# Patient Record
Sex: Male | Born: 1950 | Race: Black or African American | Hispanic: No | Marital: Married | Smoking: Former smoker
Health system: Southern US, Community
[De-identification: ages and names within clinical notes are randomized; demographics above are authoritative.]

## PROBLEM LIST (undated history)

## (undated) DIAGNOSIS — I351 Nonrheumatic aortic (valve) insufficiency: Secondary | ICD-10-CM

## (undated) DIAGNOSIS — I10 Essential (primary) hypertension: Secondary | ICD-10-CM

## (undated) DIAGNOSIS — R569 Unspecified convulsions: Secondary | ICD-10-CM

## (undated) DIAGNOSIS — B1920 Unspecified viral hepatitis C without hepatic coma: Secondary | ICD-10-CM

## (undated) DIAGNOSIS — R51 Headache: Secondary | ICD-10-CM

## (undated) DIAGNOSIS — I639 Cerebral infarction, unspecified: Secondary | ICD-10-CM

## (undated) DIAGNOSIS — I340 Nonrheumatic mitral (valve) insufficiency: Secondary | ICD-10-CM

## (undated) DIAGNOSIS — K8020 Calculus of gallbladder without cholecystitis without obstruction: Secondary | ICD-10-CM

## (undated) DIAGNOSIS — K219 Gastro-esophageal reflux disease without esophagitis: Secondary | ICD-10-CM

## (undated) DIAGNOSIS — F32A Depression, unspecified: Secondary | ICD-10-CM

## (undated) DIAGNOSIS — IMO0001 Reserved for inherently not codable concepts without codable children: Secondary | ICD-10-CM

## (undated) DIAGNOSIS — F329 Major depressive disorder, single episode, unspecified: Secondary | ICD-10-CM

## (undated) DIAGNOSIS — R519 Headache, unspecified: Secondary | ICD-10-CM

## (undated) DIAGNOSIS — I071 Rheumatic tricuspid insufficiency: Secondary | ICD-10-CM

## (undated) DIAGNOSIS — D1809 Hemangioma of other sites: Secondary | ICD-10-CM

## (undated) DIAGNOSIS — I5022 Chronic systolic (congestive) heart failure: Secondary | ICD-10-CM

## (undated) DIAGNOSIS — E46 Unspecified protein-calorie malnutrition: Secondary | ICD-10-CM

## (undated) DIAGNOSIS — Z953 Presence of xenogenic heart valve: Secondary | ICD-10-CM

## (undated) DIAGNOSIS — N200 Calculus of kidney: Secondary | ICD-10-CM

## (undated) HISTORY — PX: ROTATOR CUFF REPAIR: SHX139

## (undated) HISTORY — PX: OTHER SURGICAL HISTORY: SHX169

## (undated) HISTORY — PX: FRACTURE SURGERY: SHX138

## (undated) HISTORY — PX: LITHOTRIPSY: SUR834

---

## 2009-10-11 ENCOUNTER — Inpatient Hospital Stay (HOSPITAL_COMMUNITY): Admission: EM | Admit: 2009-10-11 | Discharge: 2009-10-14 | Payer: Self-pay | Admitting: Emergency Medicine

## 2010-11-26 LAB — CBC
HCT: 41.6 % (ref 39.0–52.0)
HCT: 42.4 % (ref 39.0–52.0)
Hemoglobin: 14.4 g/dL (ref 13.0–17.0)
MCHC: 33.3 g/dL (ref 30.0–36.0)
MCV: 94.3 fL (ref 78.0–100.0)
MCV: 95.6 fL (ref 78.0–100.0)
Platelets: 182 10*3/uL (ref 150–400)
RBC: 4.26 MIL/uL (ref 4.22–5.81)
RBC: 4.35 MIL/uL (ref 4.22–5.81)
RBC: 4.51 MIL/uL (ref 4.22–5.81)
RDW: 13.9 % (ref 11.5–15.5)
RDW: 14.4 % (ref 11.5–15.5)
WBC: 8.7 10*3/uL (ref 4.0–10.5)
WBC: 8.9 10*3/uL (ref 4.0–10.5)

## 2010-11-26 LAB — POCT I-STAT, CHEM 8
Calcium, Ion: 1.07 mmol/L — ABNORMAL LOW (ref 1.12–1.32)
Chloride: 104 mEq/L (ref 96–112)
Hemoglobin: 17 g/dL (ref 13.0–17.0)
Potassium: 3.2 mEq/L — ABNORMAL LOW (ref 3.5–5.1)

## 2010-11-26 LAB — BASIC METABOLIC PANEL
BUN: 7 mg/dL (ref 6–23)
CO2: 29 mEq/L (ref 19–32)
Calcium: 8.8 mg/dL (ref 8.4–10.5)
Calcium: 9.1 mg/dL (ref 8.4–10.5)
Chloride: 100 mEq/L (ref 96–112)
Chloride: 98 mEq/L (ref 96–112)
Creatinine, Ser: 0.76 mg/dL (ref 0.4–1.5)
Creatinine, Ser: 0.93 mg/dL (ref 0.4–1.5)
GFR calc Af Amer: >60 mL/min (ref >60)
Glucose, Bld: 120 mg/dL — ABNORMAL HIGH (ref 70–99)
Potassium: 3.2 mEq/L — ABNORMAL LOW (ref 3.5–5.1)
Potassium: 3.9 mEq/L (ref 3.5–5.1)
Sodium: 137 mEq/L (ref 135–145)

## 2011-03-20 ENCOUNTER — Emergency Department (HOSPITAL_COMMUNITY)
Admission: EM | Admit: 2011-03-20 | Discharge: 2011-03-20 | Disposition: A | Discharge status: Home / Self Care | Payer: Self-pay | Attending: Emergency Medicine | Admitting: Emergency Medicine

## 2011-03-20 ENCOUNTER — Emergency Department (HOSPITAL_COMMUNITY): Payer: Self-pay

## 2011-03-20 DIAGNOSIS — R220 Localized swelling, mass and lump, head: Secondary | ICD-10-CM | POA: Insufficient documentation

## 2011-03-20 DIAGNOSIS — R599 Enlarged lymph nodes, unspecified: Secondary | ICD-10-CM | POA: Insufficient documentation

## 2011-03-20 DIAGNOSIS — R221 Localized swelling, mass and lump, neck: Secondary | ICD-10-CM | POA: Insufficient documentation

## 2011-03-20 DIAGNOSIS — J029 Acute pharyngitis, unspecified: Secondary | ICD-10-CM | POA: Insufficient documentation

## 2011-03-20 DIAGNOSIS — I10 Essential (primary) hypertension: Secondary | ICD-10-CM | POA: Insufficient documentation

## 2011-03-20 LAB — DIFFERENTIAL
Basophils Relative: 0 % (ref 0–1)
Eosinophils Absolute: 0 10*3/uL (ref 0.0–0.7)
Eosinophils Relative: 0 % (ref 0–5)
Lymphs Abs: 1.9 10*3/uL (ref 0.7–4.0)

## 2011-03-20 LAB — CBC
MCHC: 34.5 g/dL (ref 30.0–36.0)
MCV: 87.8 fL (ref 78.0–100.0)
Platelets: 248 10*3/uL (ref 150–400)
WBC: 18.9 10*3/uL — ABNORMAL HIGH (ref 4.0–10.5)

## 2011-03-20 LAB — BASIC METABOLIC PANEL
BUN: 13 mg/dL (ref 6–23)
CO2: 30 mEq/L (ref 19–32)
GFR calc non Af Amer: >60 mL/min (ref >60)
Glucose, Bld: 89 mg/dL (ref 70–99)
Potassium: 4.1 mEq/L (ref 3.5–5.1)
Sodium: 139 mEq/L (ref 135–145)

## 2011-03-21 LAB — STREP A DNA PROBE: Group A Strep Probe: POSITIVE

## 2014-05-14 ENCOUNTER — Emergency Department (HOSPITAL_COMMUNITY): Payer: Self-pay

## 2014-05-14 ENCOUNTER — Emergency Department (HOSPITAL_COMMUNITY)
Admission: EM | Admit: 2014-05-14 | Discharge: 2014-05-14 | Discharge status: Against Medical Advice | Payer: Self-pay | Attending: Emergency Medicine | Admitting: Emergency Medicine

## 2014-05-14 ENCOUNTER — Encounter (HOSPITAL_COMMUNITY): Payer: Self-pay | Admitting: Emergency Medicine

## 2014-05-14 DIAGNOSIS — R0602 Shortness of breath: Secondary | ICD-10-CM | POA: Insufficient documentation

## 2014-05-14 DIAGNOSIS — R51 Headache: Secondary | ICD-10-CM | POA: Insufficient documentation

## 2014-05-14 DIAGNOSIS — R079 Chest pain, unspecified: Secondary | ICD-10-CM | POA: Insufficient documentation

## 2014-05-14 DIAGNOSIS — I10 Essential (primary) hypertension: Secondary | ICD-10-CM | POA: Insufficient documentation

## 2014-05-14 HISTORY — DX: Unspecified viral hepatitis C without hepatic coma: B19.20

## 2014-05-14 HISTORY — DX: Essential (primary) hypertension: I10

## 2014-05-14 LAB — BASIC METABOLIC PANEL
Anion gap: 16 — ABNORMAL HIGH (ref 5–15)
BUN: 11 mg/dL (ref 6–23)
CALCIUM: 9.1 mg/dL (ref 8.4–10.5)
CO2: 19 mEq/L (ref 19–32)
CREATININE: 1 mg/dL (ref 0.50–1.35)
Chloride: 106 mEq/L (ref 96–112)
GFR calc non Af Amer: 78 mL/min — ABNORMAL LOW (ref >90)
Glucose, Bld: 91 mg/dL (ref 70–99)
Potassium: 4.8 mEq/L (ref 3.7–5.3)
Sodium: 141 mEq/L (ref 137–147)

## 2014-05-14 LAB — I-STAT TROPONIN, ED: Troponin i, poc: 0.02 ng/mL (ref 0.00–0.08)

## 2014-05-14 LAB — CBC
HCT: 40 % (ref 39.0–52.0)
Hemoglobin: 13.9 g/dL (ref 13.0–17.0)
MCH: 30.8 pg (ref 26.0–34.0)
MCHC: 34.8 g/dL (ref 30.0–36.0)
MCV: 88.5 fL (ref 78.0–100.0)
PLATELETS: 232 10*3/uL (ref 150–400)
RBC: 4.52 MIL/uL (ref 4.22–5.81)
RDW: 14.7 % (ref 11.5–15.5)
WBC: 6.1 10*3/uL (ref 4.0–10.5)

## 2014-05-14 LAB — PRO B NATRIURETIC PEPTIDE: Pro B Natriuretic peptide (BNP): 4107 pg/mL — ABNORMAL HIGH (ref 0–125)

## 2014-05-14 NOTE — ED Notes (Signed)
Pt called again from triage, still no answer

## 2014-05-14 NOTE — ED Notes (Signed)
Pt called from triage, no answer 

## 2014-05-14 NOTE — ED Notes (Signed)
Pt c/o chest pain and shob times 2 days, esp when he lays down at night.  Pt states that last night he vomited.

## 2015-10-08 DIAGNOSIS — I639 Cerebral infarction, unspecified: Secondary | ICD-10-CM

## 2015-10-08 HISTORY — DX: Cerebral infarction, unspecified: I63.9

## 2015-10-15 ENCOUNTER — Emergency Department (HOSPITAL_COMMUNITY): Payer: Medicare Other

## 2015-10-15 ENCOUNTER — Inpatient Hospital Stay (HOSPITAL_COMMUNITY)
Admission: EM | Admit: 2015-10-15 | Discharge: 2015-10-18 | DRG: 291 | Disposition: A | Discharge status: Home / Self Care | Payer: Medicare Other | Attending: Internal Medicine | Admitting: Internal Medicine

## 2015-10-15 ENCOUNTER — Inpatient Hospital Stay (HOSPITAL_COMMUNITY): Payer: Medicare Other

## 2015-10-15 ENCOUNTER — Encounter (HOSPITAL_COMMUNITY): Payer: Self-pay | Admitting: Neurology

## 2015-10-15 DIAGNOSIS — J9601 Acute respiratory failure with hypoxia: Secondary | ICD-10-CM | POA: Diagnosis not present

## 2015-10-15 DIAGNOSIS — E785 Hyperlipidemia, unspecified: Secondary | ICD-10-CM | POA: Diagnosis not present

## 2015-10-15 DIAGNOSIS — G44029 Chronic cluster headache, not intractable: Secondary | ICD-10-CM | POA: Insufficient documentation

## 2015-10-15 DIAGNOSIS — I110 Hypertensive heart disease with heart failure: Principal | ICD-10-CM | POA: Diagnosis present

## 2015-10-15 DIAGNOSIS — E43 Unspecified severe protein-calorie malnutrition: Secondary | ICD-10-CM | POA: Diagnosis present

## 2015-10-15 DIAGNOSIS — Z7982 Long term (current) use of aspirin: Secondary | ICD-10-CM | POA: Diagnosis not present

## 2015-10-15 DIAGNOSIS — I639 Cerebral infarction, unspecified: Secondary | ICD-10-CM | POA: Diagnosis present

## 2015-10-15 DIAGNOSIS — I509 Heart failure, unspecified: Secondary | ICD-10-CM

## 2015-10-15 DIAGNOSIS — B182 Chronic viral hepatitis C: Secondary | ICD-10-CM | POA: Insufficient documentation

## 2015-10-15 DIAGNOSIS — Z23 Encounter for immunization: Secondary | ICD-10-CM

## 2015-10-15 DIAGNOSIS — I739 Peripheral vascular disease, unspecified: Secondary | ICD-10-CM | POA: Diagnosis not present

## 2015-10-15 DIAGNOSIS — B1920 Unspecified viral hepatitis C without hepatic coma: Secondary | ICD-10-CM | POA: Diagnosis present

## 2015-10-15 DIAGNOSIS — G44009 Cluster headache syndrome, unspecified, not intractable: Secondary | ICD-10-CM | POA: Diagnosis present

## 2015-10-15 DIAGNOSIS — R300 Dysuria: Secondary | ICD-10-CM | POA: Diagnosis not present

## 2015-10-15 DIAGNOSIS — I10 Essential (primary) hypertension: Secondary | ICD-10-CM | POA: Insufficient documentation

## 2015-10-15 DIAGNOSIS — R197 Diarrhea, unspecified: Secondary | ICD-10-CM

## 2015-10-15 DIAGNOSIS — J449 Chronic obstructive pulmonary disease, unspecified: Secondary | ICD-10-CM | POA: Diagnosis not present

## 2015-10-15 DIAGNOSIS — E46 Unspecified protein-calorie malnutrition: Secondary | ICD-10-CM | POA: Diagnosis present

## 2015-10-15 DIAGNOSIS — Z8249 Family history of ischemic heart disease and other diseases of the circulatory system: Secondary | ICD-10-CM | POA: Diagnosis not present

## 2015-10-15 DIAGNOSIS — I6330 Cerebral infarction due to thrombosis of unspecified cerebral artery: Secondary | ICD-10-CM | POA: Diagnosis not present

## 2015-10-15 DIAGNOSIS — F1290 Cannabis use, unspecified, uncomplicated: Secondary | ICD-10-CM | POA: Diagnosis not present

## 2015-10-15 DIAGNOSIS — Z7902 Long term (current) use of antithrombotics/antiplatelets: Secondary | ICD-10-CM | POA: Diagnosis not present

## 2015-10-15 DIAGNOSIS — R51 Headache: Secondary | ICD-10-CM

## 2015-10-15 DIAGNOSIS — R0602 Shortness of breath: Secondary | ICD-10-CM | POA: Diagnosis present

## 2015-10-15 DIAGNOSIS — Z6820 Body mass index (BMI) 20.0-20.9, adult: Secondary | ICD-10-CM | POA: Diagnosis not present

## 2015-10-15 DIAGNOSIS — I5021 Acute systolic (congestive) heart failure: Secondary | ICD-10-CM | POA: Diagnosis present

## 2015-10-15 DIAGNOSIS — R634 Abnormal weight loss: Secondary | ICD-10-CM | POA: Diagnosis present

## 2015-10-15 DIAGNOSIS — R519 Headache, unspecified: Secondary | ICD-10-CM

## 2015-10-15 DIAGNOSIS — J40 Bronchitis, not specified as acute or chronic: Secondary | ICD-10-CM | POA: Diagnosis present

## 2015-10-15 DIAGNOSIS — R112 Nausea with vomiting, unspecified: Secondary | ICD-10-CM | POA: Diagnosis present

## 2015-10-15 DIAGNOSIS — I5023 Acute on chronic systolic (congestive) heart failure: Secondary | ICD-10-CM

## 2015-10-15 HISTORY — DX: Headache: R51

## 2015-10-15 HISTORY — DX: Headache, unspecified: R51.9

## 2015-10-15 LAB — BASIC METABOLIC PANEL
Anion gap: 11 (ref 5–15)
BUN: 10 mg/dL (ref 6–20)
CALCIUM: 8.9 mg/dL (ref 8.9–10.3)
CO2: 21 mmol/L — AB (ref 22–32)
CREATININE: 0.97 mg/dL (ref 0.61–1.24)
Chloride: 111 mmol/L (ref 101–111)
Glucose, Bld: 100 mg/dL — ABNORMAL HIGH (ref 65–99)
Potassium: 4.4 mmol/L (ref 3.5–5.1)
SODIUM: 143 mmol/L (ref 135–145)

## 2015-10-15 LAB — CBC
HCT: 39.3 % (ref 39.0–52.0)
Hemoglobin: 12.8 g/dL — ABNORMAL LOW (ref 13.0–17.0)
MCH: 29.6 pg (ref 26.0–34.0)
MCHC: 32.6 g/dL (ref 30.0–36.0)
MCV: 90.8 fL (ref 78.0–100.0)
PLATELETS: 233 10*3/uL (ref 150–400)
RBC: 4.33 MIL/uL (ref 4.22–5.81)
RDW: 15.1 % (ref 11.5–15.5)
WBC: 5.6 10*3/uL (ref 4.0–10.5)

## 2015-10-15 LAB — HEPATIC FUNCTION PANEL
ALK PHOS: 87 U/L (ref 38–126)
ALT: 21 U/L (ref 17–63)
AST: 31 U/L (ref 15–41)
Albumin: 3.7 g/dL (ref 3.5–5.0)
Bilirubin, Direct: 0.4 mg/dL (ref 0.1–0.5)
Indirect Bilirubin: 0.9 mg/dL (ref 0.3–0.9)
TOTAL PROTEIN: 7.4 g/dL (ref 6.5–8.1)
Total Bilirubin: 1.3 mg/dL — ABNORMAL HIGH (ref 0.3–1.2)

## 2015-10-15 LAB — PROTIME-INR
INR: 1.13 (ref 0.00–1.49)
PROTHROMBIN TIME: 14.7 s (ref 11.6–15.2)

## 2015-10-15 LAB — AMMONIA: AMMONIA: 28 umol/L (ref 9–35)

## 2015-10-15 LAB — BRAIN NATRIURETIC PEPTIDE: B Natriuretic Peptide: 2457 pg/mL — ABNORMAL HIGH (ref 0.0–100.0)

## 2015-10-15 LAB — I-STAT TROPONIN, ED: Troponin i, poc: 0.02 ng/mL (ref 0.00–0.08)

## 2015-10-15 LAB — TROPONIN I: Troponin I: 0.03 ng/mL (ref <0.031)

## 2015-10-15 LAB — APTT: aPTT: 34 seconds (ref 24–37)

## 2015-10-15 MED ORDER — FUROSEMIDE 10 MG/ML IJ SOLN
20.0000 mg | Freq: Every day | INTRAMUSCULAR | Status: DC
  Administered 2015-10-16 – 2015-10-18 (×3): 20 mg via INTRAVENOUS
  Filled 2015-10-15 (×3): qty 2

## 2015-10-15 MED ORDER — MORPHINE SULFATE (PF) 4 MG/ML IV SOLN
4.0000 mg | Freq: Once | INTRAVENOUS | Status: AC
  Administered 2015-10-15: 4 mg via INTRAVENOUS
  Filled 2015-10-15: qty 1

## 2015-10-15 MED ORDER — ENOXAPARIN SODIUM 40 MG/0.4ML ~~LOC~~ SOLN
40.0000 mg | SUBCUTANEOUS | Status: DC
  Administered 2015-10-15 – 2015-10-17 (×3): 40 mg via SUBCUTANEOUS
  Filled 2015-10-15 (×3): qty 0.4

## 2015-10-15 MED ORDER — AZITHROMYCIN 250 MG PO TABS
250.0000 mg | ORAL_TABLET | Freq: Every day | ORAL | Status: DC
  Administered 2015-10-16 – 2015-10-18 (×3): 250 mg via ORAL
  Filled 2015-10-15 (×3): qty 1

## 2015-10-15 MED ORDER — SODIUM CHLORIDE 0.9% FLUSH: 3.0000 mL | INTRAVENOUS | Status: DC | PRN

## 2015-10-15 MED ORDER — SODIUM CHLORIDE 0.9% FLUSH
3.0000 mL | Freq: Two times a day (BID) | INTRAVENOUS | Status: DC
  Administered 2015-10-15 – 2015-10-18 (×6): 3 mL via INTRAVENOUS

## 2015-10-15 MED ORDER — AZITHROMYCIN 500 MG PO TABS
500.0000 mg | ORAL_TABLET | Freq: Every day | ORAL | Status: AC
  Administered 2015-10-15: 500 mg via ORAL
  Filled 2015-10-15: qty 1

## 2015-10-15 MED ORDER — ALBUTEROL SULFATE (2.5 MG/3ML) 0.083% IN NEBU: 2.5000 mg | INHALATION_SOLUTION | RESPIRATORY_TRACT | Status: DC | PRN

## 2015-10-15 MED ORDER — BUTALBITAL-APAP-CAFFEINE 50-325-40 MG PO TABS
1.0000 | ORAL_TABLET | Freq: Two times a day (BID) | ORAL | Status: DC | PRN
  Administered 2015-10-16 – 2015-10-17 (×4): 1 via ORAL
  Filled 2015-10-15 (×4): qty 1

## 2015-10-15 MED ORDER — GADOBENATE DIMEGLUMINE 529 MG/ML IV SOLN
10.0000 mL | Freq: Once | INTRAVENOUS | Status: AC | PRN
  Administered 2015-10-15: 10 mL via INTRAVENOUS

## 2015-10-15 MED ORDER — ASPIRIN EC 81 MG PO TBEC: 81.0000 mg | DELAYED_RELEASE_TABLET | Freq: Every day | ORAL | Status: DC

## 2015-10-15 MED ORDER — MORPHINE SULFATE (PF) 2 MG/ML IV SOLN
2.0000 mg | INTRAVENOUS | Status: DC | PRN
Start: 2015-10-15 — End: 2015-10-18
  Administered 2015-10-15 – 2015-10-16 (×2): 2 mg via INTRAVENOUS
  Filled 2015-10-15 (×2): qty 1

## 2015-10-15 MED ORDER — HYDRALAZINE HCL 20 MG/ML IJ SOLN
5.0000 mg | INTRAMUSCULAR | Status: DC | PRN
  Administered 2015-10-15: 5 mg via INTRAVENOUS
  Filled 2015-10-15: qty 1

## 2015-10-15 MED ORDER — HYDROXYZINE HCL 50 MG/ML IM SOLN: 25.0000 mg | Freq: Four times a day (QID) | INTRAMUSCULAR | Status: DC | PRN

## 2015-10-15 MED ORDER — ONDANSETRON HCL 4 MG/2ML IJ SOLN
4.0000 mg | Freq: Once | INTRAMUSCULAR | Status: AC
  Administered 2015-10-15: 4 mg via INTRAVENOUS
  Filled 2015-10-15: qty 2

## 2015-10-15 MED ORDER — SODIUM CHLORIDE 0.9 % IV SOLN: 250.0000 mL | INTRAVENOUS | Status: DC | PRN

## 2015-10-15 MED ORDER — IOHEXOL 350 MG/ML SOLN
100.0000 mL | Freq: Once | INTRAVENOUS | Status: AC | PRN
  Administered 2015-10-15: 100 mL via INTRAVENOUS

## 2015-10-15 MED ORDER — DM-GUAIFENESIN ER 30-600 MG PO TB12
1.0000 | ORAL_TABLET | Freq: Two times a day (BID) | ORAL | Status: DC
  Administered 2015-10-15 – 2015-10-18 (×6): 1 via ORAL
  Filled 2015-10-15 (×6): qty 1

## 2015-10-15 MED ORDER — ENSURE ENLIVE PO LIQD
237.0000 mL | Freq: Two times a day (BID) | ORAL | Status: DC
  Administered 2015-10-16 (×2): 237 mL via ORAL

## 2015-10-15 MED ORDER — IRBESARTAN 300 MG PO TABS
300.0000 mg | ORAL_TABLET | Freq: Every day | ORAL | Status: DC
  Filled 2015-10-15: qty 1

## 2015-10-15 MED ORDER — FUROSEMIDE 10 MG/ML IJ SOLN
20.0000 mg | Freq: Once | INTRAMUSCULAR | Status: AC
  Administered 2015-10-15: 20 mg via INTRAVENOUS
  Filled 2015-10-15: qty 2

## 2015-10-15 MED ORDER — ACETAMINOPHEN 325 MG PO TABS: 650.0000 mg | ORAL_TABLET | ORAL | Status: DC | PRN

## 2015-10-15 NOTE — ED Notes (Signed)
Patient transported to CT 

## 2015-10-15 NOTE — ED Notes (Signed)
[place], [place] on phone with Lydia Guiles, Therapist, sports.

## 2015-10-15 NOTE — ED Notes (Signed)
Per ems- pt reports intermittent sob for several years, yesterday developed "squeezing"CP. Today n/v/d, also h/a, has not been taking lisinopril b/c he doesn't like how it makes him feel. Given 324 aspirin, 1 nitro. Denies cp at current. C/o h/a but has cluster h/as. Has has weight loss of 35 lbs over 3 months.

## 2015-10-15 NOTE — ED Notes (Signed)
Patient transported to X-ray 

## 2015-10-15 NOTE — H&P (Addendum)
[facility] History and Physical  Kern Aho [id number] DOB: 14-Mar-1951 DOA: 10/15/2015  Referring physician: ED physician PCP: No PCP Per Patient  Specialists:   Chief Complaint: Shortness of breath, cough, chest pain, headache, nausea, vomiting, diarrhea, dysuria  HPI: Alexis [name] is a 65 y.o. male with PMH of hypertension, hepatitis C, cluster headache, who presents with shortness of breath, chest pain, cough, headache, nausea, vomiting, diarrhea, dysuria.  Patient reports that he has intermittent shortness of breath for years, which has worsened recently. She has cough with yellow colored sputum production. He reports that he started having squeezing chest pain over left lower chest yesterdady. Initially chest pain was 10 out of 10 in severity, currently 2 out of 10 in severity. He feels that deep breath makes chest pain like " blockage in his right side of throat".  Patient reports that he has intermittent headache due to cluster headache after he came back from [place]. He is taking Fioicet for his HA. Currently he has headache on left side of his head, 5 out of 10 in severity now. No unilateral weakness, numbness or tingling sensations in his extremities. No vision change or hearing loss.  Patient reports that he has been having nausea, vomiting, diarrhea for 2 weeks. He lost 35 pounds over past 3 months. He had 5 bowel movement with loose stool and vomited 3 times today. No abdominal pain. He states that he was treated for UTI with antibiotics 2 weeks ago. He states that he has mild dysuria, but no burning on urination or increased urinary frequency. Regarding his hypertension, he states that he was on lisinopril, but stopped taking it for a while because of side effects of decreasing sexual function.  In ED, patient was found to have BNP 2457, WBC 5.6, temperature normal, oxygen desaturation to 88% on room air, electrolyte and renal function okay, INR 1.13, troponin  negative. Patient admitted to inpatient for further evaluation and treatment.  # CXR showed COPD changes with new opacity in LEFT mid lung 5.3 x 3.0 cm in size; mass not excluded.  # CTA of chest: single somewhat linear appearing questionable filling defect in a LEFT lower lobe pulmonary artery, favor artifact or sequela of remote pulmonary embolism, no typical appearance of an acute pulmonary embolus per radiologist; enlargement of cardiac chambers with peribronchial thickening with central infiltrates particularly in the lower lobes raising question of pulmonary edema; small BILATERAL pleural effusions with loculation of fluid in the LEFT major fissure.   # CT-head showed no acute stroke or hemorrhage, but showed asymmetric lateral bulging LEFT cavernous sinus with unclear diagnosis.  EKG: Independently reviewed. QTC 501, poor R-wave progression, mild T-wave inversion in aVL and V6.  Where does patient live?   At home    Can patient participate in ADLs?  Yes  Review of Systems:   General: no fevers, chills, has loss of body weight, has poor appetite, has fatigue HEENT: no blurry vision, hearing changes or sore throat Pulm: has dyspnea, coughing, no wheezing CV: no chest pain, palpitations Abd: has nausea, vomiting, diarrhea, no constipation and abdominal pain, GU: has dysuria, no burning on urination, increased urinary frequency, hematuria  Ext: no leg edema Neuro: no unilateral weakness, numbness, or tingling, no vision change or hearing loss. Has HA. Skin: no rash MSK: No muscle spasm, no deformity, no limitation of range of movement in spin Heme: No easy bruising.  Travel history: No recent long distant travel.  Allergy:  Allergies  Allergen Reactions  .  Lisinopril     Decrease sexual function    Past Medical History  Diagnosis Date  . Hepatitis C   . Hypertension     Past Surgical History  Procedure Laterality Date  . Fracture surgery      Social History:   reports that he has never smoked. He does not have any smokeless tobacco history on file. He reports that he drinks alcohol. He reports that he uses illicit drugs (Marijuana).  Family History:  Family History  Problem Relation Age of Onset  . Breast cancer Mother   . Aneurysm Mother     Brain aneurysm  . Hypertension Father   . Cancer Father     Pancreatic cancer  . Hypertension Brother      Prior to Admission medications   Medication Sig Start Date End Date Taking? Authorizing Provider  albuterol (PROVENTIL HFA;VENTOLIN HFA) 108 (90 Base) MCG/ACT inhaler Inhale 1 puff into the lungs every 6 (six) hours as needed for wheezing or shortness of breath.   Yes Historical Provider, MD  aspirin EC 81 MG tablet Take 81 mg by mouth daily.   Yes Historical Provider, MD  butalbital-acetaminophen-caffeine (FIORICET, ESGIC) 50-325-40 MG tablet Take 1 tablet by mouth 2 (two) times daily as needed for headache.   Yes Historical Provider, MD  lisinopril (PRINIVIL,ZESTRIL) 40 MG tablet Take 40 mg by mouth daily.   Yes Historical Provider, MD    Physical Exam: Filed Vitals:   10/15/15 1815 10/15/15 1845 10/15/15 2046 10/15/15 2056  BP: 171/81 167/76  176/70  Pulse: 78 78  84  Temp:    97.6 F (36.4 C)  TempSrc:    Oral  Resp: 13 13  20   Height:   5\' 4"  (1.626 m)   Weight:   54.114 kg (119 lb 4.8 oz)   SpO2: 96% 97%  92%   General: Not in acute distress. HEENT:       Eyes: PERRL, EOMI, no scleral icterus.       ENT: No discharge from the ears and nose, no pharynx injection, no tonsillar enlargement.        Neck: positive JVD, no bruit, no mass felt. Heme: No neck lymph node enlargement. Cardiac: S1/S2, RRR, No murmurs, No gallops or rubs. Pulm: No rales, wheezing, rhonchi or rubs. Abd: Soft, nondistended, nontender, no rebound pain, no organomegaly, BS present. Ext: No pitting leg edema bilaterally. 2+DP/PT pulse bilaterally. Musculoskeletal: No joint deformities, No joint redness or  warmth, no limitation of ROM in spin. Skin: No rashes.  Neuro: Alert, oriented X3, cranial nerves II-XII grossly intact, muscle strength 5/5 in all extremities, sensation to light touch intact. Knee reflex 1+ bilaterally. Negative Babinski's sign. Normal finger to nose test. Psych: Patient is not psychotic, no suicidal or hemocidal ideation.  Labs on Admission:  Basic Metabolic Panel:  Recent Labs Lab 10/15/15 1420  NA 143  K 4.4  CL 111  CO2 21*  GLUCOSE 100*  BUN 10  CREATININE 0.97  CALCIUM 8.9   Liver Function Tests: No results for input(s): AST, ALT, ALKPHOS, BILITOT, PROT, ALBUMIN in the last 168 hours. No results for input(s): LIPASE, AMYLASE in the last 168 hours. No results for input(s): AMMONIA in the last 168 hours. CBC:  Recent Labs Lab 10/15/15 1420  WBC 5.6  HGB 12.8*  HCT 39.3  MCV 90.8  PLT 233   Cardiac Enzymes: No results for input(s): CKTOTAL, CKMB, CKMBINDEX, TROPONINI in the last 168 hours.  BNP (last 3 results)  Recent Labs  10/15/15 1420  BNP 2457.0*    ProBNP (last 3 results) No results for input(s): PROBNP in the last 8760 hours.  CBG: No results for input(s): GLUCAP in the last 168 hours.  Radiological Exams on Admission: Dg Chest 2 View  10/15/2015  CLINICAL DATA:  Intermittent shortness of breath for several years, developed squeezing chest pain yesterday, today with nausea, vomiting, diarrhea, and headache, history hepatitis-C, hypertension EXAM: CHEST  2 VIEW COMPARISON:  05/14/2014 FINDINGS: Enlargement of cardiac silhouette with pulmonary vascular congestion. Tortuosity of thoracic aorta. Emphysematous lungs with central peribronchial thickening. New opacity in mid LEFT lung, 5.3 x 3.0 cm, mass not excluded. Subsegmental atelectasis at both lung bases. No definite acute infiltrate or edema. Tiny bibasilar effusions. Diffuse osseous demineralization without acute bony abnormalities. Orthopedic screws at RIGHT humeral head.  IMPRESSION: COPD changes with new opacity in LEFT mid lung 5.3 x 3.0 cm in size; mass not excluded. CT chest with contrast recommended for further evaluation. Enlargement of cardiac silhouette. Bibasilar atelectasis and tiny pleural effusions. Electronically Signed   By: Lavonia Dana M.D.   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 Lower extremity weakness earlier in the week. Patient is anticoagulated. History of hepatitis and hypertension. EXAM: CT HEAD WITHOUT CONTRAST TECHNIQUE: Contiguous axial images were obtained from the base of the skull through the vertex without intravenous contrast. COMPARISON:  None. FINDINGS: No evidence for acute infarction, hemorrhage, intra-axial mass lesion, hydrocephalus, or extra-axial fluid. Mild cerebral and cerebellar atrophy. Slight hypoattenuation of white matter suggesting small vessel disease. Calvarium intact. Along the lateral margin of the LEFT cavernous sinus, the wall bulges outwardly and there is modest hyperattenuation. It is unclear if this is artifactual asymmetry from partial volume averaging, dolichoectasia or aneurysmal dilatation of the LEFT internal carotid artery, or a cavernous sinus mass such as a schwannoma, meningioma, or even metastasis. No osseous destructive process is evident. In the setting of a patient with history of grand mal seizures, and proximity to the medial LEFT temporal lobe, consider MRI brain without and with contrast. No sinus or mastoid disease.  Negative orbits. IMPRESSION: Mild atrophy and small vessel disease. No acute stroke or hemorrhage. Asymmetric lateral bulging LEFT cavernous sinus; see discussion above. Consider MRI brain without and with contrast for further evaluation. Electronically Signed   By: Staci Righter M.D.   On: 10/15/2015 17:09   Ct Angio Chest Pe W/cm &/or Wo Cm  10/15/2015  CLINICAL DATA:  LEFT side chest pain and shortness of breath,  ongoing but increased 1 week ago, history of hypertension, hepatitis-C, anxiety EXAM: CT ANGIOGRAPHY CHEST WITH CONTRAST TECHNIQUE: Multidetector CT imaging of the chest was performed using the standard protocol during bolus administration of intravenous contrast. Multiplanar CT image reconstructions and MIPs were obtained to evaluate the vascular anatomy. CONTRAST:  148mL OMNIPAQUE IOHEXOL 350 MG/ML SOLN IV COMPARISON:  None FINDINGS: Aorta normal caliber without aneurysm or dissection. Calcified 14 mm diameter gallstone within gallbladder. Remaining visualized upper abdomen unremarkable. Scattered streak artifacts traversing the lower lobes due to inclusion of patient's RIGHT arm within the imaged field. Enlargement of cardiac chambers diffusely. Pulmonary arteries well opacified and patent. Single questionable somewhat linear appearing filling defect is seen adjacent to the wall of a LEFT lower lobe pulmonary artery, series 406, image 168, question artifact or less likely a sequela of a prior pulmonary embolus such as a web or synechiae. This  does not have the typical appearance of an acute pulmonary embolus. No other pulmonary arterial perfusion abnormalities are identified. Small BILATERAL pleural effusions. Loculated pleural fluid within the LEFT major fissure, accounting for abnormal chest radiograph finding of questionable mid lung opacity. Peribronchial thickening with question central pulmonary infiltrates in both lower lobes, favor edema over infection. No pneumothorax or acute osseous findings. Review of the MIP images confirms the above findings. IMPRESSION: Single somewhat linear appearing questionable filling defect in a LEFT lower lobe pulmonary artery, favor artifact or sequela of remote pulmonary embolism ; this does not have the typical appearance of an acute pulmonary embolus. Remaining pulmonary arteries are normal in appearance. Small BILATERAL pleural effusions with loculation of fluid in the  LEFT major fissure accounting for chest radiographic abnormality. Enlargement of cardiac chambers with peribronchial thickening with central infiltrates particularly in the lower lobes raising question of pulmonary edema. Electronically Signed   By: Lavonia Dana M.D.   On: 10/15/2015 17:31    Assessment/Plan Principal Problem:   Acute respiratory failure with hypoxia (HCC) Active Problems:   Hepatitis C   Hypertension   Acute CHF (congestive heart failure) (HCC)   Nausea vomiting and diarrhea   Shortness of breath   Headache  Acute respiratory failure with hypoxia [facility]): this is likely combination of acute CHF given elevated BNP and positive JVD,  and possible bronchitis given productive cough. CXR showed COPD changes with new opacity in LEFT mid lung, but CTA of chest did not showed mass. The single linear appearing questionable filling defect in a LEFT lower lobe pulmonary artery, favor artifact or sequela of remote pulmonary embolism, no typical appearance of an acute pulmonary embolus per radiologist. I will not start blood thinner now.  -will admit to tele bed -Will treat pt for acute CHF and possible bronchitis as below -May consult pulmonologist in the morning  Acute CHF: Patient has an elevated BNP and the positive JVD, consistent with acute CHF. In addition, he has not taken his lisinopril for a while. His blood pressure is elevated at 167/76 on admission, indicating possible diastolic congestive heart failure from uncontrolled blood pressure. -Lasix 20 mg daily (he is lasix nave) -trop x 3 -2d echo -Risk factor stratification: A1c, FLP -will continue home  ASA -switch lisinopril to irbesartan -Daily weights -strict I/O's -Low salt diet -No BB due to acute decompensation of CHF -UDS and HIV ab  Possible bronchitis: CT angiogram of chest did not show pneumonia. patient has productive cough with yellow colored sputum production, indicating possible bronchitis. Patient does not  have fever, chills, not septic on admission. -Nebulizers:  prn albuterol -Oral azithromycin for 5 days.  -Mucinex for cough  -Follow up sputum culture  HTN:  -switch  lisinopril to irbesartan -IV hydralazine when necessary  Nausea vomiting and diarrhea: etiology is not clear. No abdominal pain. No fever, chills, leukocytosis. Not septic on admission. Given recent history of antibiotic use, need to rule out C. Difficile colitis. -When necessary hydroxyzine for nausea -C. Difficile PCR and GI pathogen panel  Hx of Hepatitis C: no signs of hepatic encephalopathy. No abdominal pain -check LFT -f/u with PCP -check ammonia level  Headache: likely due to cluster headache. CT had showed abnormalities in the left cavernous sinus with unclear diagnosis. 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 -will consult to neurology -  will get MRA of the brain without contrast  - PT consult, OT consult, Speech consult  - 2 d Echocardiogram  - Ekg  - Carotid dopplers  - Aspirin  - will d/c irbesartan to allow permissive hypertension - IV hydralazine for SBP>220  Protein calorie more nutrition: Patient lost 35 pounds over 3 months. Etiology is not clear. -will consult to dietitian -Ensure  Dysuria: Patient was treated for UTI with antibiotics 2 weeks ago. He still has dysuria. -Urinalysis   DVT ppx: SQ Lovenox  Code Status: Full code Family Communication:   Yes, patient's nephews and niece at bed side Disposition Plan: Admit to inpatient   Date of Service 10/15/2015    Ivor Costa [facility] Pager [phone]  If 7PM-7AM, please contact night-coverage [email or web address] Password [facility] 10/15/2015, 9:47 PM

## 2015-10-16 ENCOUNTER — Encounter (HOSPITAL_COMMUNITY): Payer: Self-pay | Admitting: General Practice

## 2015-10-16 ENCOUNTER — Inpatient Hospital Stay (HOSPITAL_COMMUNITY): Payer: Medicare Other

## 2015-10-16 DIAGNOSIS — R634 Abnormal weight loss: Secondary | ICD-10-CM

## 2015-10-16 DIAGNOSIS — B182 Chronic viral hepatitis C: Secondary | ICD-10-CM

## 2015-10-16 DIAGNOSIS — R112 Nausea with vomiting, unspecified: Secondary | ICD-10-CM

## 2015-10-16 DIAGNOSIS — E46 Unspecified protein-calorie malnutrition: Secondary | ICD-10-CM

## 2015-10-16 DIAGNOSIS — R197 Diarrhea, unspecified: Secondary | ICD-10-CM

## 2015-10-16 DIAGNOSIS — I10 Essential (primary) hypertension: Secondary | ICD-10-CM

## 2015-10-16 DIAGNOSIS — J9601 Acute respiratory failure with hypoxia: Secondary | ICD-10-CM

## 2015-10-16 DIAGNOSIS — I6330 Cerebral infarction due to thrombosis of unspecified cerebral artery: Secondary | ICD-10-CM

## 2015-10-16 DIAGNOSIS — I509 Heart failure, unspecified: Secondary | ICD-10-CM

## 2015-10-16 DIAGNOSIS — R51 Headache: Secondary | ICD-10-CM

## 2015-10-16 DIAGNOSIS — R0602 Shortness of breath: Secondary | ICD-10-CM

## 2015-10-16 DIAGNOSIS — I639 Cerebral infarction, unspecified: Secondary | ICD-10-CM

## 2015-10-16 LAB — LIPID PANEL
CHOL/HDL RATIO: 3.2 ratio
Cholesterol: 159 mg/dL (ref 0–200)
HDL: 49 mg/dL (ref >40)
LDL Cholesterol: 102 mg/dL — ABNORMAL HIGH (ref 0–99)
TRIGLYCERIDES: 41 mg/dL (ref <150)
VLDL: 8 mg/dL (ref 0–40)

## 2015-10-16 LAB — GLUCOSE, CAPILLARY
Glucose-Capillary: 105 mg/dL — ABNORMAL HIGH (ref 65–99)
Glucose-Capillary: 125 mg/dL — ABNORMAL HIGH (ref 65–99)
Glucose-Capillary: 100 mg/dL — ABNORMAL HIGH (ref 65–99)
Glucose-Capillary: 76 mg/dL (ref 65–99)

## 2015-10-16 LAB — BASIC METABOLIC PANEL
ANION GAP: 9 (ref 5–15)
BUN: 9 mg/dL (ref 6–20)
CALCIUM: 8.9 mg/dL (ref 8.9–10.3)
CO2: 28 mmol/L (ref 22–32)
Chloride: 107 mmol/L (ref 101–111)
Creatinine, Ser: 1.04 mg/dL (ref 0.61–1.24)
GFR calc Af Amer: >60 mL/min (ref >60)
GLUCOSE: 105 mg/dL — AB (ref 65–99)
Potassium: 4.2 mmol/L (ref 3.5–5.1)
SODIUM: 144 mmol/L (ref 135–145)

## 2015-10-16 LAB — RAPID URINE DRUG SCREEN, HOSP PERFORMED
Amphetamines: NOT DETECTED
Barbiturates: POSITIVE — AB
Benzodiazepines: NOT DETECTED
COCAINE: NOT DETECTED
OPIATES: POSITIVE — AB
Tetrahydrocannabinol: POSITIVE — AB

## 2015-10-16 LAB — URINALYSIS, ROUTINE W REFLEX MICROSCOPIC
Bilirubin Urine: NEGATIVE
GLUCOSE, UA: NEGATIVE mg/dL
Hgb urine dipstick: NEGATIVE
KETONES UR: NEGATIVE mg/dL
LEUKOCYTES UA: NEGATIVE
NITRITE: NEGATIVE
PROTEIN: NEGATIVE mg/dL
Specific Gravity, Urine: 1.023 (ref 1.005–1.030)
pH: 7 (ref 5.0–8.0)

## 2015-10-16 LAB — TROPONIN I
TROPONIN I: 0.04 ng/mL — AB (ref <0.031)
Troponin I: 0.03 ng/mL (ref <0.031)

## 2015-10-16 LAB — MAGNESIUM: Magnesium: 1.8 mg/dL (ref 1.7–2.4)

## 2015-10-16 LAB — HIV ANTIBODY (ROUTINE TESTING W REFLEX): HIV Screen 4th Generation wRfx: NONREACTIVE

## 2015-10-16 MED ORDER — ASPIRIN 300 MG RE SUPP: 300.0000 mg | Freq: Once | RECTAL | Status: DC

## 2015-10-16 MED ORDER — IOHEXOL 350 MG/ML SOLN
50.0000 mL | Freq: Once | INTRAVENOUS | Status: AC | PRN
  Administered 2015-10-16: 50 mL via INTRAVENOUS

## 2015-10-16 MED ORDER — STROKE: EARLY STAGES OF RECOVERY BOOK
Freq: Once | Status: AC
  Administered 2015-10-16: 04:00:00
  Filled 2015-10-16 (×2): qty 1

## 2015-10-16 MED ORDER — ATORVASTATIN CALCIUM 10 MG PO TABS
10.0000 mg | ORAL_TABLET | Freq: Every day | ORAL | Status: DC
  Administered 2015-10-16 – 2015-10-17 (×2): 10 mg via ORAL
  Filled 2015-10-16 (×2): qty 1

## 2015-10-16 MED ORDER — ZOLPIDEM TARTRATE 5 MG PO TABS
5.0000 mg | ORAL_TABLET | Freq: Once | ORAL | Status: DC
Start: 2015-10-16 — End: 2015-10-18
  Filled 2015-10-16: qty 1

## 2015-10-16 MED ORDER — PNEUMOCOCCAL VAC POLYVALENT 25 MCG/0.5ML IJ INJ
0.5000 mL | INJECTION | INTRAMUSCULAR | Status: AC
  Administered 2015-10-17: 0.5 mL via INTRAMUSCULAR
  Filled 2015-10-16: qty 0.5

## 2015-10-16 MED ORDER — ASPIRIN 300 MG RE SUPP
300.0000 mg | Freq: Every day | RECTAL | Status: DC
  Filled 2015-10-16: qty 1

## 2015-10-16 MED ORDER — PROMETHAZINE HCL 25 MG/ML IJ SOLN
12.5000 mg | Freq: Once | INTRAMUSCULAR | Status: AC
  Administered 2015-10-16: 12.5 mg via INTRAVENOUS
  Filled 2015-10-16: qty 1

## 2015-10-16 MED ORDER — SUMATRIPTAN SUCCINATE 50 MG PO TABS
50.0000 mg | ORAL_TABLET | Freq: Once | ORAL | Status: AC
  Administered 2015-10-16: 50 mg via ORAL
  Filled 2015-10-16: qty 1

## 2015-10-16 MED ORDER — HYDRALAZINE HCL 20 MG/ML IJ SOLN: 5.0000 mg | INTRAMUSCULAR | Status: DC | PRN

## 2015-10-16 MED ORDER — ENSURE ENLIVE PO LIQD
237.0000 mL | Freq: Three times a day (TID) | ORAL | Status: DC
  Administered 2015-10-17 (×3): 237 mL via ORAL

## 2015-10-16 MED ORDER — CLOPIDOGREL BISULFATE 75 MG PO TABS
75.0000 mg | ORAL_TABLET | Freq: Every day | ORAL | Status: DC
  Administered 2015-10-16 – 2015-10-18 (×3): 75 mg via ORAL
  Filled 2015-10-16 (×3): qty 1

## 2015-10-16 MED ORDER — ADULT MULTIVITAMIN W/MINERALS CH
1.0000 | ORAL_TABLET | Freq: Every day | ORAL | Status: DC
  Administered 2015-10-16 – 2015-10-18 (×3): 1 via ORAL
  Filled 2015-10-16 (×3): qty 1

## 2015-10-16 NOTE — Progress Notes (Signed)
[facility]                                                                              Patient Demographics  Alexis [name], is a 65 y.o. male, DOB - [birth year], [id number]  Admit date - 10/15/2015   Admitting Physician Ivor Costa, MD  Outpatient Primary MD for the patient is No PCP Per Patient  LOS - 1   Chief Complaint  Patient presents with  . Shortness of Breath  . Chest Pain      HPI on 10/15/2015 by Dr. Ivor Costa Alexis [name] is a 65 y.o. male with PMH of hypertension, hepatitis C, cluster headache, who presents with shortness of breath, chest pain, cough, headache, nausea, vomiting, diarrhea, dysuria. Patient reports that he has intermittent shortness of breath for years, which has worsened recently. She has cough with yellow colored sputum production. He reports that he started having squeezing chest pain over left lower chest yesterdady. Initially chest pain was 10 out of 10 in severity, currently 2 out of 10 in severity. He feels that deep breath makes chest pain like " blockage in his right side of throat". Patient reports that he has intermittent headache due to cluster headache after he came back from [place]. He is taking Fioicet for his HA. Currently he has headache on left side of his head, 5 out of 10 in severity now. No unilateral weakness, numbness or tingling sensations in his extremities. No vision change or hearing loss. Patient reports that he has been having nausea, vomiting, diarrhea for 2 weeks. He lost 35 pounds over past 3 months. He had 5 bowel movement with loose stool and vomited 3 times today. No abdominal pain. He states that he was treated for UTI with antibiotics 2 weeks ago. He states that he has mild dysuria, but no burning on urination or increased urinary frequency. Regarding his hypertension, he states that he was on lisinopril, but stopped taking it for a while because of side effects of decreasing sexual function. In ED, patient was found to  have BNP 2457, WBC 5.6, temperature normal, oxygen desaturation to 88% on room air, electrolyte and renal function okay, INR 1.13, troponin negative. Patient admitted to inpatient for further evaluation and treatment.  Assessment & Plan   Acute respiratory failure with hypoxia -Likely secondary to bronchitis versus CHF -Chest x-ray showed COPD changes with new opacity in the left midlung -CTA of the chest did not show acute PE; enlargement of the cardiac chambers with peribronchial thickening, question of pulmonary edema. Small bilateral pleural effusions  Acute CHF -BNP 2457 -Echoardiogram pending -No echocardiogram in our system -Monitor intake and output, daily weight -Continue Lasix  Acute CVA -MRI the brain showed 14 mm acute ischemic infarct involving the anterior parasagittal right frontal lobe -Neurology consultation appreciated -Pending echocardiogram, CTA neck and head  -LDL 102 -HbA1c pending -Continue plavix and aspirin  -statin added -PT, OT consulted  Possible bronchitis -Continue nebulizers as needed, Mucinex, azithromycin -CT angiogram of chest did not show pneumonia however patient does have productive cough with yellow sputum  Essential hypertension -Given stroke, allow for permissive hypertension  Nausea and vomiting, diarrhea -Pending C. Difficile -Continue  antiemetics as needed  History of hepatitis C -Continue to follow-up with primary care physician -No signs of hepatic encephalopathy  Headache -Continue pain control  Dysuria -UA negative for infection, patient was treated with antibiotics 2 weeks ago for urinary tract infection  Protein calorie malnutrition -Patient states he's lost 35 pounds in 3 months -Etiology unclear -Nutrition consultation appreciated, continue supplement  Code Status: Full  Family Communication: None at bedside  Disposition Plan: Admitted.   Time Spent in minutes   30 minutes  Procedures  None  Consults     Neurology   DVT Prophylaxis  Lovenox  Lab Results  Component Value Date   PLT 233 10/15/2015    Medications  Scheduled Meds: . atorvastatin  10 mg Oral q1800  . azithromycin  250 mg Oral Daily  . clopidogrel  75 mg Oral Daily  . dextromethorphan-guaiFENesin  1 tablet Oral BID  . enoxaparin (LOVENOX) injection  40 mg Subcutaneous Q24H  . feeding supplement (ENSURE ENLIVE)  237 mL Oral BID BM  . furosemide  20 mg Intravenous Daily  . [START ON 10/17/2015] pneumococcal 23 valent vaccine  0.5 mL Intramuscular Tomorrow-1000  . sodium chloride flush  3 mL Intravenous Q12H  . zolpidem  5 mg Oral Once   Continuous Infusions:  PRN Meds:.sodium chloride, albuterol, butalbital-acetaminophen-caffeine, hydrALAZINE, hydrOXYzine, morphine injection, sodium chloride flush  Antibiotics   Anti-infectives    Start     Dose/Rate Route Frequency Ordered Stop   10/16/15 1000  azithromycin (ZITHROMAX) tablet 250 mg     250 mg Oral Daily 10/15/15 2014 10/20/15 0959   10/15/15 2015  azithromycin (ZITHROMAX) tablet 500 mg     500 mg Oral Daily 10/15/15 2014 10/15/15 2306      Subjective:   Alexis [name] seen and examined today.  Patient states he feeling fine however he knows he just had a stroke. He does complain of headache. Denies any chest pain feels his breathing is improved. Denies cough at this time.  Objective:   Filed Vitals:   10/16/15 0437 10/16/15 0635 10/16/15 0839 10/16/15 1159  BP: 138/49  130/84 148/56  Pulse: 72  72 77  Temp: 97.7 F (36.5 C) 98.4 F (36.9 C)  97.5 F (36.4 C)  TempSrc: Oral   Oral  Resp: 20   18  Height:      Weight: 53.343 kg (117 lb 9.6 oz)     SpO2: 96%  96% 98%    Wt Readings from Last 3 Encounters:  10/16/15 53.343 kg (117 lb 9.6 oz)     Intake/Output Summary (Last 24 hours) at 10/16/15 1350 Last data filed at 10/16/15 0443  Gross per 24 hour  Intake      3 ml  Output    175 ml  Net   -172 ml    Exam  General: Well developed, well  nourished, NAD, appears stated age  HEENT: NCAT, mucous membranes moist.   Cardiovascular: S1 S2 auscultated, no rubs, murmurs or gallops. Regular rate and rhythm.  Respiratory: Clear to auscultation bilaterally with equal chest rise  Abdomen: Soft, nontender, nondistended, + bowel sounds  Extremities: warm dry without cyanosis clubbing or edema  Neuro: AAOx3, cranial nerves grossly intact. Strength 5/5 in patient's upper and lower extremities bilaterally  Psych: Normal affect and demeanor with intact judgement and insight  Data Review   Micro Results No results found for this or any previous visit (from the past 240 hour(s)).  Radiology Reports Ct Angio Head W/cm &/  or Wo Cm  10/16/2015  CLINICAL DATA:  Headache, dizziness, and blurry vision. Acute right anterior frontal lobe parasagittal infarct on MRI. EXAM: CT ANGIOGRAPHY HEAD AND NECK TECHNIQUE: Multidetector CT imaging of the head and neck was performed using the standard protocol during bolus administration of intravenous contrast. Multiplanar CT image reconstructions and MIPs were obtained to evaluate the vascular anatomy. Carotid stenosis measurements (when applicable) are obtained utilizing NASCET criteria, using the distal internal carotid diameter as the denominator. CONTRAST:  35mL OMNIPAQUE IOHEXOL 350 MG/ML SOLN COMPARISON:  Brain MRI 10/15/2015 FINDINGS: CTA NECK Aortic arch: 3 vessel aortic arch. Brachiocephalic and subclavian arteries are widely patent. Right carotid system: Patent without evidence of stenosis, dissection, or significant atherosclerosis. Tortuous and partially retropharyngeal proximal ICA. Left carotid system: Patent without evidence of stenosis, dissection, or significant atherosclerosis. Tortuous distal cervical ICA. Vertebral arteries: Patent without stenosis. Right vertebral artery is minimally larger than the left. Skeleton: Mild lower cervical disc degeneration. Other neck: Partially visualized small  right pleural effusion. Mild asymmetric enlargement of the right palatine tonsil/anterior tonsillar pillar. CTA HEAD Anterior circulation: The internal carotid arteries are patent from skullbase to carotid termini with mild non stenotic calcified plaque. The left cavernous ICA is slightly ectatic. MCAs are patent without evidence of significant proximal stenosis or major branch vessel occlusion. The ACAs are patent without evidence of significant stenosis. The left ACA is dominant. No intracranial aneurysm is identified. Posterior circulation: The vertebral arteries are widely patent to the basilar with the right being mildly dominant. PICA and SCA origins are patent. Basilar artery is patent without stenosis. There is a fetal type origin of the right PCA. A left posterior communicating artery is also present. There is irregularity and mild narrowing of the left P1 segment. Mild PCA branch vessel irregularity is noted bilaterally. Venous sinuses: Patent. Anatomic variants: Fetal type origin of the right PCA. Dominant left ACA. Delayed phase: A small focus of hypodensity is noted in the right frontal lobe anterior to the frontal horn corresponding to the acute infarct on yesterday's MRI. No abnormal intracranial enhancement is identified. No acute intracranial hemorrhage, mass, midline shift, or extra-axial fluid collection is seen. IMPRESSION: 1. No large vessel occlusion. 2. No significant anterior circulation stenosis. 3. Mild left P1 PCA stenosis. 4. Widely patent cervical carotid arteries. 5. Mild asymmetric enlargement of the right palatine tonsil. Correlate with direct visualization. Electronically Signed   By: Logan Bores M.D.   On: 10/16/2015 11:54   Dg Chest 2 View  10/15/2015  CLINICAL DATA:  Intermittent shortness of breath for several years, developed squeezing chest pain yesterday, today with nausea, vomiting, diarrhea, and headache, history hepatitis-C, hypertension EXAM: CHEST  2 VIEW COMPARISON:   05/14/2014 FINDINGS: Enlargement of cardiac silhouette with pulmonary vascular congestion. Tortuosity of thoracic aorta. Emphysematous lungs with central peribronchial thickening. New opacity in mid LEFT lung, 5.3 x 3.0 cm, mass not excluded. Subsegmental atelectasis at both lung bases. No definite acute infiltrate or edema. Tiny bibasilar effusions. Diffuse osseous demineralization without acute bony abnormalities. Orthopedic screws at RIGHT humeral head. IMPRESSION: COPD changes with new opacity in LEFT mid lung 5.3 x 3.0 cm in size; mass not excluded. CT chest with contrast recommended for further evaluation. Enlargement of cardiac silhouette. Bibasilar atelectasis and tiny pleural effusions. Electronically Signed   By: Lavonia Dana M.D.   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 Lower extremity weakness earlier in  the week. Patient is anticoagulated. History of hepatitis and hypertension. EXAM: CT HEAD WITHOUT CONTRAST TECHNIQUE: Contiguous axial images were obtained from the base of the skull through the vertex without intravenous contrast. COMPARISON:  None. FINDINGS: No evidence for acute infarction, hemorrhage, intra-axial mass lesion, hydrocephalus, or extra-axial fluid. Mild cerebral and cerebellar atrophy. Slight hypoattenuation of white matter suggesting small vessel disease. Calvarium intact. Along the lateral margin of the LEFT cavernous sinus, the wall bulges outwardly and there is modest hyperattenuation. It is unclear if this is artifactual asymmetry from partial volume averaging, dolichoectasia or aneurysmal dilatation of the LEFT internal carotid artery, or a cavernous sinus mass such as a schwannoma, meningioma, or even metastasis. No osseous destructive process is evident. In the setting of a patient with history of grand mal seizures, and proximity to the medial LEFT temporal lobe, consider MRI brain without and with  contrast. No sinus or mastoid disease.  Negative orbits. IMPRESSION: Mild atrophy and small vessel disease. No acute stroke or hemorrhage. Asymmetric lateral bulging LEFT cavernous sinus; see discussion above. Consider MRI brain without and with contrast for further evaluation. Electronically Signed   By: Staci Righter M.D.   On: 10/15/2015 17:09   Ct Angio Neck W/cm &/or Wo/cm  10/16/2015  CLINICAL DATA:  Headache, dizziness, and blurry vision. Acute right anterior frontal lobe parasagittal infarct on MRI. EXAM: CT ANGIOGRAPHY HEAD AND NECK TECHNIQUE: Multidetector CT imaging of the head and neck was performed using the standard protocol during bolus administration of intravenous contrast. Multiplanar CT image reconstructions and MIPs were obtained to evaluate the vascular anatomy. Carotid stenosis measurements (when applicable) are obtained utilizing NASCET criteria, using the distal internal carotid diameter as the denominator. CONTRAST:  8mL OMNIPAQUE IOHEXOL 350 MG/ML SOLN COMPARISON:  Brain MRI 10/15/2015 FINDINGS: CTA NECK Aortic arch: 3 vessel aortic arch. Brachiocephalic and subclavian arteries are widely patent. Right carotid system: Patent without evidence of stenosis, dissection, or significant atherosclerosis. Tortuous and partially retropharyngeal proximal ICA. Left carotid system: Patent without evidence of stenosis, dissection, or significant atherosclerosis. Tortuous distal cervical ICA. Vertebral arteries: Patent without stenosis. Right vertebral artery is minimally larger than the left. Skeleton: Mild lower cervical disc degeneration. Other neck: Partially visualized small right pleural effusion. Mild asymmetric enlargement of the right palatine tonsil/anterior tonsillar pillar. CTA HEAD Anterior circulation: The internal carotid arteries are patent from skullbase to carotid termini with mild non stenotic calcified plaque. The left cavernous ICA is slightly ectatic. MCAs are patent without  evidence of significant proximal stenosis or major branch vessel occlusion. The ACAs are patent without evidence of significant stenosis. The left ACA is dominant. No intracranial aneurysm is identified. Posterior circulation: The vertebral arteries are widely patent to the basilar with the right being mildly dominant. PICA and SCA origins are patent. Basilar artery is patent without stenosis. There is a fetal type origin of the right PCA. A left posterior communicating artery is also present. There is irregularity and mild narrowing of the left P1 segment. Mild PCA branch vessel irregularity is noted bilaterally. Venous sinuses: Patent. Anatomic variants: Fetal type origin of the right PCA. Dominant left ACA. Delayed phase: A small focus of hypodensity is noted in the right frontal lobe anterior to the frontal horn corresponding to the acute infarct on yesterday's MRI. No abnormal intracranial enhancement is identified. No acute intracranial hemorrhage, mass, midline shift, or extra-axial fluid collection is seen. IMPRESSION: 1. No large vessel occlusion. 2. No significant anterior circulation stenosis. 3. Mild left P1 PCA  stenosis. 4. Widely patent cervical carotid arteries. 5. Mild asymmetric enlargement of the right palatine tonsil. Correlate with direct visualization. Electronically Signed   By: Logan Bores M.D.   On: 10/16/2015 11:54   Ct Angio Chest Pe W/cm &/or Wo Cm  10/15/2015  CLINICAL DATA:  LEFT side chest pain and shortness of breath, ongoing but increased 1 week ago, history of hypertension, hepatitis-C, anxiety EXAM: CT ANGIOGRAPHY CHEST WITH CONTRAST TECHNIQUE: Multidetector CT imaging of the chest was performed using the standard protocol during bolus administration of intravenous contrast. Multiplanar CT image reconstructions and MIPs were obtained to evaluate the vascular anatomy. CONTRAST:  168mL OMNIPAQUE IOHEXOL 350 MG/ML SOLN IV COMPARISON:  None FINDINGS: Aorta normal caliber without  aneurysm or dissection. Calcified 14 mm diameter gallstone within gallbladder. Remaining visualized upper abdomen unremarkable. Scattered streak artifacts traversing the lower lobes due to inclusion of patient's RIGHT arm within the imaged field. Enlargement of cardiac chambers diffusely. Pulmonary arteries well opacified and patent. Single questionable somewhat linear appearing filling defect is seen adjacent to the wall of a LEFT lower lobe pulmonary artery, series 406, image 168, question artifact or less likely a sequela of a prior pulmonary embolus such as a web or synechiae. This does not have the typical appearance of an acute pulmonary embolus. No other pulmonary arterial perfusion abnormalities are identified. Small BILATERAL pleural effusions. Loculated pleural fluid within the LEFT major fissure, accounting for abnormal chest radiograph finding of questionable mid lung opacity. Peribronchial thickening with question central pulmonary infiltrates in both lower lobes, favor edema over infection. No pneumothorax or acute osseous findings. Review of the MIP images confirms the above findings. IMPRESSION: Single somewhat linear appearing questionable filling defect in a LEFT lower lobe pulmonary artery, favor artifact or sequela of remote pulmonary embolism ; this does not have the typical appearance of an acute pulmonary embolus. Remaining pulmonary arteries are normal in appearance. Small BILATERAL pleural effusions with loculation of fluid in the LEFT major fissure accounting for chest radiographic abnormality. Enlargement of cardiac chambers with peribronchial thickening with central infiltrates particularly in the lower lobes raising question of pulmonary edema. Electronically Signed   By: Lavonia Dana M.D.   On: 10/15/2015 17:31   [name] [id number] Contrast  10/16/2015  CLINICAL DATA:  Initial evaluation for acute headache. EXAM: MRI HEAD WITHOUT AND WITH CONTRAST TECHNIQUE: Multiplanar, multiecho pulse  sequences of the brain and surrounding structures were obtained without and with intravenous contrast. CONTRAST:  42mL MULTIHANCE GADOBENATE DIMEGLUMINE 529 MG/ML IV SOLN COMPARISON:  Prior CT from earlier the same day. FINDINGS: Mild diffuse prominence of CSF containing spaces is compatible with generalized age-related cerebral atrophy. Patchy T2/FLAIR hyperintensity within the periventricular and deep white matter both cerebral hemispheres present, most consistent with chronic small vessel ischemic disease, mild for patient age. Small remote lacunar infarct within the anterior body of the corpus callosum (series 2, image 14). There is a small 14 mm focus of restricted diffusion within the paramedian right frontal lobe, adjacent to the frontal horn of the right lateral ventricle (series 4, image 31). Finding consistent with an acute ischemic infarct. No significant mass effect. No associated hemorrhage. No other abnormal foci of restricted diffusion to suggest acute infarction. Major intracranial vascular flow voids are maintained. No acute or chronic intracranial hemorrhage. No mass lesion, midline shift, or mass effect. No hydrocephalus. No extra-axial fluid collection. No abnormal enhancement on post-contrast sequences. Previously question lateral bowing of the left cavernous sinus appears to be related  to mild lateral bowing of the cavernous segment of the left ICA. Craniocervical junction within normal limits. Pituitary gland normal.  No acute abnormality about the orbits. Paranasal sinuses are clear. No mastoid effusion. Inner ear structures grossly unremarkable. Bone marrow signal intensity within normal limits. No scalp soft tissue abnormality. IMPRESSION: 1. 14 mm acute ischemic infarct involving the anterior parasagittal right frontal lobe. No associated hemorrhage or significant mass effect. 2. No other acute intracranial process. No mass lesion identified. Previously question lateral bowing of the left  cavernous sinus appears to be related to mild lateral bowing of the cavernous left ICA. 3. Mild chronic microvascular ischemic disease for patient age. Electronically Signed   By: Jeannine Boga M.D.   On: 10/16/2015 00:03    CBC  Recent Labs Lab 10/15/15 1420  WBC 5.6  HGB 12.8*  HCT 39.3  PLT 233  MCV 90.8  MCH 29.6  MCHC 32.6  RDW 15.1    Chemistries   Recent Labs Lab 10/15/15 1420 10/15/15 2114 10/16/15 0235  NA 143  --  144  K 4.4  --  4.2  CL 111  --  107  CO2 21*  --  28  GLUCOSE 100*  --  105*  BUN 10  --  9  CREATININE 0.97  --  1.04  CALCIUM 8.9  --  8.9  MG  --   --  1.8  AST  --  31  --   ALT  --  21  --   ALKPHOS  --  87  --   BILITOT  --  1.3*  --    ------------------------------------------------------------------------------------------------------------------ estimated creatinine clearance is 53.4 mL/min (by C-G formula based on Cr of 1.04). ------------------------------------------------------------------------------------------------------------------ No results for input(s): HGBA1C in the last 72 hours. ------------------------------------------------------------------------------------------------------------------  Recent Labs  10/16/15 0235  CHOL 159  HDL 49  LDLCALC 102*  TRIG 41  CHOLHDL 3.2   ------------------------------------------------------------------------------------------------------------------ No results for input(s): TSH, T4TOTAL, T3FREE, THYROIDAB in the last 72 hours.  Invalid input(s): FREET3 ------------------------------------------------------------------------------------------------------------------ No results for input(s): VITAMINB12, FOLATE, FERRITIN, TIBC, IRON, RETICCTPCT in the last 72 hours.  Coagulation profile  Recent Labs Lab 10/15/15 1420  INR 1.13    No results for input(s): DDIMER in the last 72 hours.  Cardiac Enzymes  Recent Labs Lab 10/15/15 2114 10/16/15 0235  10/16/15 0905  TROPONINI 0.03 0.03 0.04*   ------------------------------------------------------------------------------------------------------------------ Invalid input(s): POCBNP    Biff Rutigliano D.O. on 10/16/2015 at 1:50 PM  Between 7am to 7pm - Pager - [phone]  After 7pm go to [email or web address] - password [email or web address]  And look for the night coverage person covering for me after hours  [facility] Office  [phone]

## 2015-10-16 NOTE — Progress Notes (Signed)
Utilization review completed. Jenavi Beedle, RN, BSN. 

## 2015-10-16 NOTE — Evaluation (Signed)
Speech Language Pathology Evaluation Patient Details Name: Alexis [name] MRN: [id number] DOB: [birth year] Today's Date: 10/16/2015 Time: [id number] SLP Time Calculation (min) (ACUTE ONLY): 26 min  Problem List:  Patient Active Problem List   Diagnosis Date Noted  . Acute respiratory failure with hypoxia ([place]) 10/15/2015  . Acute CHF (congestive heart failure) ([place]) 10/15/2015  . Nausea vomiting and diarrhea 10/15/2015  . Shortness of breath 10/15/2015  . Headache 10/15/2015  . Loss of weight 10/15/2015  . Protein-calorie malnutrition ([place]) 10/15/2015  . Hepatitis C   . Hypertension   . Chronic hepatitis C without hepatic coma ([place])   . Chronic cluster headache, not intractable    Past Medical History:  Past Medical History  Diagnosis Date  . Hepatitis C   . Hypertension   . Headache   . TIA (transient ischemic attack)    Past Surgical History:  Past Surgical History  Procedure Laterality Date  . Fracture surgery    . Rotator cuff repair     HPI:  65 y.o. male with h/o COPD, HTN, hepatitis C, cluster headache, who presented to ED with SOB, chest pain, cough, headache, nausea, vomiting, diarrhea and dysuria. 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 No associated hemorrhage or significant mass effect.   Assessment / Plan / Recommendation Clinical Impression  SLP evaluated pt with Cognistat standardized assessment to determine cognitive and linguistic skills s/p stroke. Pt reported memory deficits at baseline, however compensates by writing appts down as well as pertinent conversations. Pt reported mild dysfluencies at baseline but SLP deemed dysfluencies within normal range for age per pt report and no difficulty during assessment. No other deficits noted. Pt educated re: results of Cognistat and compensatory strategies for memory. No further SLP intervention is warranted at this time.    SLP Assessment  Patient does not need  any further Speech Lanaguage Pathology Services    Follow Up Recommendations  None          SLP Evaluation Prior Functioning  Cognitive/Linguistic Baseline: Baseline deficits Baseline deficit details: Pt reported "forgetfulness" at baseline, writes items down to remember Type of Home: Apartment  Lives With: Alone Education: 1 year of college Vocation: Retired (worked for [employer])   Cognition  Overall Cognitive Status: History of cognitive impairments - at baseline (pt reported memory deficits) Arousal/Alertness: Awake/alert Orientation Level: Oriented X4 Attention: Sustained Sustained Attention: Appears intact Memory: Appears intact Awareness: Appears intact Problem Solving: Appears intact Executive Function: Reasoning;Self Monitoring Reasoning: Appears intact Self Monitoring: Appears intact Safety/Judgment: Appears intact    Comprehension  Auditory Comprehension Overall Auditory Comprehension: Appears within functional limits for tasks assessed Yes/No Questions: Within Functional Limits Commands: Within Functional Limits Conversation: Simple Visual Recognition/Discrimination Discrimination: Not tested Reading Comprehension Reading Status: Not tested    Expression Expression Primary Mode of Expression: Verbal Verbal Expression Overall Verbal Expression: Appears within functional limits for tasks assessed Initiation: No impairment Level of Generative/Spontaneous Verbalization: Conversation Repetition: No impairment Naming: No impairment Pragmatics: No impairment Non-Verbal Means of Communication: Not applicable Written Expression Dominant Hand: Right Written Expression: Not tested   Oral / Motor  Oral Motor/Sensory Function Overall Oral Motor/Sensory Function: Within functional limits Motor Speech Overall Motor Speech: Appears within functional limits for tasks assessed Respiration: Within functional limits Phonation: Normal Resonance: Within functional  limits Articulation: Within functional limitis Intelligibility: Intelligible Motor Planning: Witnin functional limits Motor Speech Errors: Not applicable   GO  Titus Mould 10/16/2015, 11:32 AM   Titus Mould, Student-SLP

## 2015-10-16 NOTE — Progress Notes (Signed)
Patient educated on safety precautions and reasons for bed alarm due to patient having a fall in the past 6 months. Patient states he does not want the bed alarm. Made patient aware that we will continue to do hourly rounding to monitor patient and for safety reasons and continue to call nurse or nurse tech when in need of assistance. Patient understood. Will continue to monitor patient to end of shift.

## 2015-10-16 NOTE — Progress Notes (Signed)
  Echocardiogram 2D Echocardiogram has been performed.  Alexis [name] 10/16/2015, 3:31 PM

## 2015-10-16 NOTE — Consult Note (Signed)
Requesting Physician: Dr.  Blaine Hamper    Reason for consultation: To evaluate acute stroke seen on brain MRI  HPI:                                                                                                                                         Alexis [name] is an 65 y.o. male patient who presented to the emergency room due to restricted distress, chest pain symptoms. He had a brain MRI study done in the ER which showed an incidental small acute infarct in the right frontal lobe. Neurology service is consulted for further evaluation. Patient denies any focal vision speech problems or sensorimotor abnormality set this time. But about 5 days ago, apparently he had bilateral leg weakness and trouble walking which she continued to have for the next few days. The leg weakness has gradually improved over time. He denies weakness in one leg more than the other and he felt that his both legs were weak. He also has chronic headaches and takes Fioricet. History of hep C, asymptomatic now.   Date last known well:  10/11/2015 Time last known well:  Unknown tPA Given: No: Outside time window  Stroke Risk Factors - hypertension  Past Medical History: Past Medical History  Diagnosis Date  . Hepatitis C   . Hypertension     Past Surgical History  Procedure Laterality Date  . Fracture surgery      Family History: Family History  Problem Relation Age of Onset  . Breast cancer Mother   . Aneurysm Mother     Brain aneurysm  . Hypertension Father   . Cancer Father     Pancreatic cancer  . Hypertension Brother     Social History:   reports that he has never smoked. He does not have any smokeless tobacco history on file. He reports that he drinks alcohol. He reports that he uses illicit drugs (Marijuana).  Allergies:  Allergies  Allergen Reactions  . Lisinopril     Decrease sexual function     Medications:                                                                                                                          Current facility-administered medications:  .  0.9 %  sodium chloride infusion, 250 mL, Intravenous, PRN, Ivor Costa, MD .  albuterol (PROVENTIL) (2.5 MG/3ML)  0.083% nebulizer solution 2.5 mg, 2.5 mg, Nebulization, Q4H PRN, Ivor Costa, MD .  Margrett Rud azithromycin [facility]) tablet 500 mg, 500 mg, Oral, Daily, 500 mg at 10/15/15 2306 **FOLLOWED BY** azithromycin (ZITHROMAX) tablet 250 mg, 250 mg, Oral, Daily, Ivor Costa, MD .  butalbital-acetaminophen-caffeine (FIORICET, ESGIC) 50-325-40 MG per tablet 1 tablet, 1 tablet, Oral, BID PRN, Ivor Costa, MD .  clopidogrel (PLAVIX) tablet 75 mg, 75 mg, Oral, Daily, Corleone Biegler Fuller Mandril, MD .  dextromethorphan-guaiFENesin [facility] DM) 30-600 MG per 12 hr tablet 1 tablet, 1 tablet, Oral, BID, Ivor Costa, MD, 1 tablet at 10/15/15 2307 .  enoxaparin (LOVENOX) injection 40 mg, 40 mg, Subcutaneous, Q24H, Ivor Costa, MD, 40 mg at 10/15/15 2308 .  feeding supplement (ENSURE ENLIVE) (ENSURE ENLIVE) liquid 237 mL, 237 mL, Oral, BID BM, Ivor Costa, MD .  furosemide (LASIX) injection 20 mg, 20 mg, Intravenous, Daily, Ivor Costa, MD .  hydrALAZINE (APRESOLINE) injection 5 mg, 5 mg, Intravenous, Q2H PRN, Ivor Costa, MD .  hydrOXYzine (VISTARIL) injection 25 mg, 25 mg, Intramuscular, Q6H PRN, Ivor Costa, MD .  morphine 2 MG/ML injection 2 mg, 2 mg, Intravenous, Q4H PRN, Ivor Costa, MD, 2 mg at 10/15/15 2123 .  sodium chloride flush (NS) 0.9 % injection 3 mL, 3 mL, Intravenous, Q12H, Ivor Costa, MD, 3 mL at 10/15/15 2313 .  sodium chloride flush (NS) 0.9 % injection 3 mL, 3 mL, Intravenous, PRN, Ivor Costa, MD .  zolpidem (AMBIEN) tablet 5 mg, 5 mg, Oral, Once, Jeryl Columbia, NP   ROS:                                                                                                                                       History obtained from the patient  General ROS: negative for - chills, fatigue, fever, night sweats, weight gain or  weight loss Psychological ROS: negative for - behavioral disorder, hallucinations, memory difficulties, mood swings or suicidal ideation Ophthalmic ROS: negative for - blurry vision, double vision, eye pain or loss of vision ENT ROS: negative for - epistaxis, nasal discharge, oral lesions, sore throat, tinnitus or vertigo Allergy and Immunology ROS: negative for - hives or itchy/watery eyes Hematological and Lymphatic ROS: negative for - bleeding problems, bruising or swollen lymph nodes Endocrine ROS: negative for - galactorrhea, hair pattern changes, polydipsia/polyuria or temperature intolerance Respiratory ROS: Positive for - cough,  shortness of breath or wheezing Cardiovascular ROS: Positive for - chest pain, dyspnea on exertion,  Gastrointestinal ROS: negative for - abdominal pain, diarrhea, hematemesis, nausea/vomiting or stool incontinence Genito-Urinary ROS: negative for - dysuria, hematuria, incontinence or urinary frequency/urgency Musculoskeletal ROS: negative for - joint swelling or muscular weakness Neurological ROS: as noted in HPI Dermatological ROS: negative for rash and skin lesion changes  Neurologic Examination:  Blood pressure 138/49, pulse 72, temperature 98.4 F (36.9 C), temperature source Oral, resp. rate 20, height 5\' 4"  (1.626 m), weight 53.343 kg (117 lb 9.6 oz), SpO2 96 %.  Evaluation of higher integrative functions including: Level of alertness: Alert,  Oriented to time, place and person Attention span and concentration  - intact   Speech: fluent, no evidence of dysarthria or aphasia noted.  Test the following cranial nerves: 2-12 grossly intact Motor examination: Normal tone, bulk, full 5/5 motor strength in all 4 extremities Examination of sensation : Normal and symmetric sensation to pinprick in all 4 extremities and on face Examination of deep tendon  reflexes: 1+, and biceps, triceps, trace brachial radialis and knees, absent in ankles, normal plantars bilaterally Test coordination: Normal finger nose testing, with no evidence of limb appendicular ataxia or abnormal involuntary movements or tremors noted.  Gait: Deferred   Lab Results: Basic Metabolic Panel:  Recent Labs Lab 10/15/15 1420 10/16/15 0235  NA 143 144  K 4.4 4.2  CL 111 107  CO2 21* 28  GLUCOSE 100* 105*  BUN 10 9  CREATININE 0.97 1.04  CALCIUM 8.9 8.9  MG  --  1.8    Liver Function Tests:  Recent Labs Lab 10/15/15 2114  AST 31  ALT 21  ALKPHOS 87  BILITOT 1.3*  PROT 7.4  ALBUMIN 3.7   No results for input(s): LIPASE, AMYLASE in the last 168 hours.  Recent Labs Lab 10/15/15 2114  AMMONIA 28    CBC:  Recent Labs Lab 10/15/15 1420  WBC 5.6  HGB 12.8*  HCT 39.3  MCV 90.8  PLT 233    Cardiac Enzymes:  Recent Labs Lab 10/15/15 2114 10/16/15 0235  TROPONINI 0.03 0.03    Lipid Panel:  Recent Labs Lab 10/16/15 0235  CHOL 159  TRIG 41  HDL 49  CHOLHDL 3.2  VLDL 8  LDLCALC 102*    CBG: No results for input(s): GLUCAP in the last 168 hours.  Microbiology: Results for orders placed or performed during the hospital encounter of 03/20/11  Rapid strep screen     Status: None   Collection Time: 03/20/11  4:56 PM  Result Value Ref Range Status   Streptococcus, Group A Screen (Direct) NEGATIVE NEGATIVE Final     Imaging: Dg Chest 2 View  10/15/2015  CLINICAL DATA:  Intermittent shortness of breath for several years, developed squeezing chest pain yesterday, today with nausea, vomiting, diarrhea, and headache, history hepatitis-C, hypertension EXAM: CHEST  2 VIEW COMPARISON:  05/14/2014 FINDINGS: Enlargement of cardiac silhouette with pulmonary vascular congestion. Tortuosity of thoracic aorta. Emphysematous lungs with central peribronchial thickening. New opacity in mid LEFT lung, 5.3 x 3.0 cm, mass not excluded. Subsegmental  atelectasis at both lung bases. No definite acute infiltrate or edema. Tiny bibasilar effusions. Diffuse osseous demineralization without acute bony abnormalities. Orthopedic screws at RIGHT humeral head. IMPRESSION: COPD changes with new opacity in LEFT mid lung 5.3 x 3.0 cm in size; mass not excluded. CT chest with contrast recommended for further evaluation. Enlargement of cardiac silhouette. Bibasilar atelectasis and tiny pleural effusions. Electronically Signed   By: Lavonia Dana M.D.   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 Lower extremity weakness earlier in the week. Patient is anticoagulated. History of hepatitis and hypertension. EXAM: CT HEAD WITHOUT CONTRAST TECHNIQUE: Contiguous axial images were obtained from the base of the skull through the vertex without intravenous  contrast. COMPARISON:  None. FINDINGS: No evidence for acute infarction, hemorrhage, intra-axial mass lesion, hydrocephalus, or extra-axial fluid. Mild cerebral and cerebellar atrophy. Slight hypoattenuation of white matter suggesting small vessel disease. Calvarium intact. Along the lateral margin of the LEFT cavernous sinus, the wall bulges outwardly and there is modest hyperattenuation. It is unclear if this is artifactual asymmetry from partial volume averaging, dolichoectasia or aneurysmal dilatation of the LEFT internal carotid artery, or a cavernous sinus mass such as a schwannoma, meningioma, or even metastasis. No osseous destructive process is evident. In the setting of a patient with history of grand mal seizures, and proximity to the medial LEFT temporal lobe, consider MRI brain without and with contrast. No sinus or mastoid disease.  Negative orbits. IMPRESSION: Mild atrophy and small vessel disease. No acute stroke or hemorrhage. Asymmetric lateral bulging LEFT cavernous sinus; see discussion above. Consider MRI brain without and with contrast for  further evaluation. Electronically Signed   By: Staci Righter M.D.   On: 10/15/2015 17:09   Ct Angio Chest Pe W/cm &/or Wo Cm  10/15/2015  CLINICAL DATA:  LEFT side chest pain and shortness of breath, ongoing but increased 1 week ago, history of hypertension, hepatitis-C, anxiety EXAM: CT ANGIOGRAPHY CHEST WITH CONTRAST TECHNIQUE: Multidetector CT imaging of the chest was performed using the standard protocol during bolus administration of intravenous contrast. Multiplanar CT image reconstructions and MIPs were obtained to evaluate the vascular anatomy. CONTRAST:  163mL OMNIPAQUE IOHEXOL 350 MG/ML SOLN IV COMPARISON:  None FINDINGS: Aorta normal caliber without aneurysm or dissection. Calcified 14 mm diameter gallstone within gallbladder. Remaining visualized upper abdomen unremarkable. Scattered streak artifacts traversing the lower lobes due to inclusion of patient's RIGHT arm within the imaged field. Enlargement of cardiac chambers diffusely. Pulmonary arteries well opacified and patent. Single questionable somewhat linear appearing filling defect is seen adjacent to the wall of a LEFT lower lobe pulmonary artery, series 406, image 168, question artifact or less likely a sequela of a prior pulmonary embolus such as a web or synechiae. This does not have the typical appearance of an acute pulmonary embolus. No other pulmonary arterial perfusion abnormalities are identified. Small BILATERAL pleural effusions. Loculated pleural fluid within the LEFT major fissure, accounting for abnormal chest radiograph finding of questionable mid lung opacity. Peribronchial thickening with question central pulmonary infiltrates in both lower lobes, favor edema over infection. No pneumothorax or acute osseous findings. Review of the MIP images confirms the above findings. IMPRESSION: Single somewhat linear appearing questionable filling defect in a LEFT lower lobe pulmonary artery, favor artifact or sequela of remote pulmonary  embolism ; this does not have the typical appearance of an acute pulmonary embolus. Remaining pulmonary arteries are normal in appearance. Small BILATERAL pleural effusions with loculation of fluid in the LEFT major fissure accounting for chest radiographic abnormality. Enlargement of cardiac chambers with peribronchial thickening with central infiltrates particularly in the lower lobes raising question of pulmonary edema. Electronically Signed   By: Lavonia Dana M.D.   On: 10/15/2015 17:31   [name] [id number] Contrast  10/16/2015  CLINICAL DATA:  Initial evaluation for acute headache. EXAM: MRI HEAD WITHOUT AND WITH CONTRAST TECHNIQUE: Multiplanar, multiecho pulse sequences of the brain and surrounding structures were obtained without and with intravenous contrast. CONTRAST:  42mL MULTIHANCE GADOBENATE DIMEGLUMINE 529 MG/ML IV SOLN COMPARISON:  Prior CT from earlier the same day. FINDINGS: Mild diffuse prominence of CSF containing spaces is compatible with generalized age-related cerebral atrophy. Patchy T2/FLAIR hyperintensity within  the periventricular and deep white matter both cerebral hemispheres present, most consistent with chronic small vessel ischemic disease, mild for patient age. Small remote lacunar infarct within the anterior body of the corpus callosum (series 2, image 14). There is a small 14 mm focus of restricted diffusion within the paramedian right frontal lobe, adjacent to the frontal horn of the right lateral ventricle (series 4, image 31). Finding consistent with an acute ischemic infarct. No significant mass effect. No associated hemorrhage. No other abnormal foci of restricted diffusion to suggest acute infarction. Major intracranial vascular flow voids are maintained. No acute or chronic intracranial hemorrhage. No mass lesion, midline shift, or mass effect. No hydrocephalus. No extra-axial fluid collection. No abnormal enhancement on post-contrast sequences. Previously question lateral bowing  of the left cavernous sinus appears to be related to mild lateral bowing of the cavernous segment of the left ICA. Craniocervical junction within normal limits. Pituitary gland normal.  No acute abnormality about the orbits. Paranasal sinuses are clear. No mastoid effusion. Inner ear structures grossly unremarkable. Bone marrow signal intensity within normal limits. No scalp soft tissue abnormality. IMPRESSION: 1. 14 mm acute ischemic infarct involving the anterior parasagittal right frontal lobe. No associated hemorrhage or significant mass effect. 2. No other acute intracranial process. No mass lesion identified. Previously question lateral bowing of the left cavernous sinus appears to be related to mild lateral bowing of the cavernous left ICA. 3. Mild chronic microvascular ischemic disease for patient age. Electronically Signed   By: Jeannine Boga M.D.   On: 10/16/2015 00:03    Assessment and plan:   Alexis [name] is an 65 y.o. male patient who presented with headache, chest pain and respiratory distress last night to the ER. Has a history of bilateral leg weakness about 5 days ago which apparently has been improving gradually. Had MRI of the brain done in the ER, which showed a small cardioembolic appearing infarct in the right frontal lobe, in the right ACA vascular territory. Patient denies any focal weakness in one leg more than the other per history. He felt that the weakness involved both his lower extremities which has improved now. Urine drug screen positive for cannabinoids, opiates and barbiturates. He does take Fioricet for headaches. Neurological examination is nonfocal at this time. No focal motor weakness noted He'll be admitted for further stroke workup. Recommend CT angiogram of the head and neck to rule out significant carotid and cerebrovascular stenosis, Echocardiogram with bubble study to rule out cardiac thrombus and atrial shunting, will be placed on cardiac telemetry to rule  out paroxysmal atrial fibrillation. He was taking aspirin 81 mg daily at home. Recommend switching to Plavix 75 mg daily. We'll check A1c, lipid profile. Ordered physical therapy for gait and balance evaluation.  Neurology stroke team will continue to follow during the hospitalization. Please call for any further questions.

## 2015-10-16 NOTE — ED Provider Notes (Signed)
CSN: [id number]     Arrival date & time 10/15/15  1356 History   First MD Initiated Contact with Patient 10/15/15 1456     Chief Complaint  Patient presents with  . Shortness of Breath  . Chest Pain     (Consider location/radiation/quality/duration/timing/severity/associated sxs/prior Treatment) Patient is a 65 y.o. male presenting with shortness of breath and chest pain.  Shortness of Breath Severity:  Severe Onset quality:  Gradual Duration: months, however worsening in last weeks. Timing:  Constant Progression:  Waxing and waning Chronicity:  New Context: activity   Context comment:  Laying down Worsened by:  Exertion (laying down) Associated symptoms: chest pain, cough (chronic), diaphoresis (night sweats) and headaches (hx of headaches, however worsening left headache)   Associated symptoms: no abdominal pain, no fever, no hemoptysis, no neck pain, no rash, no sore throat, no syncope, no vomiting and no wheezing   Risk factors: no hx of PE/DVT, no prolonged immobilization and no recent surgery   Chest Pain Associated symptoms: cough (chronic), diaphoresis (night sweats), fatigue, headache (hx of headaches, however worsening left headache) and shortness of breath   Associated symptoms: no abdominal pain, no back pain, no fever, no nausea, no numbness, no syncope, not vomiting and no weakness (episode days ago when had difficulty walking through grocery store and had to be carried out, now improved)     Past Medical History  Diagnosis Date  . Hepatitis C   . Hypertension    Past Surgical History  Procedure Laterality Date  . Fracture surgery     Family History  Problem Relation Age of Onset  . Breast cancer Mother   . Aneurysm Mother     Brain aneurysm  . Hypertension Father   . Cancer Father     Pancreatic cancer  . Hypertension Brother    Social History  Substance Use Topics  . Smoking status: Never Smoker   . Smokeless tobacco: None  . Alcohol Use: Yes     Review of Systems  Constitutional: Positive for diaphoresis (night sweats), fatigue and unexpected weight change. Negative for fever.  HENT: Negative for sore throat.   Eyes: Negative for visual disturbance.  Respiratory: Positive for cough (chronic) and shortness of breath. Negative for hemoptysis and wheezing.   Cardiovascular: Positive for chest pain. Negative for syncope.  Gastrointestinal: Negative for nausea, vomiting, abdominal pain and diarrhea.  Genitourinary: Negative for difficulty urinating.  Musculoskeletal: Negative for back pain, neck pain and neck stiffness.  Skin: Negative for rash.  Neurological: Positive for headaches (hx of headaches, however worsening left headache). Negative for syncope, weakness (episode days ago when had difficulty walking through grocery store and had to be carried out, now improved) and numbness.      Allergies  Lisinopril  Home Medications   Prior to Admission medications   Medication Sig Start Date End Date Taking? Authorizing Provider  albuterol (PROVENTIL HFA;VENTOLIN HFA) 108 (90 Base) MCG/ACT inhaler Inhale 1 puff into the lungs every 6 (six) hours as needed for wheezing or shortness of breath.   Yes Historical Provider, MD  aspirin EC 81 MG tablet Take 81 mg by mouth daily.   Yes Historical Provider, MD  butalbital-acetaminophen-caffeine (FIORICET, ESGIC) 50-325-40 MG tablet Take 1 tablet by mouth 2 (two) times daily as needed for headache.   Yes Historical Provider, MD  lisinopril (PRINIVIL,ZESTRIL) 40 MG tablet Take 40 mg by mouth daily.   Yes Historical Provider, MD   BP 161/60 mmHg  Pulse 76  Temp(Src)  97.7 F (36.5 C) (Oral)  Resp 20  Ht 5\' 4"  (1.626 m)  Wt 119 lb 4.8 oz (54.114 kg)  BMI 20.47 kg/m2  SpO2 94% Physical Exam  Constitutional: He is oriented to person, place, and time. He appears well-developed and well-nourished. No distress.  HENT:  Head: Normocephalic and atraumatic.  Eyes: Conjunctivae and EOM are  normal.  Neck: Normal range of motion.  Cardiovascular: Normal rate, regular rhythm, normal heart sounds and intact distal pulses.  Exam reveals no gallop and no friction rub.   No murmur heard. Pulmonary/Chest: Effort normal and breath sounds normal. No respiratory distress. He has no wheezes. He has no rales.  Abdominal: Soft. He exhibits no distension. There is no tenderness. There is no guarding.  Musculoskeletal: He exhibits no edema.  Neurological: He is alert and oriented to person, place, and time. He has normal strength. No cranial nerve deficit or sensory deficit. Coordination normal. GCS eye subscore is 4. GCS verbal subscore is 5. GCS motor subscore is 6.  Skin: Skin is warm and dry. He is not diaphoretic.  Nursing note and vitals reviewed.   ED Course  Procedures (including critical care time) Labs Review Labs Reviewed  BASIC METABOLIC PANEL - Abnormal; Notable for the following:    CO2 21 (*)    Glucose, Bld 100 (*)    All other components within normal limits  CBC - Abnormal; Notable for the following:    Hemoglobin 12.8 (*)    All other components within normal limits  BRAIN NATRIURETIC PEPTIDE - Abnormal; Notable for the following:    B Natriuretic Peptide 2457.0 (*)    All other components within normal limits  URINE RAPID DRUG SCREEN, HOSP PERFORMED - Abnormal; Notable for the following:    Opiates POSITIVE (*)    Tetrahydrocannabinol POSITIVE (*)    Barbiturates POSITIVE (*)    All other components within normal limits  HEPATIC FUNCTION PANEL - Abnormal; Notable for the following:    Total Bilirubin 1.3 (*)    All other components within normal limits  BASIC METABOLIC PANEL - Abnormal; Notable for the following:    Glucose, Bld 105 (*)    All other components within normal limits  LIPID PANEL - Abnormal; Notable for the following:    LDL Cholesterol 102 (*)    All other components within normal limits  CULTURE, EXPECTORATED SPUTUM-ASSESSMENT  C DIFFICILE  QUICK SCREEN W PCR REFLEX  GASTROINTESTINAL PANEL BY PCR, STOOL (REPLACES STOOL CULTURE)  PROTIME-INR  APTT  TROPONIN I  TROPONIN I  AMMONIA  URINALYSIS, ROUTINE W REFLEX MICROSCOPIC (NOT AT [facility])  MAGNESIUM  HIV ANTIBODY (ROUTINE TESTING)  HEMOGLOBIN A1C  TROPONIN I  I-STAT TROPOININ, ED    Imaging Review Dg Chest 2 View  10/15/2015  CLINICAL DATA:  Intermittent shortness of breath for several years, developed squeezing chest pain yesterday, today with nausea, vomiting, diarrhea, and headache, history hepatitis-C, hypertension EXAM: CHEST  2 VIEW COMPARISON:  05/14/2014 FINDINGS: Enlargement of cardiac silhouette with pulmonary vascular congestion. Tortuosity of thoracic aorta. Emphysematous lungs with central peribronchial thickening. New opacity in mid LEFT lung, 5.3 x 3.0 cm, mass not excluded. Subsegmental atelectasis at both lung bases. No definite acute infiltrate or edema. Tiny bibasilar effusions. Diffuse osseous demineralization without acute bony abnormalities. Orthopedic screws at RIGHT humeral head. IMPRESSION: COPD changes with new opacity in LEFT mid lung 5.3 x 3.0 cm in size; mass not excluded. CT chest with contrast recommended for further evaluation. Enlargement of cardiac  silhouette. Bibasilar atelectasis and tiny pleural effusions. Electronically Signed   By: Lavonia Dana M.D.   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 Lower extremity weakness earlier in the week. Patient is anticoagulated. History of hepatitis and hypertension. EXAM: CT HEAD WITHOUT CONTRAST TECHNIQUE: Contiguous axial images were obtained from the base of the skull through the vertex without intravenous contrast. COMPARISON:  None. FINDINGS: No evidence for acute infarction, hemorrhage, intra-axial mass lesion, hydrocephalus, or extra-axial fluid. Mild cerebral and cerebellar atrophy. Slight hypoattenuation of white matter suggesting  small vessel disease. Calvarium intact. Along the lateral margin of the LEFT cavernous sinus, the wall bulges outwardly and there is modest hyperattenuation. It is unclear if this is artifactual asymmetry from partial volume averaging, dolichoectasia or aneurysmal dilatation of the LEFT internal carotid artery, or a cavernous sinus mass such as a schwannoma, meningioma, or even metastasis. No osseous destructive process is evident. In the setting of a patient with history of grand mal seizures, and proximity to the medial LEFT temporal lobe, consider MRI brain without and with contrast. No sinus or mastoid disease.  Negative orbits. IMPRESSION: Mild atrophy and small vessel disease. No acute stroke or hemorrhage. Asymmetric lateral bulging LEFT cavernous sinus; see discussion above. Consider MRI brain without and with contrast for further evaluation. Electronically Signed   By: Staci Righter M.D.   On: 10/15/2015 17:09   Ct Angio Chest Pe W/cm &/or Wo Cm  10/15/2015  CLINICAL DATA:  LEFT side chest pain and shortness of breath, ongoing but increased 1 week ago, history of hypertension, hepatitis-C, anxiety EXAM: CT ANGIOGRAPHY CHEST WITH CONTRAST TECHNIQUE: Multidetector CT imaging of the chest was performed using the standard protocol during bolus administration of intravenous contrast. Multiplanar CT image reconstructions and MIPs were obtained to evaluate the vascular anatomy. CONTRAST:  170mL OMNIPAQUE IOHEXOL 350 MG/ML SOLN IV COMPARISON:  None FINDINGS: Aorta normal caliber without aneurysm or dissection. Calcified 14 mm diameter gallstone within gallbladder. Remaining visualized upper abdomen unremarkable. Scattered streak artifacts traversing the lower lobes due to inclusion of patient's RIGHT arm within the imaged field. Enlargement of cardiac chambers diffusely. Pulmonary arteries well opacified and patent. Single questionable somewhat linear appearing filling defect is seen adjacent to the wall of a  LEFT lower lobe pulmonary artery, series 406, image 168, question artifact or less likely a sequela of a prior pulmonary embolus such as a web or synechiae. This does not have the typical appearance of an acute pulmonary embolus. No other pulmonary arterial perfusion abnormalities are identified. Small BILATERAL pleural effusions. Loculated pleural fluid within the LEFT major fissure, accounting for abnormal chest radiograph finding of questionable mid lung opacity. Peribronchial thickening with question central pulmonary infiltrates in both lower lobes, favor edema over infection. No pneumothorax or acute osseous findings. Review of the MIP images confirms the above findings. IMPRESSION: Single somewhat linear appearing questionable filling defect in a LEFT lower lobe pulmonary artery, favor artifact or sequela of remote pulmonary embolism ; this does not have the typical appearance of an acute pulmonary embolus. Remaining pulmonary arteries are normal in appearance. Small BILATERAL pleural effusions with loculation of fluid in the LEFT major fissure accounting for chest radiographic abnormality. Enlargement of cardiac chambers with peribronchial thickening with central infiltrates particularly in the lower lobes raising question of pulmonary edema. Electronically Signed   By: Lavonia Dana M.D.   On: 10/15/2015 17:31   [name]  Wo Contrast  10/16/2015  CLINICAL DATA:  Initial evaluation for acute headache. EXAM: MRI HEAD WITHOUT AND WITH CONTRAST TECHNIQUE: Multiplanar, multiecho pulse sequences of the brain and surrounding structures were obtained without and with intravenous contrast. CONTRAST:  50mL MULTIHANCE GADOBENATE DIMEGLUMINE 529 MG/ML IV SOLN COMPARISON:  Prior CT from earlier the same day. FINDINGS: Mild diffuse prominence of CSF containing spaces is compatible with generalized age-related cerebral atrophy. Patchy T2/FLAIR hyperintensity within the periventricular and deep white matter both cerebral  hemispheres present, most consistent with chronic small vessel ischemic disease, mild for patient age. Small remote lacunar infarct within the anterior body of the corpus callosum (series 2, image 14). There is a small 14 mm focus of restricted diffusion within the paramedian right frontal lobe, adjacent to the frontal horn of the right lateral ventricle (series 4, image 31). Finding consistent with an acute ischemic infarct. No significant mass effect. No associated hemorrhage. No other abnormal foci of restricted diffusion to suggest acute infarction. Major intracranial vascular flow voids are maintained. No acute or chronic intracranial hemorrhage. No mass lesion, midline shift, or mass effect. No hydrocephalus. No extra-axial fluid collection. No abnormal enhancement on post-contrast sequences. Previously question lateral bowing of the left cavernous sinus appears to be related to mild lateral bowing of the cavernous segment of the left ICA. Craniocervical junction within normal limits. Pituitary gland normal.  No acute abnormality about the orbits. Paranasal sinuses are clear. No mastoid effusion. Inner ear structures grossly unremarkable. Bone marrow signal intensity within normal limits. No scalp soft tissue abnormality. IMPRESSION: 1. 14 mm acute ischemic infarct involving the anterior parasagittal right frontal lobe. No associated hemorrhage or significant mass effect. 2. No other acute intracranial process. No mass lesion identified. Previously question lateral bowing of the left cavernous sinus appears to be related to mild lateral bowing of the cavernous left ICA. 3. Mild chronic microvascular ischemic disease for patient age. Electronically Signed   By: Jeannine Boga M.D.   On: 10/16/2015 00:03   I have personally reviewed and evaluated these images and lab results as part of my medical decision-making.   EKG Interpretation   Date/Time:  Wednesday October 15 2015 14:02:13 EST Ventricular  Rate:  77 PR Interval:  166 QRS Duration: 104 QT Interval:  443 QTC Calculation: 501 R Axis:   74 Text Interpretation:  Sinus rhythm Left atrial enlargement LVH with  secondary repolarization abnormality Prolonged QT interval Confirmed by  Winfred Leeds  MD, SAM [phone]) on 10/15/2015 2:16:32 PM      MDM   Final diagnoses:  Headache  Acute respiratory failure with hypoxia (HCC)  Acute congestive heart failure, unspecified congestive heart failure type [facility])    65yo male with a history of hypertension, report of rhythm problem with a heart, hepatitis C, cluster headaches presents with concern for chest pain and shortness of breath, with months of night sweats and weight loss and worsening headaches and hypoxia on arrival to ED. Chest x-ray is concerning for possible lung mass, and CT PE study was completed to evaluate for possible PE or mass, and showed loculated pleural effusion which was abnormal finding on XR, and question of remote PE as well as signs of pulmonary edema.  Patient's BNP is elevated to the 2006, and patient has JVD on exam and feel more likely etiology of his hypoxia is acute CHF. Patient was given 20 mg of Lasix.  Regarding headaches, CT head was completed which showed an abnormality, possibly from aneurysm or mass, which  should be further evaluated with MRI.  Delta troponins both negative and overall hx not consistent with ACS.   Given history, physical exam and laboratory values are most consistent with acute CHF exacerbation, and there is no acute pulmonary embolus on CT, will not initiate anticoagulation at this time, and will defer decision to inpt team pending clinical course with diuresis and evaluation of effusion/MRI.  Pt admitted for further care.   Gareth Morgan, MD 10/16/15 [phone]

## 2015-10-16 NOTE — Progress Notes (Signed)
STROKE TEAM PROGRESS NOTE   HISTORY OF PRESENT ILLNESS Alexis [name] is an 65 y.o. male patient who presented to the emergency room due to respiratory distress, chest pain symptoms. He had a brain MRI study done in the ER which showed an incidental small acute infarct in the right frontal lobe. Neurology service is consulted for further evaluation. Patient denies any focal vision speech problems or sensorimotor abnormality set this time. But about 5 days ago, apparently he had bilateral leg weakness and trouble walking which he continued to have for the next few days. The leg weakness has gradually improved over time. He denies weakness in one leg more than the other and he felt that his both legs were weak. He also has chronic headaches and takes Fioricet. History of hep C, asymptomatic now. He was last known well at 10/11/2015 at an unknown time. Patient was not administered TPA secondary to delay in arrival. He was admitted for further evaluation and treatment.   SUBJECTIVE (INTERVAL HISTORY) No familiy is at the bedside.  Overall he feels his condition is stable. He denies any focal neurovascular symptoms or prior history of strokes or TIAs   OBJECTIVE Temp:  [97.6 F (36.4 C)-98.4 F (36.9 C)] 98.4 F (36.9 C) (02/09 0635) Pulse Rate:  [72-84] 72 (02/09 0437) Cardiac Rhythm:  [-] Normal sinus rhythm (02/09 0700) Resp:  [13-23] 20 (02/09 0437) BP: (138-190)/(49-82) 138/49 mmHg (02/09 0437) SpO2:  [88 %-97 %] 96 % (02/09 0437) Weight:  [53.343 kg (117 lb 9.6 oz)-54.114 kg (119 lb 4.8 oz)] 53.343 kg (117 lb 9.6 oz) (02/09 0437)  CBC:   Recent Labs Lab 10/15/15 1420  WBC 5.6  HGB 12.8*  HCT 39.3  MCV 90.8  PLT 0000000    Basic Metabolic Panel:   Recent Labs Lab 10/15/15 1420 10/16/15 0235  NA 143 144  K 4.4 4.2  CL 111 107  CO2 21* 28  GLUCOSE 100* 105*  BUN 10 9  CREATININE 0.97 1.04  CALCIUM 8.9 8.9  MG  --  1.8    Lipid Panel:     Component Value Date/Time   CHOL  159 10/16/2015 0235   TRIG 41 10/16/2015 0235   HDL 49 10/16/2015 0235   CHOLHDL 3.2 10/16/2015 0235   VLDL 8 10/16/2015 0235   LDLCALC 102* 10/16/2015 0235   HgbA1c: No results found for: HGBA1C Urine Drug Screen:     Component Value Date/Time   LABOPIA POSITIVE* 10/16/2015 0108   COCAINSCRNUR NONE DETECTED 10/16/2015 0108   LABBENZ NONE DETECTED 10/16/2015 0108   AMPHETMU NONE DETECTED 10/16/2015 0108   THCU POSITIVE* 10/16/2015 0108   LABBARB POSITIVE* 10/16/2015 0108      IMAGING  Dg Chest 2 View 10/15/2015  COPD changes with new opacity in LEFT mid lung 5.3 x 3.0 cm in size; mass not excluded. CT chest with contrast recommended for further evaluation. Enlargement of cardiac silhouette. Bibasilar atelectasis and tiny pleural effusions.   Ct Head Wo Contrast 10/15/2015   Mild atrophy and small vessel disease. No acute stroke or hemorrhage. Asymmetric lateral bulging LEFT cavernous sinus; see discussion above. Consider MRI brain without and with contrast for further evaluation.   Ct Angio Chest Pe W/cm &/or Wo Cm 10/15/2015   Single somewhat linear appearing questionable filling defect in a LEFT lower lobe pulmonary artery, favor artifact or sequela of remote pulmonary embolism ; this does not have the typical appearance of an acute pulmonary embolus. Remaining pulmonary arteries are normal in  appearance. Small BILATERAL pleural effusions with loculation of fluid in the LEFT major fissure accounting for chest radiographic abnormality. Enlargement of cardiac chambers with peribronchial thickening with central infiltrates particularly in the lower lobes raising question of pulmonary edema.   [name] Wo Contrast 10/16/2015   1. 14 mm acute ischemic infarct involving the anterior parasagittal right frontal lobe. No associated hemorrhage or significant mass effect. 2. No other acute intracranial process. No mass lesion identified. Previously question lateral bowing of the left cavernous sinus  appears to be related to mild lateral bowing of the cavernous left ICA. 3. Mild chronic microvascular ischemic disease for patient age.    PHYSICAL EXAM Pleasant middle-aged African-American male currently not in distress. . Afebrile. Head is nontraumatic. Neck is supple without bruit.    Cardiac exam no murmur or gallop. Lungs are clear to auscultation. Distal pulses are well felt. Neurological Exam :  ASSESSMENT/PLAN Mr. Alexis [name] is a 65 y.o. male with history of hypertension, cluster headaches and hepatitis C presenting with respiratory distress and chest pain. MRI showed an incidental right frontal infarct. He did not receive IV t-PA due to delay in arrival.   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 Mild small vessel disease  CT angiogram head and neck pending   Carotid Doppler  Canceled as CTA neck pending   2D Echo  pending   LDL 102  HgbA1c pending  Lovenox 40 mg sq daily for VTE prophylaxis Diet Heart Room service appropriate?: Yes; Fluid consistency:: Thin  aspirin 81 mg daily prior to admission, changed to clopidogrel 75 mg daily . Continue at discharge.  Patient counseled to be compliant with his antithrombotic medications  NAS diet recommended  Ongoing aggressive stroke risk factor management  Therapy recommendations:  No therapy needs  Disposition:  Return home  Hypertension  Stable  Long-term Goal BP 130/90  Hyperlipidemia  Home meds:  No statin  LDL 102, goal < 70  Add lipitor 10 mg daily  Continue statin at discharge  Other Stroke Risk Factors  Advanced age  ETOH use  THC use, urine drug screen this admission positive for THC, opiates and barbiturates  Acute CHF  Other Active Problems  Cluster headaches, present on admission, 5 out of 10 in severity, takes Fioricet at home. Give imitrex 50 mg daily. Works for a lot of cluster HA patients.  If works, can renew.  Possible bronchitis: Chest x-ray was COPD left midline opacity, mass is not excluded  Nausea vomiting and diarrhea  Protein calorie malnutrition with 35 pound weight loss over last 3 months  dysuria  Hospital day # [place] for Pager information 10/16/2015 9:48 AM  I have personally examined this patient, reviewed notes, independently viewed imaging studies, participated in medical decision making and plan of care. I have made any additions or clarifications directly to the above note. Agree with note above. She presented with resp distress and chest pain which seems to have improved . He had transient bilateral leg weakness 5 days ago which seems to have improved. He denies any focal neurovascular symptoms. and MRI scan shows a small incidental right frontal white matter periventricular infarct likely from small vessel disease. he remains at risk for recurrent stroke, TIA, neurological worsening and needs ongoing stroke evaluation and aggressive risk factor control. Continue Plavix for stroke prevention and add statin  for elevated LDL cholesterol  Antony Contras, MD Medical Director [facility] Pager: [phone] 10/16/2015 11:49 AM    To contact Stroke Continuity provider, please refer to [email or web address] After hours, contact General Neurology

## 2015-10-16 NOTE — Progress Notes (Signed)
Patient refused aspirin per rectum. Notified hospitalist. Neurovascular check performed. Stroke swallow screen performed by RR Nurse. Patient passed. Heart healthy diet ordered per previous diet order. Report given to oncoming day nurse. Nurse signing off at this time.

## 2015-10-16 NOTE — Progress Notes (Signed)
Patient has vomited large amount of clear liquid into the trash can. Not able to measure but was a large amount. Patient states he feels that he did not have enough on his stomach when taking medications earlier. Notified hospitalist for an order of an antiemetic that is not by intramuscular route, for patient does not want it that route and would prefer via Intravenous. Awaiting return page or new orders.

## 2015-10-16 NOTE — Progress Notes (Signed)
Initial Nutrition Assessment  DOCUMENTATION CODES:   Severe malnutrition in context of chronic illness  INTERVENTION:  Recommend monitor magnesium, potassium, and phosphorus daily for at least 3 days, MD to replete as needed, as pt is at risk for refeeding syndrome given diarrhea x 2 weeks, poor PO intake PTA, and 27% weight loss in less than 3 months.  Provide Ensure Enlive po TID, each supplement provides 350 kcal and 20 grams of protein Provide Multivitamin with minerals daily Provided and discussed "Suggestions for Increasing Calories and Protein" handout from the Academy of [employer]  NUTRITION DIAGNOSIS:   Malnutrition related to poor appetite, altered GI function as evidenced by severe depletion of muscle mass, severe depletion of body fat, energy intake < 75% for > or equal to 1 month, percent weight loss.   GOAL:   Patient will meet greater than or equal to 90% of their needs   MONITOR:   PO intake, Labs, Weight trends, Skin, I & O's  REASON FOR ASSESSMENT:   Malnutrition Screening Tool, Consult Assessment of nutrition requirement/status  ASSESSMENT:   65 y.o. male with PMH of hypertension, hepatitis C, cluster headache, who presents with shortness of breath, chest pain, cough, headache, nausea, vomiting, diarrhea, dysuria.  Pt states that he has lost from 160 lbs to current weight of 117 lbs within the past 2 1/2 months. This is a 27% weight loss which is severe for time frame). He states that he has had no appetite, sometimes going a few days without eating and sometimes eating small snacks throughout the day. He reports drinking soda, juice, and water well during this time. He denies any symptoms contributing to his poor appetite aside from chronic diarrhea the past 2 weeks. Pt has severe muscle and fat wasting per nutrition focused physical exam.  He states his appetite is a little better today and he ate 75% of his meals.  RD encouraged increasing PO  intake. Pt tried Ensure this morning and is agreeable to drinking it daily.   Labs reviewed.   Diet Order:  Diet Heart Room service appropriate?: Yes; Fluid consistency:: Thin  Skin:  Reviewed, no issues  Last BM:  2/8  Height:   Ht Readings from Last 1 Encounters:  10/15/15 5\' 4"  (1.626 m)    Weight:   Wt Readings from Last 1 Encounters:  10/16/15 117 lb 9.6 oz (53.343 kg)    Ideal Body Weight:  59.1 kg  BMI:  Body mass index is 20.18 kg/(m^2).  Estimated Nutritional Needs:   Kcal:  1700-1900  Protein:  70-80 grams  Fluid:  1.7-1.9 L/day  EDUCATION NEEDS:   No education needs identified at this time  Hana, LDN Inpatient Clinical Dietitian Pager: [phone] After Hours Pager: [phone]

## 2015-10-16 NOTE — Progress Notes (Signed)
Stroke education booklet provided to the patient and teach back, no any symptoms so far, will continue to monitor the patient.

## 2015-10-16 NOTE — Progress Notes (Signed)
OT Cancellation Note  Patient Details Name: Alexis [name] MRN: [id number] DOB: Jul 09, 1951   Cancelled Treatment:    Reason Eval/Treat Not Completed: Other (comment). Pt with 10/15/15 orders AND 10/17/15 orders. PT orders for 10/17/15 that were entered 10/16/15 state "start tomorrow". Will hold off for OT eval at this time and complete eval on latest order date. Thank you for the order.   Chrys Racer , MS, OTR/L, CLT Pager: [phone]  10/16/2015, 2:30 PM

## 2015-10-17 ENCOUNTER — Encounter (HOSPITAL_COMMUNITY): Payer: Self-pay | Admitting: Radiology

## 2015-10-17 ENCOUNTER — Inpatient Hospital Stay (HOSPITAL_COMMUNITY): Payer: Medicare Other

## 2015-10-17 DIAGNOSIS — I639 Cerebral infarction, unspecified: Secondary | ICD-10-CM | POA: Insufficient documentation

## 2015-10-17 DIAGNOSIS — E43 Unspecified severe protein-calorie malnutrition: Secondary | ICD-10-CM | POA: Insufficient documentation

## 2015-10-17 DIAGNOSIS — I110 Hypertensive heart disease with heart failure: Secondary | ICD-10-CM | POA: Diagnosis not present

## 2015-10-17 LAB — BASIC METABOLIC PANEL
Anion gap: 10 (ref 5–15)
BUN: 10 mg/dL (ref 6–20)
CALCIUM: 9.1 mg/dL (ref 8.9–10.3)
CO2: 27 mmol/L (ref 22–32)
CREATININE: 0.92 mg/dL (ref 0.61–1.24)
Chloride: 105 mmol/L (ref 101–111)
GFR calc Af Amer: >60 mL/min (ref >60)
GLUCOSE: 86 mg/dL (ref 65–99)
POTASSIUM: 3.5 mmol/L (ref 3.5–5.1)
SODIUM: 142 mmol/L (ref 135–145)

## 2015-10-17 LAB — TSH: TSH: 1.828 u[IU]/mL (ref 0.350–4.500)

## 2015-10-17 LAB — CBC
HCT: 41.1 % (ref 39.0–52.0)
Hemoglobin: 13.5 g/dL (ref 13.0–17.0)
MCH: 29.9 pg (ref 26.0–34.0)
MCHC: 32.8 g/dL (ref 30.0–36.0)
MCV: 90.9 fL (ref 78.0–100.0)
PLATELETS: 216 10*3/uL (ref 150–400)
RBC: 4.52 MIL/uL (ref 4.22–5.81)
RDW: 15.1 % (ref 11.5–15.5)
WBC: 6.9 10*3/uL (ref 4.0–10.5)

## 2015-10-17 LAB — HEMOGLOBIN A1C
Hgb A1c MFr Bld: 5.3 % (ref 4.8–5.6)
MEAN PLASMA GLUCOSE: 105 mg/dL

## 2015-10-17 LAB — PSA: PSA: 2.97 ng/mL (ref 0.00–4.00)

## 2015-10-17 LAB — GLUCOSE, CAPILLARY
GLUCOSE-CAPILLARY: 93 mg/dL (ref 65–99)
Glucose-Capillary: 83 mg/dL (ref 65–99)
Glucose-Capillary: 106 mg/dL — ABNORMAL HIGH (ref 65–99)

## 2015-10-17 MED ORDER — IOHEXOL 300 MG/ML  SOLN
25.0000 mL | INTRAMUSCULAR | Status: AC
  Administered 2015-10-17 (×2): 25 mL via ORAL

## 2015-10-17 MED ORDER — IOHEXOL 300 MG/ML  SOLN
75.0000 mL | Freq: Once | INTRAMUSCULAR | Status: AC | PRN
  Administered 2015-10-17: 100 mL via INTRAVENOUS

## 2015-10-17 MED ORDER — SUMATRIPTAN SUCCINATE 50 MG PO TABS
50.0000 mg | ORAL_TABLET | ORAL | Status: DC | PRN
  Administered 2015-10-17 – 2015-10-18 (×2): 50 mg via ORAL
  Filled 2015-10-17 (×3): qty 1

## 2015-10-17 NOTE — Progress Notes (Signed)
OT Cancellation Note  Patient Details Name: Alexis [name] MRN: [id number] DOB: 12/01/1950   Cancelled Treatment:    Reason Eval/Treat Not Completed: OT screened, no needs identified, will sign off Spoke with PT [facility] and patient currently at baseline   Vonita Moss   OTR/L Pager: [phone] Office: [phone] .  10/17/2015, 1:26 PM

## 2015-10-17 NOTE — Evaluation (Signed)
Physical Therapy Evaluation/Discharge Patient Details Name: Alexis [name] MRN: [id number] DOB: [birth year] Today's Date: 10/17/2015   History of Present Illness  65 y.o. male with PMH of hypertension, hepatitis C, cluster headache, who presents with shortness of breath, chest pain, cough, headache, nausea, vomiting, diarrhea, dysuria. Medical evaluation revealed the patient has had a recent CVA and is in acute CHR. PMH: hypertension, Hepatitis C.  Clinical Impression  Patient evaluated by Physical Therapy with no further acute PT needs identified. All education has been completed and the patient has no further questions. See below for any follow-up Physical Therapy or equipment needs. PT is signing off. Thank you for this referral.     Follow Up Recommendations No PT follow up    Equipment Recommendations  None recommended by PT    Recommendations for Other Services       Precautions / Restrictions Precautions Precautions: None Restrictions Weight Bearing Restrictions: No      Mobility  Bed Mobility Overal bed mobility: Independent             General bed mobility comments: supine to sit, no rail, bed flat.   Transfers Overall transfer level: Independent Equipment used: None             General transfer comment: no instability with standing.  Ambulation/Gait Ambulation/Gait assistance: Independent Ambulation Distance (Feet): 70 Feet Assistive device: None Gait Pattern/deviations: WFL(Within Functional Limits) Gait velocity: WFL Gait velocity interpretation: at or above normal speed for age/gender General Gait Details: no instability noted with ambulation, patient states that he is moving like ususal but just getting tired.   Stairs Stairs:  (not tested, patient reports feeling confident with stairs. )          Wheelchair Mobility    Modified Rankin (Stroke Patients Only)       Balance Overall balance assessment: Independent                                           Pertinent Vitals/Pain Pain Assessment: No/denies pain    Home Living Family/patient expects to be discharged to:: Private residence Living Arrangements: Alone Available Help at Discharge: Family;Friend(s);Available PRN/intermittently (sister, niece and friends to assist.) Type of Home: Apartment Home Access: Stairs to enter Entrance Stairs-Rails: Right Entrance Stairs-Number of Steps:  (2 flights) Home Layout: One level Home Equipment: None      Prior Function Level of Independence: Independent               Hand Dominance        Extremity/Trunk Assessment               Lower Extremity Assessment: Overall WFL for tasks assessed (bilat. dorsi/plantarflexion 5/5)         Communication   Communication: No difficulties  Cognition Arousal/Alertness: Awake/alert Behavior During Therapy: WFL for tasks assessed/performed Overall Cognitive Status: Within Functional Limits for tasks assessed                      General Comments      Exercises        Assessment/Plan    PT Assessment Patent does not need any further PT services  PT Diagnosis     PT Problem List    PT Treatment Interventions     PT Goals (Current goals can be found in the Care Plan section) Acute Rehab PT  Goals Patient Stated Goal: go back home PT Goal Formulation: With patient Time For Goal Achievement: 10/31/15 Potential to Achieve Goals: Good    Frequency     Barriers to discharge        Co-evaluation               End of Session Equipment Utilized During Treatment: Other (comment) (patient declined using gait belt) Activity Tolerance: Patient tolerated treatment well Patient left: in chair;with call bell/phone within reach Nurse Communication: Mobility status         Time: [id number] PT Time Calculation (min) (ACUTE ONLY): 22 min   Charges:   PT Evaluation $PT Eval Moderate Complexity: 1 Procedure     PT G  Codes:        Cassell Clement, PT, CSCS Pager [phone] Office [phone]  10/17/2015, 9:12 AM

## 2015-10-17 NOTE — Progress Notes (Signed)
[facility]                                                                              Patient Demographics  Alexis [name], is a 65 y.o. male, DOB - November 15, 1950, [id number]  Admit date - 10/15/2015   Admitting Physician Ivor Costa, MD  Outpatient Primary MD for the patient is No PCP Per Patient  LOS - 2   Chief Complaint  Patient presents with  . Shortness of Breath  . Chest Pain      HPI on 10/15/2015 by Dr. Ivor Costa Alexis [name] is a 65 y.o. male with PMH of hypertension, hepatitis C, cluster headache, who presents with shortness of breath, chest pain, cough, headache, nausea, vomiting, diarrhea, dysuria. Patient reports that he has intermittent shortness of breath for years, which has worsened recently. She has cough with yellow colored sputum production. He reports that he started having squeezing chest pain over left lower chest yesterdady. Initially chest pain was 10 out of 10 in severity, currently 2 out of 10 in severity. He feels that deep breath makes chest pain like " blockage in his right side of throat". Patient reports that he has intermittent headache due to cluster headache after he came back from [place]. He is taking Fioicet for his HA. Currently he has headache on left side of his head, 5 out of 10 in severity now. No unilateral weakness, numbness or tingling sensations in his extremities. No vision change or hearing loss. Patient reports that he has been having nausea, vomiting, diarrhea for 2 weeks. He lost 35 pounds over past 3 months. He had 5 bowel movement with loose stool and vomited 3 times today. No abdominal pain. He states that he was treated for UTI with antibiotics 2 weeks ago. He states that he has mild dysuria, but no burning on urination or increased urinary frequency. Regarding his hypertension, he states that he was on lisinopril, but stopped taking it for a while because of side effects of decreasing sexual function. In ED, patient was found to  have BNP 2457, WBC 5.6, temperature normal, oxygen desaturation to 88% on room air, electrolyte and renal function okay, INR 1.13, troponin negative. Patient admitted to inpatient for further evaluation and treatment.  Assessment & Plan   Acute respiratory failure with hypoxia -Likely secondary to bronchitis versus CHF -Chest x-ray showed COPD changes with new opacity in the left midlung -CTA of the chest did not show acute PE; enlargement of the cardiac chambers with peribronchial thickening, question of pulmonary edema. Small bilateral pleural effusions  Acute CHF -BNP 2457 -Echoardiogram EF40-45% -No echocardiogram in our system -Monitor intake and output, daily weight -Continue Lasix  Acute CVA -MRI the brain showed 14 mm acute ischemic infarct involving the anterior parasagittal right frontal lobe -Neurology consultation appreciated -CTA head/neck: no large vessel occlusion -Echocardiogram as above -LDL 102 -HbA1c 5.3 -Continue plavix and aspirin (spoke with neurology, patient may continue with single medication- given that he was on aspirin as an outpatient, will continue with plavix) -PT, OT consulted- no further needs -Follow up with [place] neurology or Dr. [name] if needed  Possible bronchitis -Continue nebulizers as needed, Mucinex, azithromycin -CT  angiogram of chest did not show pneumonia however patient does have productive cough with yellow sputum  Essential hypertension -Given stroke, allow for permissive hypertension  Nausea and vomiting, diarrhea -Resolved -Pending C. Difficile (patient has not had another episode of diarrhea during this admission) -Continue antiemetics as needed  History of hepatitis C -Continue to follow-up with primary care physician -No signs of hepatic encephalopathy  Headache, cluster -Continue pain control and imitrex as needed  Dysuria -UA negative for infection, patient was treated with antibiotics 2 weeks ago for urinary tract  infection  Protein calorie malnutrition -Patient states he's lost 35 pounds in 3 months -Etiology unclear -Nutrition consultation appreciated, continue supplement -PSA 2.97, TSH 1.828 (WNL) -CT abd/pelvis ordered to rule out malignancy -CT chest: loculated pleural effusion- will speak to Pulmonology   Code Status: Full  Family Communication: None at bedside  Disposition Plan: Admitted. Likely discharge in 24 hours  Time Spent in minutes   30 minutes  Procedures  None  Consults   Neurology  Pulmonology via phone  DVT Prophylaxis  Lovenox  Lab Results  Component Value Date   PLT 216 10/17/2015    Medications  Scheduled Meds: . atorvastatin  10 mg Oral q1800  . azithromycin  250 mg Oral Daily  . clopidogrel  75 mg Oral Daily  . dextromethorphan-guaiFENesin  1 tablet Oral BID  . enoxaparin (LOVENOX) injection  40 mg Subcutaneous Q24H  . feeding supplement (ENSURE ENLIVE)  237 mL Oral TID BM  . furosemide  20 mg Intravenous Daily  . multivitamin with minerals  1 tablet Oral Daily  . sodium chloride flush  3 mL Intravenous Q12H  . zolpidem  5 mg Oral Once   Continuous Infusions:  PRN Meds:.sodium chloride, albuterol, butalbital-acetaminophen-caffeine, hydrALAZINE, hydrOXYzine, morphine injection, sodium chloride flush, SUMAtriptan  Antibiotics   Anti-infectives    Start     Dose/Rate Route Frequency Ordered Stop   10/16/15 1000  azithromycin (ZITHROMAX) tablet 250 mg     250 mg Oral Daily 10/15/15 2014 10/20/15 0959   10/15/15 2015  azithromycin (ZITHROMAX) tablet 500 mg     500 mg Oral Daily 10/15/15 2014 10/15/15 2306      Subjective:   Danton Sewer seen and examined today.  Patient states he is overwhelmed, but feels ok.  Does not feel as good as he did yesterday.  Denies headache today, chest pain, shortness of breath, abdominal pain.  Objective:   Filed Vitals:   10/16/15 2010 10/16/15 2351 10/17/15 0235 10/17/15 0544  BP: 168/74 179/78 159/70 168/70   Pulse: 87 82 75   Temp: 98.1 F (36.7 C) 98.2 F (36.8 C)  97.9 F (36.6 C)  TempSrc: Oral Oral  Oral  Resp: 18 18  18   Height:      Weight:    52.935 kg (116 lb 11.2 oz)  SpO2: 100% 98%  100%    Wt Readings from Last 3 Encounters:  10/17/15 52.935 kg (116 lb 11.2 oz)     Intake/Output Summary (Last 24 hours) at 10/17/15 1254 Last data filed at 10/17/15 1138  Gross per 24 hour  Intake    920 ml  Output    950 ml  Net    -30 ml    Exam  General: Well developed, well nourished, NAD  HEENT: NCAT, mucous membranes moist.   Cardiovascular: S1 S2 auscultated, RRR, soft murmur  Respiratory: Clear to auscultation bilaterally  Abdomen: Soft, nontender, nondistended, + bowel sounds  Extremities: warm dry without  cyanosis clubbing or edema  Neuro: AAOx3, nonfocal  Psych: Normal affect and demeanor   Data Review   Micro Results No results found for this or any previous visit (from the past 240 hour(s)).  Radiology Reports Ct Angio Head W/cm &/or Wo Cm  10/16/2015  CLINICAL DATA:  Headache, dizziness, and blurry vision. Acute right anterior frontal lobe parasagittal infarct on MRI. EXAM: CT ANGIOGRAPHY HEAD AND NECK TECHNIQUE: Multidetector CT imaging of the head and neck was performed using the standard protocol during bolus administration of intravenous contrast. Multiplanar CT image reconstructions and MIPs were obtained to evaluate the vascular anatomy. Carotid stenosis measurements (when applicable) are obtained utilizing NASCET criteria, using the distal internal carotid diameter as the denominator. CONTRAST:  9mL OMNIPAQUE IOHEXOL 350 MG/ML SOLN COMPARISON:  Brain MRI 10/15/2015 FINDINGS: CTA NECK Aortic arch: 3 vessel aortic arch. Brachiocephalic and subclavian arteries are widely patent. Right carotid system: Patent without evidence of stenosis, dissection, or significant atherosclerosis. Tortuous and partially retropharyngeal proximal ICA. Left carotid system: Patent  without evidence of stenosis, dissection, or significant atherosclerosis. Tortuous distal cervical ICA. Vertebral arteries: Patent without stenosis. Right vertebral artery is minimally larger than the left. Skeleton: Mild lower cervical disc degeneration. Other neck: Partially visualized small right pleural effusion. Mild asymmetric enlargement of the right palatine tonsil/anterior tonsillar pillar. CTA HEAD Anterior circulation: The internal carotid arteries are patent from skullbase to carotid termini with mild non stenotic calcified plaque. The left cavernous ICA is slightly ectatic. MCAs are patent without evidence of significant proximal stenosis or major branch vessel occlusion. The ACAs are patent without evidence of significant stenosis. The left ACA is dominant. No intracranial aneurysm is identified. Posterior circulation: The vertebral arteries are widely patent to the basilar with the right being mildly dominant. PICA and SCA origins are patent. Basilar artery is patent without stenosis. There is a fetal type origin of the right PCA. A left posterior communicating artery is also present. There is irregularity and mild narrowing of the left P1 segment. Mild PCA branch vessel irregularity is noted bilaterally. Venous sinuses: Patent. Anatomic variants: Fetal type origin of the right PCA. Dominant left ACA. Delayed phase: A small focus of hypodensity is noted in the right frontal lobe anterior to the frontal horn corresponding to the acute infarct on yesterday's MRI. No abnormal intracranial enhancement is identified. No acute intracranial hemorrhage, mass, midline shift, or extra-axial fluid collection is seen. IMPRESSION: 1. No large vessel occlusion. 2. No significant anterior circulation stenosis. 3. Mild left P1 PCA stenosis. 4. Widely patent cervical carotid arteries. 5. Mild asymmetric enlargement of the right palatine tonsil. Correlate with direct visualization. Electronically Signed   By: Logan Bores M.D.   On: 10/16/2015 11:54   Dg Chest 2 View  10/15/2015  CLINICAL DATA:  Intermittent shortness of breath for several years, developed squeezing chest pain yesterday, today with nausea, vomiting, diarrhea, and headache, history hepatitis-C, hypertension EXAM: CHEST  2 VIEW COMPARISON:  05/14/2014 FINDINGS: Enlargement of cardiac silhouette with pulmonary vascular congestion. Tortuosity of thoracic aorta. Emphysematous lungs with central peribronchial thickening. New opacity in mid LEFT lung, 5.3 x 3.0 cm, mass not excluded. Subsegmental atelectasis at both lung bases. No definite acute infiltrate or edema. Tiny bibasilar effusions. Diffuse osseous demineralization without acute bony abnormalities. Orthopedic screws at RIGHT humeral head. IMPRESSION: COPD changes with new opacity in LEFT mid lung 5.3 x 3.0 cm in size; mass not excluded. CT chest with contrast recommended for further evaluation. Enlargement of cardiac silhouette.  Bibasilar atelectasis and tiny pleural effusions. Electronically Signed   By: Lavonia Dana M.D.   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 Lower extremity weakness earlier in the week. Patient is anticoagulated. History of hepatitis and hypertension. EXAM: CT HEAD WITHOUT CONTRAST TECHNIQUE: Contiguous axial images were obtained from the base of the skull through the vertex without intravenous contrast. COMPARISON:  None. FINDINGS: No evidence for acute infarction, hemorrhage, intra-axial mass lesion, hydrocephalus, or extra-axial fluid. Mild cerebral and cerebellar atrophy. Slight hypoattenuation of white matter suggesting small vessel disease. Calvarium intact. Along the lateral margin of the LEFT cavernous sinus, the wall bulges outwardly and there is modest hyperattenuation. It is unclear if this is artifactual asymmetry from partial volume averaging, dolichoectasia or aneurysmal dilatation of the LEFT  internal carotid artery, or a cavernous sinus mass such as a schwannoma, meningioma, or even metastasis. No osseous destructive process is evident. In the setting of a patient with history of grand mal seizures, and proximity to the medial LEFT temporal lobe, consider MRI brain without and with contrast. No sinus or mastoid disease.  Negative orbits. IMPRESSION: Mild atrophy and small vessel disease. No acute stroke or hemorrhage. Asymmetric lateral bulging LEFT cavernous sinus; see discussion above. Consider MRI brain without and with contrast for further evaluation. Electronically Signed   By: Staci Righter M.D.   On: 10/15/2015 17:09   Ct Angio Neck W/cm &/or Wo/cm  10/16/2015  CLINICAL DATA:  Headache, dizziness, and blurry vision. Acute right anterior frontal lobe parasagittal infarct on MRI. EXAM: CT ANGIOGRAPHY HEAD AND NECK TECHNIQUE: Multidetector CT imaging of the head and neck was performed using the standard protocol during bolus administration of intravenous contrast. Multiplanar CT image reconstructions and MIPs were obtained to evaluate the vascular anatomy. Carotid stenosis measurements (when applicable) are obtained utilizing NASCET criteria, using the distal internal carotid diameter as the denominator. CONTRAST:  60mL OMNIPAQUE IOHEXOL 350 MG/ML SOLN COMPARISON:  Brain MRI 10/15/2015 FINDINGS: CTA NECK Aortic arch: 3 vessel aortic arch. Brachiocephalic and subclavian arteries are widely patent. Right carotid system: Patent without evidence of stenosis, dissection, or significant atherosclerosis. Tortuous and partially retropharyngeal proximal ICA. Left carotid system: Patent without evidence of stenosis, dissection, or significant atherosclerosis. Tortuous distal cervical ICA. Vertebral arteries: Patent without stenosis. Right vertebral artery is minimally larger than the left. Skeleton: Mild lower cervical disc degeneration. Other neck: Partially visualized small right pleural effusion. Mild  asymmetric enlargement of the right palatine tonsil/anterior tonsillar pillar. CTA HEAD Anterior circulation: The internal carotid arteries are patent from skullbase to carotid termini with mild non stenotic calcified plaque. The left cavernous ICA is slightly ectatic. MCAs are patent without evidence of significant proximal stenosis or major branch vessel occlusion. The ACAs are patent without evidence of significant stenosis. The left ACA is dominant. No intracranial aneurysm is identified. Posterior circulation: The vertebral arteries are widely patent to the basilar with the right being mildly dominant. PICA and SCA origins are patent. Basilar artery is patent without stenosis. There is a fetal type origin of the right PCA. A left posterior communicating artery is also present. There is irregularity and mild narrowing of the left P1 segment. Mild PCA branch vessel irregularity is noted bilaterally. Venous sinuses: Patent. Anatomic variants: Fetal type origin of the right PCA. Dominant left ACA. Delayed phase: A small focus of hypodensity is noted in the right frontal lobe anterior to the frontal horn corresponding  to the acute infarct on yesterday's MRI. No abnormal intracranial enhancement is identified. No acute intracranial hemorrhage, mass, midline shift, or extra-axial fluid collection is seen. IMPRESSION: 1. No large vessel occlusion. 2. No significant anterior circulation stenosis. 3. Mild left P1 PCA stenosis. 4. Widely patent cervical carotid arteries. 5. Mild asymmetric enlargement of the right palatine tonsil. Correlate with direct visualization. Electronically Signed   By: Logan Bores M.D.   On: 10/16/2015 11:54   Ct Angio Chest Pe W/cm &/or Wo Cm  10/15/2015  CLINICAL DATA:  LEFT side chest pain and shortness of breath, ongoing but increased 1 week ago, history of hypertension, hepatitis-C, anxiety EXAM: CT ANGIOGRAPHY CHEST WITH CONTRAST TECHNIQUE: Multidetector CT imaging of the chest was  performed using the standard protocol during bolus administration of intravenous contrast. Multiplanar CT image reconstructions and MIPs were obtained to evaluate the vascular anatomy. CONTRAST:  156mL OMNIPAQUE IOHEXOL 350 MG/ML SOLN IV COMPARISON:  None FINDINGS: Aorta normal caliber without aneurysm or dissection. Calcified 14 mm diameter gallstone within gallbladder. Remaining visualized upper abdomen unremarkable. Scattered streak artifacts traversing the lower lobes due to inclusion of patient's RIGHT arm within the imaged field. Enlargement of cardiac chambers diffusely. Pulmonary arteries well opacified and patent. Single questionable somewhat linear appearing filling defect is seen adjacent to the wall of a LEFT lower lobe pulmonary artery, series 406, image 168, question artifact or less likely a sequela of a prior pulmonary embolus such as a web or synechiae. This does not have the typical appearance of an acute pulmonary embolus. No other pulmonary arterial perfusion abnormalities are identified. Small BILATERAL pleural effusions. Loculated pleural fluid within the LEFT major fissure, accounting for abnormal chest radiograph finding of questionable mid lung opacity. Peribronchial thickening with question central pulmonary infiltrates in both lower lobes, favor edema over infection. No pneumothorax or acute osseous findings. Review of the MIP images confirms the above findings. IMPRESSION: Single somewhat linear appearing questionable filling defect in a LEFT lower lobe pulmonary artery, favor artifact or sequela of remote pulmonary embolism ; this does not have the typical appearance of an acute pulmonary embolus. Remaining pulmonary arteries are normal in appearance. Small BILATERAL pleural effusions with loculation of fluid in the LEFT major fissure accounting for chest radiographic abnormality. Enlargement of cardiac chambers with peribronchial thickening with central infiltrates particularly in the  lower lobes raising question of pulmonary edema. Electronically Signed   By: Lavonia Dana M.D.   On: 10/15/2015 17:31   [name] [id number] Contrast  10/16/2015  CLINICAL DATA:  Initial evaluation for acute headache. EXAM: MRI HEAD WITHOUT AND WITH CONTRAST TECHNIQUE: Multiplanar, multiecho pulse sequences of the brain and surrounding structures were obtained without and with intravenous contrast. CONTRAST:  76mL MULTIHANCE GADOBENATE DIMEGLUMINE 529 MG/ML IV SOLN COMPARISON:  Prior CT from earlier the same day. FINDINGS: Mild diffuse prominence of CSF containing spaces is compatible with generalized age-related cerebral atrophy. Patchy T2/FLAIR hyperintensity within the periventricular and deep white matter both cerebral hemispheres present, most consistent with chronic small vessel ischemic disease, mild for patient age. Small remote lacunar infarct within the anterior body of the corpus callosum (series 2, image 14). There is a small 14 mm focus of restricted diffusion within the paramedian right frontal lobe, adjacent to the frontal horn of the right lateral ventricle (series 4, image 31). Finding consistent with an acute ischemic infarct. No significant mass effect. No associated hemorrhage. No other abnormal foci of restricted diffusion to suggest acute infarction. Major intracranial vascular  flow voids are maintained. No acute or chronic intracranial hemorrhage. No mass lesion, midline shift, or mass effect. No hydrocephalus. No extra-axial fluid collection. No abnormal enhancement on post-contrast sequences. Previously question lateral bowing of the left cavernous sinus appears to be related to mild lateral bowing of the cavernous segment of the left ICA. Craniocervical junction within normal limits. Pituitary gland normal.  No acute abnormality about the orbits. Paranasal sinuses are clear. No mastoid effusion. Inner ear structures grossly unremarkable. Bone marrow signal intensity within normal limits. No scalp  soft tissue abnormality. IMPRESSION: 1. 14 mm acute ischemic infarct involving the anterior parasagittal right frontal lobe. No associated hemorrhage or significant mass effect. 2. No other acute intracranial process. No mass lesion identified. Previously question lateral bowing of the left cavernous sinus appears to be related to mild lateral bowing of the cavernous left ICA. 3. Mild chronic microvascular ischemic disease for patient age. Electronically Signed   By: Jeannine Boga M.D.   On: 10/16/2015 00:03    CBC  Recent Labs Lab 10/15/15 1420 10/17/15 0420  WBC 5.6 6.9  HGB 12.8* 13.5  HCT 39.3 41.1  PLT 233 216  MCV 90.8 90.9  MCH 29.6 29.9  MCHC 32.6 32.8  RDW 15.1 15.1    Chemistries   Recent Labs Lab 10/15/15 1420 10/15/15 2114 10/16/15 0235 10/17/15 0420  NA 143  --  144 142  K 4.4  --  4.2 3.5  CL 111  --  107 105  CO2 21*  --  28 27  GLUCOSE 100*  --  105* 86  BUN 10  --  9 10  CREATININE 0.97  --  1.04 0.92  CALCIUM 8.9  --  8.9 9.1  MG  --   --  1.8  --   AST  --  31  --   --   ALT  --  21  --   --   ALKPHOS  --  87  --   --   BILITOT  --  1.3*  --   --    ------------------------------------------------------------------------------------------------------------------ estimated creatinine clearance is 59.9 mL/min (by C-G formula based on Cr of 0.92). ------------------------------------------------------------------------------------------------------------------  Recent Labs  10/16/15 0235  HGBA1C 5.3   ------------------------------------------------------------------------------------------------------------------  Recent Labs  10/16/15 0235  CHOL 159  HDL 49  LDLCALC 102*  TRIG 41  CHOLHDL 3.2   ------------------------------------------------------------------------------------------------------------------  Recent Labs  10/17/15 0947  TSH 1.828    ------------------------------------------------------------------------------------------------------------------ No results for input(s): VITAMINB12, FOLATE, FERRITIN, TIBC, IRON, RETICCTPCT in the last 72 hours.  Coagulation profile  Recent Labs Lab 10/15/15 1420  INR 1.13    No results for input(s): DDIMER in the last 72 hours.  Cardiac Enzymes  Recent Labs Lab 10/15/15 2114 10/16/15 0235 10/16/15 0905  TROPONINI 0.03 0.03 0.04*   ------------------------------------------------------------------------------------------------------------------ Invalid input(s): POCBNP    Sharaya Boruff D.O. on 10/17/2015 at 12:54 PM  Between 7am to 7pm - Pager - [phone]  After 7pm go to [email or web address] - password [email or web address]  And look for the night coverage person covering for me after hours  [facility] Office  [phone]

## 2015-10-17 NOTE — Progress Notes (Signed)
During V/S, NT Shirlean Mylar stated that patient was exhibiting slurred speech/stuttering. Upon assessment, pt was bending over in bed. This Probation officer asked if he was ok and patient stared that he gets anxiety attacks and he was having one. Stayed with patient and helped him settle down. Patient showed equal and bilateral strength. Facial symmetry and clear speech. PEARL @ 2 mm and round. Speech clear. Patient remains AAA x 4. Will continue to monitor.

## 2015-10-17 NOTE — Progress Notes (Signed)
STROKE TEAM PROGRESS NOTE   HISTORY OF PRESENT ILLNESS Alexis [name] is an 65 y.o. male patient who presented to the emergency room due to respiratory distress, chest pain symptoms. He had a brain MRI study done in the ER which showed an incidental small acute infarct in the right frontal lobe. Neurology service is consulted for further evaluation. Patient denies any focal vision speech problems or sensorimotor abnormality set this time. But about 5 days ago, apparently he had bilateral leg weakness and trouble walking which he continued to have for the next few days. The leg weakness has gradually improved over time. He denies weakness in one leg more than the other and he felt that his both legs were weak. He also has chronic headaches and takes Fioricet. History of hep C, asymptomatic now. He was last known well at 10/11/2015 at an unknown time. Patient was not administered TPA secondary to delay in arrival. He was admitted for further evaluation and treatment.   SUBJECTIVE (INTERVAL HISTORY) No familiy is at the bedside.  Overall he feels his condition is stable. He denies any focal neurovascular symptoms or prior history of strokes or TIAs. Headache is resolved with imitex   OBJECTIVE Temp:  [97.5 F (36.4 C)-98.2 F (36.8 C)] 97.9 F (36.6 C) (02/10 0544) Pulse Rate:  [75-87] 75 (02/10 0235) Cardiac Rhythm:  [-] Normal sinus rhythm (02/10 0931) Resp:  [18] 18 (02/10 0544) BP: (148-179)/(56-78) 168/70 mmHg (02/10 0544) SpO2:  [98 %-100 %] 100 % (02/10 0544) Weight:  [52.935 kg (116 lb 11.2 oz)] 52.935 kg (116 lb 11.2 oz) (02/10 0544)  CBC:   Recent Labs Lab 10/15/15 1420 10/17/15 0420  WBC 5.6 6.9  HGB 12.8* 13.5  HCT 39.3 41.1  MCV 90.8 90.9  PLT 233 [id number]    Basic Metabolic Panel:   Recent Labs Lab 10/16/15 0235 10/17/15 0420  NA 144 142  K 4.2 3.5  CL 107 105  CO2 28 27  GLUCOSE 105* 86  BUN 9 10  CREATININE 1.04 0.92  CALCIUM 8.9 9.1  MG 1.8  --     Lipid  Panel:     Component Value Date/Time   CHOL 159 10/16/2015 0235   TRIG 41 10/16/2015 0235   HDL 49 10/16/2015 0235   CHOLHDL 3.2 10/16/2015 0235   VLDL 8 10/16/2015 0235   LDLCALC 102* 10/16/2015 0235   HgbA1c:  Lab Results  Component Value Date   HGBA1C 5.3 10/16/2015   Urine Drug Screen:     Component Value Date/Time   LABOPIA POSITIVE* 10/16/2015 0108   COCAINSCRNUR NONE DETECTED 10/16/2015 0108   LABBENZ NONE DETECTED 10/16/2015 0108   AMPHETMU NONE DETECTED 10/16/2015 0108   THCU POSITIVE* 10/16/2015 0108   LABBARB POSITIVE* 10/16/2015 0108      IMAGING  Dg Chest 2 View 10/15/2015  COPD changes with new opacity in LEFT mid lung 5.3 x 3.0 cm in size; mass not excluded. CT chest with contrast recommended for further evaluation. Enlargement of cardiac silhouette. Bibasilar atelectasis and tiny pleural effusions.   Ct Head Wo Contrast 10/15/2015   Mild atrophy and small vessel disease. No acute stroke or hemorrhage. Asymmetric lateral bulging LEFT cavernous sinus; see discussion above. Consider MRI brain without and with contrast for further evaluation.   Ct Angio Chest Pe W/cm &/or Wo Cm 10/15/2015   Single somewhat linear appearing questionable filling defect in a LEFT lower lobe pulmonary artery, favor artifact or sequela of remote pulmonary embolism ; this does  not have the typical appearance of an acute pulmonary embolus. Remaining pulmonary arteries are normal in appearance. Small BILATERAL pleural effusions with loculation of fluid in the LEFT major fissure accounting for chest radiographic abnormality. Enlargement of cardiac chambers with peribronchial thickening with central infiltrates particularly in the lower lobes raising question of pulmonary edema.   Mr Alexis [name] Wo Contrast 10/16/2015   1. 14 mm acute ischemic infarct involving the anterior parasagittal right frontal lobe. No associated hemorrhage or significant mass effect. 2. No other acute intracranial process. No  mass lesion identified. Previously question lateral bowing of the left cavernous sinus appears to be related to mild lateral bowing of the cavernous left ICA. 3. Mild chronic microvascular ischemic disease for patient age.   CTA angio head and neck 1. No large vessel occlusion. 2. No significant anterior circulation stenosis. 3. Mild left P1 PCA stenosis. 4. Widely patent cervical carotid arteries. 5. Mild asymmetric enlargement of the right palatine tonsil. Correlate with direct visualization.  2D Echocardiogram  - Left ventricle: The cavity size was normal. There was moderateconcentric hypertrophy. Systolic function was mildly tomoderately reduced. The estimated ejection fraction was in therange of 40% to 45%. Diffuse hypokinesis. - Aortic valve: Trileaflet; mildly thickened, mildly calcifiedleaflets. There was severe regurgitation. - Mitral valve: There was moderate regurgitation directedposteriorly. - Right ventricle: The cavity size was mildly dilated. Wallthickness was normal. - Right atrium: The atrium was moderately dilated. - Tricuspid valve: There was moderate regurgitation. - Pulmonary arteries: Systolic pressure was severely increased. PApeak pressure: 81 mm Hg (S).   PHYSICAL EXAM Pleasant middle-aged African-American male currently not in distress. . Afebrile. Head is nontraumatic. Neck is supple without bruit.    Cardiac exam no murmur or gallop. Lungs are clear to auscultation. Distal pulses are well felt. Neurological Exam :  ASSESSMENT/PLAN Mr. Alexis [name] is a 65 y.o. male with history of hypertension, cluster headaches and hepatitis C presenting with respiratory distress and chest pain. MRI showed an incidental right frontal infarct. He did not receive IV t-PA due to delay in arrival.    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 Mild small vessel disease  CT  angiogram head and neck pending   Carotid Doppler  Canceled as CTA neck pending   2D Echo  EF 40-45%, no source of embolus  LDL 102  HgbA1c 5.3  Lovenox 40 mg sq daily for VTE prophylaxis Diet Heart Room service appropriate?: Yes; Fluid consistency:: Thin  aspirin 81 mg daily prior to admission, changed to clopidogrel 75 mg daily . Continue at discharge.  Patient counseled to be compliant with his antithrombotic medications  NAS diet recommended  Ongoing aggressive stroke risk factor management  Therapy recommendations:  No therapy needs  Disposition:  Return home  Hypertension  Slightly elevated  Long-term Goal BP 130/90  Hyperlipidemia  Home meds:  No statin  LDL 102, goal < 70  New lipitor 10 mg daily  Continue statin at discharge  Other Stroke Risk Factors  Advanced age  ETOH use  THC use, urine drug screen this admission positive for THC, opiates and barbiturates. Advised to stop use  Acute CHF  Other Active Problems  Cluster headaches, present on admission, 5 out of 10 in severity, takes Fioricet at home. Give imitrex 50 mg daily. Works for a lot of cluster HA patients. patient had good relief from Imitrex. Can prescribe at discharge.  Possible  bronchitis: Chest x-ray was COPD left midline opacity, mass is not excluded  Nausea vomiting and diarrhea  Protein calorie malnutrition with 35 pound weight loss over last 3 months  dysuria  NOTHING FURTHER TO ADD FROM THE STROKE STANDPOINT  Patient has a 10-15% risk of having another stroke over the next year, the highest risk is within 2 weeks of the most recent stroke/TIA (risk of having a stroke following a stroke or TIA is the same).  Ongoing risk factor control by Primary Care Physician  Stroke Service will sign off. Please call should any needs arise.  No stroke Follow-up indicated  Hospital day # [place] for Pager information 10/17/2015 10:46 AM   I have personally examined this patient, reviewed notes, independently viewed imaging studies, participated in medical decision making and plan of care. I have made any additions or clarifications directly to the above note. Agree with note above.  Dc home. F/u only if necessary  Antony Contras, MD Medical Director [facility] Pager: [phone] 10/17/2015 1:22 PM   To contact Stroke Continuity provider, please refer to [email or web address] After hours, contact General Neurology

## 2015-10-18 DIAGNOSIS — E43 Unspecified severe protein-calorie malnutrition: Secondary | ICD-10-CM

## 2015-10-18 DIAGNOSIS — I5021 Acute systolic (congestive) heart failure: Secondary | ICD-10-CM

## 2015-10-18 LAB — CBC
HCT: 41.9 % (ref 39.0–52.0)
HEMOGLOBIN: 13.8 g/dL (ref 13.0–17.0)
MCH: 29.6 pg (ref 26.0–34.0)
MCHC: 32.9 g/dL (ref 30.0–36.0)
MCV: 89.9 fL (ref 78.0–100.0)
PLATELETS: 215 10*3/uL (ref 150–400)
RBC: 4.66 MIL/uL (ref 4.22–5.81)
RDW: 14.8 % (ref 11.5–15.5)
WBC: 5.4 10*3/uL (ref 4.0–10.5)

## 2015-10-18 LAB — BASIC METABOLIC PANEL
ANION GAP: 11 (ref 5–15)
BUN: 9 mg/dL (ref 6–20)
CHLORIDE: 104 mmol/L (ref 101–111)
CO2: 27 mmol/L (ref 22–32)
CREATININE: 0.82 mg/dL (ref 0.61–1.24)
Calcium: 9.1 mg/dL (ref 8.9–10.3)
GFR calc non Af Amer: >60 mL/min (ref >60)
Glucose, Bld: 84 mg/dL (ref 65–99)
POTASSIUM: 3.7 mmol/L (ref 3.5–5.1)
SODIUM: 142 mmol/L (ref 135–145)

## 2015-10-18 MED ORDER — ENSURE ENLIVE PO LIQD: 237.0000 mL | Freq: Three times a day (TID) | ORAL | Status: DC

## 2015-10-18 MED ORDER — ATORVASTATIN CALCIUM 10 MG PO TABS: 10.0000 mg | ORAL_TABLET | Freq: Every day | ORAL | Status: AC

## 2015-10-18 MED ORDER — SUMATRIPTAN SUCCINATE 50 MG PO TABS: ORAL_TABLET | ORAL | Status: DC

## 2015-10-18 MED ORDER — DM-GUAIFENESIN ER 30-600 MG PO TB12: 1.0000 | ORAL_TABLET | Freq: Two times a day (BID) | ORAL | Status: DC

## 2015-10-18 MED ORDER — LISINOPRIL 40 MG PO TABS: 40.0000 mg | ORAL_TABLET | Freq: Every day | ORAL | Status: DC

## 2015-10-18 MED ORDER — AZITHROMYCIN 250 MG PO TABS: 250.0000 mg | ORAL_TABLET | Freq: Every day | ORAL | Status: DC

## 2015-10-18 MED ORDER — CLOPIDOGREL BISULFATE 75 MG PO TABS: 75.0000 mg | ORAL_TABLET | Freq: Every day | ORAL | Status: DC

## 2015-10-18 MED ORDER — FUROSEMIDE 20 MG PO TABS: 20.0000 mg | ORAL_TABLET | Freq: Every day | ORAL | Status: DC

## 2015-10-18 NOTE — Discharge Summary (Signed)
Pt got discharged to home, discharge instructions provided and patient showed understanding to it, IV taken out,Telemonitor DC,pt left unit in wheelchair with all of the belongings accompanied with a family member  

## 2015-10-18 NOTE — Discharge Summary (Signed)
Physician Discharge Summary  Alexis [name] [id number] DOB: October 12, 1950 DOA: 10/15/2015  PCP: No PCP Per Patient  Admit date: 10/15/2015 Discharge date: 10/18/2015  Time spent: 45 minutes  Recommendations for Outpatient Follow-up:  Patient will be discharged to home.  Patient will need to follow up with primary care provider within one week of discharge, repeat BMP.  Patient should continue medications as prescribed.  Patient should follow a heart healthy diet.   Discharge Diagnoses:  Acute respiratory failure with hypoxia Acute systolic CHF Acute CVA Possible Bronchitis Essential hypertension Nausea and vomiting, diarrhea Hepatitis the Cluster headache Dysuria Severe protein calorie malnutrition  Discharge Condition: Stable  Diet recommendation: heart healthy  Filed Weights   10/16/15 0437 10/17/15 0544 10/18/15 0442  Weight: 53.343 kg (117 lb 9.6 oz) 52.935 kg (116 lb 11.2 oz) 51.801 kg (114 lb 3.2 oz)    History of present illness:  on 10/15/2015 by Dr. Ivor Costa Alexis [name] is a 65 y.o. male with PMH of hypertension, hepatitis C, cluster headache, who presents with shortness of breath, chest pain, cough, headache, nausea, vomiting, diarrhea, dysuria. Patient reports that he has intermittent shortness of breath for years, which has worsened recently. She has cough with yellow colored sputum production. He reports that he started having squeezing chest pain over left lower chest yesterdady. Initially chest pain was 10 out of 10 in severity, currently 2 out of 10 in severity. He feels that deep breath makes chest pain like " blockage in his right side of throat". Patient reports that he has intermittent headache due to cluster headache after he came back from [place]. He is taking Fioicet for his HA. Currently he has headache on left side of his head, 5 out of 10 in severity now. No unilateral weakness, numbness or tingling sensations in his extremities. No vision change or  hearing loss. Patient reports that he has been having nausea, vomiting, diarrhea for 2 weeks. He lost 35 pounds over past 3 months. He had 5 bowel movement with loose stool and vomited 3 times today. No abdominal pain. He states that he was treated for UTI with antibiotics 2 weeks ago. He states that he has mild dysuria, but no burning on urination or increased urinary frequency. Regarding his hypertension, he states that he was on lisinopril, but stopped taking it for a while because of side effects of decreasing sexual function. In ED, patient was found to have BNP 2457, WBC 5.6, temperature normal, oxygen desaturation to 88% on room air, electrolyte and renal function okay, INR 1.13, troponin negative. Patient admitted to inpatient for further evaluation and treatment.  Hospital Course:  Acute respiratory failure with hypoxia -Resolved -Likely secondary to bronchitis versus CHF -Chest x-ray showed COPD changes with new opacity in the left midlung -CTA of the chest did not show acute PE; enlargement of the cardiac chambers with peribronchial thickening, question of pulmonary edema. Small bilateral pleural effusions  Acute systolic CHF -BNP [id number] -Echoardiogram EF40-45% -No echocardiogram in our system -Monitor intake and output, daily weight -Continue Lasix, lisinopril (initially held due to CVA)  Acute CVA -MRI the brain showed 14 mm acute ischemic infarct involving the anterior parasagittal right frontal lobe -Neurology consultation appreciated -CTA head/neck: no large vessel occlusion -Echocardiogram as above -LDL 102 -HbA1c 5.3 -Continue plavix and aspirin (spoke with neurology, patient may continue with single medication- given that he was on aspirin as an outpatient, will continue with plavix) -PT, OT consulted- no further needs -Follow up with [facility] neurology  or Dr. [name] if needed  Possible bronchitis -Resolved -Continue nebulizers as needed, Mucinex, azithromycin -CT angiogram  of chest did not show pneumonia however patient does have productive cough with yellow sputum  Essential hypertension -Given stroke, allow for permissive hypertension  Nausea and vomiting, diarrhea -Resolved -Pending C. Difficile (patient has not had another episode of diarrhea during this admission) -Continue antiemetics as needed  History of hepatitis C -Continue to follow-up with primary care physician -No signs of hepatic encephalopathy  Headache, cluster -Continue pain control and imitrex as needed  Dysuria -UA negative for infection, patient was treated with antibiotics 2 weeks ago for urinary tract infection  Protein calorie malnutrition, severe -Patient states he's lost 35 pounds in 3 months -Etiology unclear -Nutrition consultation appreciated, continue supplement -PSA 2.97, TSH 1.828 (WNL) -CT abd/pelvis: no evidence of acute intra-abdominal or intrapelvic abnormality.  Incidental note made of small Bochdalek's hernias B/L  -CT chest: loculated pleural effusion- spoke with pulmonology via phone, Dr. Halford Chessman.  No follow up or workup needed at this time, effusions very small -Follow up with PCP for further workup  Procedures  Echocardiogram  Consults  Neurology  Pulmonology via phone  Discharge Exam: Filed Vitals:   10/17/15 2022 10/18/15 0442  BP: 182/85 156/70  Pulse: 88 73  Temp: 98.2 F (36.8 C) 97.7 F (36.5 C)  Resp: 18 18    Exam  General: Well developed, well nourished, NAD  HEENT: NCAT, mucous membranes moist.   Cardiovascular: S1 S2 auscultated, RRR, 2/6 murmur  Respiratory: Clear to auscultation bilaterally  Abdomen: Soft, nontender, nondistended, + bowel sounds  Extremities: warm dry without cyanosis clubbing or edema  Neuro: AAOx3, nonfocal, speech clear  Psych: Normal affect and demeanor, pleasant  Discharge Instructions      Discharge Instructions    Discharge instructions    Complete by:  As directed   Patient will be  discharged to home.  Patient will need to follow up with primary care provider within one week of discharge, repeat BMP.  Patient should continue medications as prescribed.  Patient should follow a heart healthy diet.            Medication List    STOP taking these medications        aspirin EC 81 MG tablet      TAKE these medications        albuterol 108 (90 Base) MCG/ACT inhaler  Commonly known as:  PROVENTIL HFA;VENTOLIN HFA  Inhale 1 puff into the lungs every 6 (six) hours as needed for wheezing or shortness of breath.     atorvastatin 10 MG tablet  Commonly known as:  LIPITOR  Take 1 tablet (10 mg total) by mouth daily at 6 PM.     azithromycin 250 MG tablet  Commonly known as:  ZITHROMAX  Take 1 tablet (250 mg total) by mouth daily.     butalbital-acetaminophen-caffeine 50-325-40 MG tablet  Commonly known as:  FIORICET, ESGIC  Take 1 tablet by mouth 2 (two) times daily as needed for headache.     clopidogrel 75 MG tablet  Commonly known as:  PLAVIX  Take 1 tablet (75 mg total) by mouth daily.     dextromethorphan-guaiFENesin 30-600 MG 12hr tablet  Commonly known as:  MUCINEX DM  Take 1 tablet by mouth 2 (two) times daily.     feeding supplement (ENSURE ENLIVE) Liqd  Take 237 mLs by mouth 3 (three) times daily between meals.     furosemide 20 MG tablet  Commonly known as:  LASIX  Take 1 tablet (20 mg total) by mouth daily.     lisinopril 40 MG tablet  Commonly known as:  PRINIVIL,ZESTRIL  Take 1 tablet (40 mg total) by mouth daily.     SUMAtriptan 50 MG tablet  Commonly known as:  IMITREX  May repeat in 2 hours if headache persists or recurs. Do not exceed 2 doses in 24 hours.       Allergies  Allergen Reactions  . Lisinopril     Decrease sexual function      The results of significant diagnostics from this hospitalization (including imaging, microbiology, ancillary and laboratory) are listed below for reference.    Significant Diagnostic  Studies: Ct Angio Head W/cm &/or Wo Cm  10/16/2015  CLINICAL DATA:  Headache, dizziness, and blurry vision. Acute right anterior frontal lobe parasagittal infarct on MRI. EXAM: CT ANGIOGRAPHY HEAD AND NECK TECHNIQUE: Multidetector CT imaging of the head and neck was performed using the standard protocol during bolus administration of intravenous contrast. Multiplanar CT image reconstructions and MIPs were obtained to evaluate the vascular anatomy. Carotid stenosis measurements (when applicable) are obtained utilizing NASCET criteria, using the distal internal carotid diameter as the denominator. CONTRAST:  28mL OMNIPAQUE IOHEXOL 350 MG/ML SOLN COMPARISON:  Brain MRI 10/15/2015 FINDINGS: CTA NECK Aortic arch: 3 vessel aortic arch. Brachiocephalic and subclavian arteries are widely patent. Right carotid system: Patent without evidence of stenosis, dissection, or significant atherosclerosis. Tortuous and partially retropharyngeal proximal ICA. Left carotid system: Patent without evidence of stenosis, dissection, or significant atherosclerosis. Tortuous distal cervical ICA. Vertebral arteries: Patent without stenosis. Right vertebral artery is minimally larger than the left. Skeleton: Mild lower cervical disc degeneration. Other neck: Partially visualized small right pleural effusion. Mild asymmetric enlargement of the right palatine tonsil/anterior tonsillar pillar. CTA HEAD Anterior circulation: The internal carotid arteries are patent from skullbase to carotid termini with mild non stenotic calcified plaque. The left cavernous ICA is slightly ectatic. MCAs are patent without evidence of significant proximal stenosis or major branch vessel occlusion. The ACAs are patent without evidence of significant stenosis. The left ACA is dominant. No intracranial aneurysm is identified. Posterior circulation: The vertebral arteries are widely patent to the basilar with the right being mildly dominant. PICA and SCA origins are  patent. Basilar artery is patent without stenosis. There is a fetal type origin of the right PCA. A left posterior communicating artery is also present. There is irregularity and mild narrowing of the left P1 segment. Mild PCA branch vessel irregularity is noted bilaterally. Venous sinuses: Patent. Anatomic variants: Fetal type origin of the right PCA. Dominant left ACA. Delayed phase: A small focus of hypodensity is noted in the right frontal lobe anterior to the frontal horn corresponding to the acute infarct on yesterday's MRI. No abnormal intracranial enhancement is identified. No acute intracranial hemorrhage, mass, midline shift, or extra-axial fluid collection is seen. IMPRESSION: 1. No large vessel occlusion. 2. No significant anterior circulation stenosis. 3. Mild left P1 PCA stenosis. 4. Widely patent cervical carotid arteries. 5. Mild asymmetric enlargement of the right palatine tonsil. Correlate with direct visualization. Electronically Signed   By: Logan Bores M.D.   On: 10/16/2015 11:54   Dg Chest 2 View  10/15/2015  CLINICAL DATA:  Intermittent shortness of breath for several years, developed squeezing chest pain yesterday, today with nausea, vomiting, diarrhea, and headache, history hepatitis-C, hypertension EXAM: CHEST  2 VIEW COMPARISON:  05/14/2014 FINDINGS: Enlargement of cardiac silhouette with pulmonary vascular  congestion. Tortuosity of thoracic aorta. Emphysematous lungs with central peribronchial thickening. New opacity in mid LEFT lung, 5.3 x 3.0 cm, mass not excluded. Subsegmental atelectasis at both lung bases. No definite acute infiltrate or edema. Tiny bibasilar effusions. Diffuse osseous demineralization without acute bony abnormalities. Orthopedic screws at RIGHT humeral head. IMPRESSION: COPD changes with new opacity in LEFT mid lung 5.3 x 3.0 cm in size; mass not excluded. CT chest with contrast recommended for further evaluation. Enlargement of cardiac silhouette. Bibasilar  atelectasis and tiny pleural effusions. Electronically Signed   By: Lavonia Dana M.D.   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 Lower extremity weakness earlier in the week. Patient is anticoagulated. History of hepatitis and hypertension. EXAM: CT HEAD WITHOUT CONTRAST TECHNIQUE: Contiguous axial images were obtained from the base of the skull through the vertex without intravenous contrast. COMPARISON:  None. FINDINGS: No evidence for acute infarction, hemorrhage, intra-axial mass lesion, hydrocephalus, or extra-axial fluid. Mild cerebral and cerebellar atrophy. Slight hypoattenuation of white matter suggesting small vessel disease. Calvarium intact. Along the lateral margin of the LEFT cavernous sinus, the wall bulges outwardly and there is modest hyperattenuation. It is unclear if this is artifactual asymmetry from partial volume averaging, dolichoectasia or aneurysmal dilatation of the LEFT internal carotid artery, or a cavernous sinus mass such as a schwannoma, meningioma, or even metastasis. No osseous destructive process is evident. In the setting of a patient with history of grand mal seizures, and proximity to the medial LEFT temporal lobe, consider MRI brain without and with contrast. No sinus or mastoid disease.  Negative orbits. IMPRESSION: Mild atrophy and small vessel disease. No acute stroke or hemorrhage. Asymmetric lateral bulging LEFT cavernous sinus; see discussion above. Consider MRI brain without and with contrast for further evaluation. Electronically Signed   By: Staci Righter M.D.   On: 10/15/2015 17:09   Ct Angio Neck W/cm &/or Wo/cm  10/16/2015  CLINICAL DATA:  Headache, dizziness, and blurry vision. Acute right anterior frontal lobe parasagittal infarct on MRI. EXAM: CT ANGIOGRAPHY HEAD AND NECK TECHNIQUE: Multidetector CT imaging of the head and neck was performed using the standard protocol during bolus  administration of intravenous contrast. Multiplanar CT image reconstructions and MIPs were obtained to evaluate the vascular anatomy. Carotid stenosis measurements (when applicable) are obtained utilizing NASCET criteria, using the distal internal carotid diameter as the denominator. CONTRAST:  49mL OMNIPAQUE IOHEXOL 350 MG/ML SOLN COMPARISON:  Brain MRI 10/15/2015 FINDINGS: CTA NECK Aortic arch: 3 vessel aortic arch. Brachiocephalic and subclavian arteries are widely patent. Right carotid system: Patent without evidence of stenosis, dissection, or significant atherosclerosis. Tortuous and partially retropharyngeal proximal ICA. Left carotid system: Patent without evidence of stenosis, dissection, or significant atherosclerosis. Tortuous distal cervical ICA. Vertebral arteries: Patent without stenosis. Right vertebral artery is minimally larger than the left. Skeleton: Mild lower cervical disc degeneration. Other neck: Partially visualized small right pleural effusion. Mild asymmetric enlargement of the right palatine tonsil/anterior tonsillar pillar. CTA HEAD Anterior circulation: The internal carotid arteries are patent from skullbase to carotid termini with mild non stenotic calcified plaque. The left cavernous ICA is slightly ectatic. MCAs are patent without evidence of significant proximal stenosis or major branch vessel occlusion. The ACAs are patent without evidence of significant stenosis. The left ACA is dominant. No intracranial aneurysm is identified. Posterior circulation: The vertebral arteries are widely patent to the basilar with the right being mildly dominant. PICA  and SCA origins are patent. Basilar artery is patent without stenosis. There is a fetal type origin of the right PCA. A left posterior communicating artery is also present. There is irregularity and mild narrowing of the left P1 segment. Mild PCA branch vessel irregularity is noted bilaterally. Venous sinuses: Patent. Anatomic variants:  Fetal type origin of the right PCA. Dominant left ACA. Delayed phase: A small focus of hypodensity is noted in the right frontal lobe anterior to the frontal horn corresponding to the acute infarct on yesterday's MRI. No abnormal intracranial enhancement is identified. No acute intracranial hemorrhage, mass, midline shift, or extra-axial fluid collection is seen. IMPRESSION: 1. No large vessel occlusion. 2. No significant anterior circulation stenosis. 3. Mild left P1 PCA stenosis. 4. Widely patent cervical carotid arteries. 5. Mild asymmetric enlargement of the right palatine tonsil. Correlate with direct visualization. Electronically Signed   By: Logan Bores M.D.   On: 10/16/2015 11:54   Ct Angio Chest Pe W/cm &/or Wo Cm  10/15/2015  CLINICAL DATA:  LEFT side chest pain and shortness of breath, ongoing but increased 1 week ago, history of hypertension, hepatitis-C, anxiety EXAM: CT ANGIOGRAPHY CHEST WITH CONTRAST TECHNIQUE: Multidetector CT imaging of the chest was performed using the standard protocol during bolus administration of intravenous contrast. Multiplanar CT image reconstructions and MIPs were obtained to evaluate the vascular anatomy. CONTRAST:  123mL OMNIPAQUE IOHEXOL 350 MG/ML SOLN IV COMPARISON:  None FINDINGS: Aorta normal caliber without aneurysm or dissection. Calcified 14 mm diameter gallstone within gallbladder. Remaining visualized upper abdomen unremarkable. Scattered streak artifacts traversing the lower lobes due to inclusion of patient's RIGHT arm within the imaged field. Enlargement of cardiac chambers diffusely. Pulmonary arteries well opacified and patent. Single questionable somewhat linear appearing filling defect is seen adjacent to the wall of a LEFT lower lobe pulmonary artery, series 406, image 168, question artifact or less likely a sequela of a prior pulmonary embolus such as a web or synechiae. This does not have the typical appearance of an acute pulmonary embolus. No other  pulmonary arterial perfusion abnormalities are identified. Small BILATERAL pleural effusions. Loculated pleural fluid within the LEFT major fissure, accounting for abnormal chest radiograph finding of questionable mid lung opacity. Peribronchial thickening with question central pulmonary infiltrates in both lower lobes, favor edema over infection. No pneumothorax or acute osseous findings. Review of the MIP images confirms the above findings. IMPRESSION: Single somewhat linear appearing questionable filling defect in a LEFT lower lobe pulmonary artery, favor artifact or sequela of remote pulmonary embolism ; this does not have the typical appearance of an acute pulmonary embolus. Remaining pulmonary arteries are normal in appearance. Small BILATERAL pleural effusions with loculation of fluid in the LEFT major fissure accounting for chest radiographic abnormality. Enlargement of cardiac chambers with peribronchial thickening with central infiltrates particularly in the lower lobes raising question of pulmonary edema. Electronically Signed   By: Lavonia Dana M.D.   On: 10/15/2015 17:31   [name] [id number] Contrast  10/16/2015  CLINICAL DATA:  Initial evaluation for acute headache. EXAM: MRI HEAD WITHOUT AND WITH CONTRAST TECHNIQUE: Multiplanar, multiecho pulse sequences of the brain and surrounding structures were obtained without and with intravenous contrast. CONTRAST:  9mL MULTIHANCE GADOBENATE DIMEGLUMINE 529 MG/ML IV SOLN COMPARISON:  Prior CT from earlier the same day. FINDINGS: Mild diffuse prominence of CSF containing spaces is compatible with generalized age-related cerebral atrophy. Patchy T2/FLAIR hyperintensity within the periventricular and deep white matter both cerebral hemispheres present, most consistent with  chronic small vessel ischemic disease, mild for patient age. Small remote lacunar infarct within the anterior body of the corpus callosum (series 2, image 14). There is a small 14 mm focus of  restricted diffusion within the paramedian right frontal lobe, adjacent to the frontal horn of the right lateral ventricle (series 4, image 31). Finding consistent with an acute ischemic infarct. No significant mass effect. No associated hemorrhage. No other abnormal foci of restricted diffusion to suggest acute infarction. Major intracranial vascular flow voids are maintained. No acute or chronic intracranial hemorrhage. No mass lesion, midline shift, or mass effect. No hydrocephalus. No extra-axial fluid collection. No abnormal enhancement on post-contrast sequences. Previously question lateral bowing of the left cavernous sinus appears to be related to mild lateral bowing of the cavernous segment of the left ICA. Craniocervical junction within normal limits. Pituitary gland normal.  No acute abnormality about the orbits. Paranasal sinuses are clear. No mastoid effusion. Inner ear structures grossly unremarkable. Bone marrow signal intensity within normal limits. No scalp soft tissue abnormality. IMPRESSION: 1. 14 mm acute ischemic infarct involving the anterior parasagittal right frontal lobe. No associated hemorrhage or significant mass effect. 2. No other acute intracranial process. No mass lesion identified. Previously question lateral bowing of the left cavernous sinus appears to be related to mild lateral bowing of the cavernous left ICA. 3. Mild chronic microvascular ischemic disease for patient age. Electronically Signed   By: Jeannine Boga M.D.   On: 10/16/2015 00:03   Ct Abdomen Pelvis W Contrast  10/17/2015  ADDENDUM REPORT: 10/17/2015 20:31 ADDENDUM: Incidental note made of small Bochdalek's hernias bilaterally. Electronically Signed   By: Franki Cabot M.D.   On: 10/17/2015 20:31  10/17/2015  CLINICAL DATA:  Nausea, vomiting and diarrhea for 3 weeks. Weight loss. Possible colitis, cancer or Crohn's. EXAM: CT ABDOMEN AND PELVIS WITH CONTRAST TECHNIQUE: Multidetector CT imaging of the abdomen  and pelvis was performed using the standard protocol following bolus administration of intravenous contrast. CONTRAST:  138mL OMNIPAQUE IOHEXOL 300 MG/ML  SOLN COMPARISON:  CT abdomen dated 10/11/2009. FINDINGS: Study somewhat limited as the IV failed during the initial contrast administration requiring that the study be stopped and restarted. This limits characterization of the solid organs. Lower chest: Heart is enlarged, as also described on an earlier chest x-ray dated 10/15/2015. Adjacent lung bases are clear. Hepatobiliary: Multiple stones within the gallbladder, largest measuring 1.4 cm greatest dimension. No evidence of cholecystitis. Liver appears normal. No bile duct dilatation. Pancreas: No mass, inflammatory changes, or other significant abnormality. Spleen: Within normal limits in size and appearance. Adrenals/Urinary Tract: Adrenal glands appear normal. Bilateral renal cysts were better appreciated on the previous abdomen CT from 2011. No renal stone or hydronephrosis. Stomach/Bowel: Bowel is normal in caliber. Atypically dense contrast within the colon limits characterization of its walls but there is no evidence of colonic wall thickening or colonic wall inflammation. Small bowel appears normal. Stomach appears normal. Vascular/Lymphatic: No pathologically enlarged lymph nodes. No evidence of abdominal aortic aneurysm. Reproductive: Prostate gland mildly prominent in size causing slight mass effect on the bladder base. Other: No free fluid or abscess collection seen. No free intraperitoneal air. Musculoskeletal: No suspicious bone lesions identified. Mild degenerative change within the spine. Superficial soft tissues are unremarkable. IMPRESSION: 1. No evidence of acute intra-abdominal or intrapelvic abnormality. No evidence of colitis or other acute abnormality of the bowel. No evidence of acute solid organ abnormality or evidence of neoplastic process, with mild study limitations detailed above. 2.  Cholelithiasis without evidence of acute cholecystitis. 3. Cardiomegaly. Electronically Signed: By: Franki Cabot M.D. On: 10/17/2015 20:20    Microbiology: No results found for this or any previous visit (from the past 240 hour(s)).   Labs: Basic Metabolic Panel:  Recent Labs Lab 10/15/15 1420 10/16/15 0235 10/17/15 0420 10/18/15 0413  NA 143 144 142 142  K 4.4 4.2 3.5 3.7  CL 111 107 105 104  CO2 21* 28 27 27   GLUCOSE 100* 105* 86 84  BUN 10 9 10 9   CREATININE 0.97 1.04 0.92 0.82  CALCIUM 8.9 8.9 9.1 9.1  MG  --  1.8  --   --    Liver Function Tests:  Recent Labs Lab 10/15/15 2114  AST 31  ALT 21  ALKPHOS 87  BILITOT 1.3*  PROT 7.4  ALBUMIN 3.7   No results for input(s): LIPASE, AMYLASE in the last 168 hours.  Recent Labs Lab 10/15/15 2114  AMMONIA 28   CBC:  Recent Labs Lab 10/15/15 1420 10/17/15 0420 10/18/15 0413  WBC 5.6 6.9 5.4  HGB 12.8* 13.5 13.8  HCT 39.3 41.1 41.9  MCV 90.8 90.9 89.9  PLT 233 216 215   Cardiac Enzymes:  Recent Labs Lab 10/15/15 2114 10/16/15 0235 10/16/15 0905  TROPONINI 0.03 0.03 0.04*   BNP: BNP (last 3 results)  Recent Labs  10/15/15 1420  BNP 2457.0*    ProBNP (last 3 results) No results for input(s): PROBNP in the last 8760 hours.  CBG:  Recent Labs Lab 10/16/15 1551 10/16/15 2107 10/16/15 2349 10/17/15 0550 10/17/15 1127  GLUCAP 100* 76 93 83 106*       Signed:  Raizy Auzenne  [facility] 10/18/2015, 10:22 AM

## 2016-02-05 ENCOUNTER — Encounter (HOSPITAL_COMMUNITY): Payer: Self-pay | Admitting: Emergency Medicine

## 2016-02-05 ENCOUNTER — Emergency Department (HOSPITAL_COMMUNITY): Payer: Medicare HMO

## 2016-02-05 ENCOUNTER — Inpatient Hospital Stay (HOSPITAL_COMMUNITY)
Admission: EM | Admit: 2016-02-05 | Discharge: 2016-02-11 | DRG: 286 | Disposition: A | Discharge status: Home / Self Care | Payer: Medicare HMO | Attending: Family Medicine | Admitting: Family Medicine

## 2016-02-05 DIAGNOSIS — I728 Aneurysm of other specified arteries: Secondary | ICD-10-CM | POA: Diagnosis not present

## 2016-02-05 DIAGNOSIS — R64 Cachexia: Secondary | ICD-10-CM | POA: Diagnosis present

## 2016-02-05 DIAGNOSIS — Z8249 Family history of ischemic heart disease and other diseases of the circulatory system: Secondary | ICD-10-CM

## 2016-02-05 DIAGNOSIS — Z87891 Personal history of nicotine dependence: Secondary | ICD-10-CM

## 2016-02-05 DIAGNOSIS — E43 Unspecified severe protein-calorie malnutrition: Secondary | ICD-10-CM | POA: Diagnosis present

## 2016-02-05 DIAGNOSIS — R52 Pain, unspecified: Secondary | ICD-10-CM | POA: Diagnosis not present

## 2016-02-05 DIAGNOSIS — B1920 Unspecified viral hepatitis C without hepatic coma: Secondary | ICD-10-CM | POA: Diagnosis present

## 2016-02-05 DIAGNOSIS — Z8673 Personal history of transient ischemic attack (TIA), and cerebral infarction without residual deficits: Secondary | ICD-10-CM | POA: Diagnosis not present

## 2016-02-05 DIAGNOSIS — R634 Abnormal weight loss: Secondary | ICD-10-CM | POA: Diagnosis not present

## 2016-02-05 DIAGNOSIS — I272 Other secondary pulmonary hypertension: Secondary | ICD-10-CM | POA: Diagnosis present

## 2016-02-05 DIAGNOSIS — I429 Cardiomyopathy, unspecified: Secondary | ICD-10-CM | POA: Diagnosis present

## 2016-02-05 DIAGNOSIS — I110 Hypertensive heart disease with heart failure: Principal | ICD-10-CM | POA: Diagnosis present

## 2016-02-05 DIAGNOSIS — G44029 Chronic cluster headache, not intractable: Secondary | ICD-10-CM | POA: Diagnosis present

## 2016-02-05 DIAGNOSIS — I351 Nonrheumatic aortic (valve) insufficiency: Secondary | ICD-10-CM

## 2016-02-05 DIAGNOSIS — Z79899 Other long term (current) drug therapy: Secondary | ICD-10-CM | POA: Diagnosis not present

## 2016-02-05 DIAGNOSIS — I5023 Acute on chronic systolic (congestive) heart failure: Secondary | ICD-10-CM | POA: Diagnosis present

## 2016-02-05 DIAGNOSIS — I10 Essential (primary) hypertension: Secondary | ICD-10-CM | POA: Diagnosis not present

## 2016-02-05 DIAGNOSIS — E876 Hypokalemia: Secondary | ICD-10-CM | POA: Diagnosis present

## 2016-02-05 DIAGNOSIS — I5043 Acute on chronic combined systolic (congestive) and diastolic (congestive) heart failure: Secondary | ICD-10-CM | POA: Diagnosis present

## 2016-02-05 DIAGNOSIS — B182 Chronic viral hepatitis C: Secondary | ICD-10-CM | POA: Diagnosis present

## 2016-02-05 DIAGNOSIS — I083 Combined rheumatic disorders of mitral, aortic and tricuspid valves: Secondary | ICD-10-CM | POA: Diagnosis present

## 2016-02-05 DIAGNOSIS — I2510 Atherosclerotic heart disease of native coronary artery without angina pectoris: Secondary | ICD-10-CM

## 2016-02-05 DIAGNOSIS — E785 Hyperlipidemia, unspecified: Secondary | ICD-10-CM | POA: Diagnosis present

## 2016-02-05 DIAGNOSIS — I248 Other forms of acute ischemic heart disease: Secondary | ICD-10-CM | POA: Diagnosis present

## 2016-02-05 DIAGNOSIS — I509 Heart failure, unspecified: Secondary | ICD-10-CM | POA: Diagnosis not present

## 2016-02-05 DIAGNOSIS — R0600 Dyspnea, unspecified: Secondary | ICD-10-CM | POA: Diagnosis present

## 2016-02-05 DIAGNOSIS — Z681 Body mass index (BMI) 19 or less, adult: Secondary | ICD-10-CM | POA: Diagnosis not present

## 2016-02-05 DIAGNOSIS — R7989 Other specified abnormal findings of blood chemistry: Secondary | ICD-10-CM | POA: Insufficient documentation

## 2016-02-05 DIAGNOSIS — I5021 Acute systolic (congestive) heart failure: Secondary | ICD-10-CM

## 2016-02-05 DIAGNOSIS — R778 Other specified abnormalities of plasma proteins: Secondary | ICD-10-CM

## 2016-02-05 DIAGNOSIS — Z7982 Long term (current) use of aspirin: Secondary | ICD-10-CM

## 2016-02-05 DIAGNOSIS — I420 Dilated cardiomyopathy: Secondary | ICD-10-CM | POA: Diagnosis not present

## 2016-02-05 DIAGNOSIS — F1210 Cannabis abuse, uncomplicated: Secondary | ICD-10-CM | POA: Diagnosis present

## 2016-02-05 DIAGNOSIS — IMO0002 Reserved for concepts with insufficient information to code with codable children: Secondary | ICD-10-CM

## 2016-02-05 HISTORY — DX: Unspecified protein-calorie malnutrition: E46

## 2016-02-05 HISTORY — DX: Nonrheumatic aortic (valve) insufficiency: I35.1

## 2016-02-05 HISTORY — DX: Chronic systolic (congestive) heart failure: I50.22

## 2016-02-05 HISTORY — DX: Calculus of kidney: N20.0

## 2016-02-05 HISTORY — DX: Rheumatic tricuspid insufficiency: I07.1

## 2016-02-05 HISTORY — DX: Nonrheumatic mitral (valve) insufficiency: I34.0

## 2016-02-05 HISTORY — DX: Cerebral infarction, unspecified: I63.9

## 2016-02-05 HISTORY — DX: Unspecified convulsions: R56.9

## 2016-02-05 LAB — CBC
HCT: 41.2 % (ref 39.0–52.0)
Hemoglobin: 13.9 g/dL (ref 13.0–17.0)
MCH: 29.6 pg (ref 26.0–34.0)
MCHC: 33.7 g/dL (ref 30.0–36.0)
MCV: 87.8 fL (ref 78.0–100.0)
PLATELETS: 190 10*3/uL (ref 150–400)
RBC: 4.69 MIL/uL (ref 4.22–5.81)
RDW: 14.7 % (ref 11.5–15.5)
WBC: 7.3 10*3/uL (ref 4.0–10.5)

## 2016-02-05 LAB — I-STAT TROPONIN, ED: TROPONIN I, POC: 0.09 ng/mL — AB (ref 0.00–0.08)

## 2016-02-05 LAB — BASIC METABOLIC PANEL
Anion gap: 12 (ref 5–15)
BUN: 14 mg/dL (ref 6–20)
CALCIUM: 9.3 mg/dL (ref 8.9–10.3)
CO2: 23 mmol/L (ref 22–32)
CREATININE: 1.04 mg/dL (ref 0.61–1.24)
Chloride: 105 mmol/L (ref 101–111)
GFR calc non Af Amer: >60 mL/min (ref >60)
Glucose, Bld: 106 mg/dL — ABNORMAL HIGH (ref 65–99)
Potassium: 3.3 mmol/L — ABNORMAL LOW (ref 3.5–5.1)
SODIUM: 140 mmol/L (ref 135–145)

## 2016-02-05 LAB — TROPONIN I: Troponin I: 0.09 ng/mL — ABNORMAL HIGH (ref <0.031)

## 2016-02-05 LAB — BRAIN NATRIURETIC PEPTIDE: B Natriuretic Peptide: >4500 pg/mL — ABNORMAL HIGH (ref 0.0–100.0)

## 2016-02-05 LAB — MRSA PCR SCREENING: MRSA BY PCR: NEGATIVE

## 2016-02-05 MED ORDER — HYDRALAZINE HCL 20 MG/ML IJ SOLN
5.0000 mg | Freq: Four times a day (QID) | INTRAMUSCULAR | Status: DC | PRN
  Administered 2016-02-05: 5 mg via INTRAVENOUS
  Filled 2016-02-05 (×2): qty 1

## 2016-02-05 MED ORDER — ATORVASTATIN CALCIUM 10 MG PO TABS
10.0000 mg | ORAL_TABLET | Freq: Every day | ORAL | Status: DC
  Administered 2016-02-06 – 2016-02-11 (×6): 10 mg via ORAL
  Filled 2016-02-05 (×6): qty 1

## 2016-02-05 MED ORDER — HYDRALAZINE HCL 20 MG/ML IJ SOLN: 5.0000 mg | Freq: Four times a day (QID) | INTRAMUSCULAR | Status: DC | PRN

## 2016-02-05 MED ORDER — LISINOPRIL 40 MG PO TABS
40.0000 mg | ORAL_TABLET | Freq: Every day | ORAL | Status: DC
  Administered 2016-02-06 – 2016-02-11 (×6): 40 mg via ORAL
  Filled 2016-02-05 (×2): qty 1
  Filled 2016-02-05: qty 2
  Filled 2016-02-05 (×2): qty 1
  Filled 2016-02-05: qty 2

## 2016-02-05 MED ORDER — SODIUM CHLORIDE 0.9 % IV SOLN: 250.0000 mL | INTRAVENOUS | Status: DC | PRN

## 2016-02-05 MED ORDER — SODIUM CHLORIDE 0.9% FLUSH
3.0000 mL | Freq: Two times a day (BID) | INTRAVENOUS | Status: DC
  Administered 2016-02-05 – 2016-02-09 (×8): 3 mL via INTRAVENOUS

## 2016-02-05 MED ORDER — ENSURE ENLIVE PO LIQD
237.0000 mL | Freq: Three times a day (TID) | ORAL | Status: DC
  Administered 2016-02-05 – 2016-02-11 (×15): 237 mL via ORAL
  Filled 2016-02-05 (×9): qty 237

## 2016-02-05 MED ORDER — FUROSEMIDE 10 MG/ML IJ SOLN
40.0000 mg | Freq: Every day | INTRAMUSCULAR | Status: DC
  Administered 2016-02-06 – 2016-02-09 (×4): 40 mg via INTRAVENOUS
  Filled 2016-02-05 (×4): qty 4

## 2016-02-05 MED ORDER — ASPIRIN 81 MG PO CHEW
324.0000 mg | CHEWABLE_TABLET | Freq: Once | ORAL | Status: AC
  Administered 2016-02-05: 324 mg via ORAL
  Filled 2016-02-05: qty 4

## 2016-02-05 MED ORDER — AMLODIPINE BESYLATE 5 MG PO TABS
5.0000 mg | ORAL_TABLET | Freq: Every day | ORAL | Status: DC
  Administered 2016-02-06 – 2016-02-11 (×6): 5 mg via ORAL
  Filled 2016-02-05 (×6): qty 1

## 2016-02-05 MED ORDER — POTASSIUM CHLORIDE CRYS ER 20 MEQ PO TBCR
40.0000 meq | EXTENDED_RELEASE_TABLET | Freq: Once | ORAL | Status: AC
  Administered 2016-02-05: 40 meq via ORAL
  Filled 2016-02-05: qty 2

## 2016-02-05 MED ORDER — ONDANSETRON HCL 4 MG/2ML IJ SOLN
4.0000 mg | Freq: Four times a day (QID) | INTRAMUSCULAR | Status: DC | PRN
Start: 2016-02-05 — End: 2016-02-11
  Administered 2016-02-10: 04:00:00 4 mg via INTRAVENOUS
  Filled 2016-02-05: qty 2

## 2016-02-05 MED ORDER — HYDRALAZINE HCL 20 MG/ML IJ SOLN
10.0000 mg | Freq: Once | INTRAMUSCULAR | Status: AC
  Administered 2016-02-05: 10 mg via INTRAVENOUS
  Filled 2016-02-05: qty 1

## 2016-02-05 MED ORDER — ASPIRIN EC 325 MG PO TBEC
325.0000 mg | DELAYED_RELEASE_TABLET | Freq: Every day | ORAL | Status: DC
  Administered 2016-02-06 – 2016-02-11 (×6): 325 mg via ORAL
  Filled 2016-02-05 (×6): qty 1

## 2016-02-05 MED ORDER — SODIUM CHLORIDE 0.9% FLUSH: 3.0000 mL | INTRAVENOUS | Status: DC | PRN

## 2016-02-05 MED ORDER — ALBUTEROL SULFATE (2.5 MG/3ML) 0.083% IN NEBU: 2.5000 mg | INHALATION_SOLUTION | RESPIRATORY_TRACT | Status: DC | PRN

## 2016-02-05 MED ORDER — ACETAMINOPHEN 325 MG PO TABS
650.0000 mg | ORAL_TABLET | ORAL | Status: DC | PRN
  Administered 2016-02-06: 650 mg via ORAL
  Filled 2016-02-05: qty 2

## 2016-02-05 MED ORDER — ENOXAPARIN SODIUM 40 MG/0.4ML ~~LOC~~ SOLN
40.0000 mg | SUBCUTANEOUS | Status: DC
  Administered 2016-02-05 – 2016-02-10 (×6): 40 mg via SUBCUTANEOUS
  Filled 2016-02-05 (×5): qty 0.4

## 2016-02-05 MED ORDER — FUROSEMIDE 10 MG/ML IJ SOLN
40.0000 mg | Freq: Once | INTRAMUSCULAR | Status: AC
  Administered 2016-02-05: 40 mg via INTRAVENOUS
  Filled 2016-02-05: qty 4

## 2016-02-05 MED ORDER — LABETALOL HCL 5 MG/ML IV SOLN
10.0000 mg | Freq: Once | INTRAVENOUS | Status: AC
  Administered 2016-02-05: 10 mg via INTRAVENOUS
  Filled 2016-02-05: qty 4

## 2016-02-05 MED ORDER — TRAMADOL HCL 50 MG PO TABS
50.0000 mg | ORAL_TABLET | Freq: Four times a day (QID) | ORAL | Status: DC | PRN
  Administered 2016-02-05 – 2016-02-11 (×12): 50 mg via ORAL
  Filled 2016-02-05 (×12): qty 1

## 2016-02-05 NOTE — ED Provider Notes (Addendum)
CSN: [id number]     Arrival date & time 02/05/16  1216 History   First MD Initiated Contact with Patient 02/05/16 1356     Chief Complaint  Patient presents with  . Shortness of Breath     (Consider location/radiation/quality/duration/timing/severity/associated sxs/prior Treatment) HPI Comments: 65 y.o. male with PMH of hypertension, hepatitis C, CHF who presents with shortness of breath, chest pain. Pt reports that he has been having DIB and L sided chest pain for several months now. The DIB is exertional and now just shaving gets him winded. He also has been having orthopnea and PND like symptoms. Pt has L sided chest pain on occasion, unprovoked, with no specific aggravating or relieving factors. Pt denies any new cough. Pt has no hx of PE, DVT and denies any exogenous hormone use, long distance travels or surgery in the past 6 weeks, active cancer, recent immobilization. Pt had a neg CT PE 4 months ago. Pt also has L sided flank tenderness x 2-3 days. Pain is not pleuritic. No uti like symptoms. Pain radiates to the front.   ROS 10 Systems reviewed and are negative for acute change except as noted in the HPI.     Patient is a 65 y.o. male presenting with shortness of breath. The history is provided by the patient and medical records.  Shortness of Breath   Past Medical History  Diagnosis Date  . Hepatitis C   . Hypertension   . Headache   . TIA (transient ischemic attack)   . CHF (congestive heart failure) [facility])    Past Surgical History  Procedure Laterality Date  . Fracture surgery    . Rotator cuff repair     Family History  Problem Relation Age of Onset  . Breast cancer Mother   . Aneurysm Mother     Brain aneurysm  . Hypertension Father   . Cancer Father     Pancreatic cancer  . Hypertension Brother    Social History  Substance Use Topics  . Smoking status: Never Smoker   . Smokeless tobacco: Never Used  . Alcohol Use: Yes     Comment: occ    Review of  Systems  Respiratory: Positive for shortness of breath.       Allergies  Review of patient's allergies indicates no active allergies.  Home Medications   Prior to Admission medications   Medication Sig Start Date End Date Taking? Authorizing Provider  albuterol (PROVENTIL HFA;VENTOLIN HFA) 108 (90 Base) MCG/ACT inhaler Inhale 1 puff into the lungs every 6 (six) hours as needed for wheezing or shortness of breath.   Yes Historical Provider, MD  aspirin EC 81 MG tablet Take 81 mg by mouth daily.   Yes Historical Provider, MD  atorvastatin (LIPITOR) 10 MG tablet Take 1 tablet (10 mg total) by mouth daily at 6 PM. 10/18/15  Yes Maryann Mikhail, DO  feeding supplement, ENSURE ENLIVE, (ENSURE ENLIVE) LIQD Take 237 mLs by mouth 3 (three) times daily between meals. 10/18/15  Yes Maryann Mikhail, DO  furosemide (LASIX) 20 MG tablet Take 1 tablet (20 mg total) by mouth daily. 10/18/15  Yes Maryann Mikhail, DO  lisinopril (PRINIVIL,ZESTRIL) 40 MG tablet Take 1 tablet (40 mg total) by mouth daily. 10/18/15  Yes Maryann Mikhail, DO  azithromycin (ZITHROMAX) 250 MG tablet Take 1 tablet (250 mg total) by mouth daily. Patient not taking: Reported on 02/05/2016 10/18/15   Velta Addison Mikhail, DO  clopidogrel (PLAVIX) 75 MG tablet Take 1 tablet (75 mg total)  by mouth daily. Patient not taking: Reported on 02/05/2016 10/18/15   Velta Addison Mikhail, DO  dextromethorphan-guaiFENesin [facility] DM) 30-600 MG 12hr tablet Take 1 tablet by mouth 2 (two) times daily. Patient not taking: Reported on 02/05/2016 10/18/15   Cristal Ford, DO  SUMAtriptan (IMITREX) 50 MG tablet May repeat in 2 hours if headache persists or recurs. Do not exceed 2 doses in 24 hours. Patient not taking: Reported on 02/05/2016 10/18/15   Maryann Mikhail, DO   BP 194/70 mmHg  Pulse 99  Temp(Src) 97.7 F (36.5 C) (Oral)  Resp 23  Ht 5\' 7"  (1.702 m)  Wt 111 lb 6.4 oz (50.531 kg)  BMI 17.44 kg/m2  SpO2 96% Physical Exam  Constitutional: He is oriented to  person, place, and time. He appears well-developed.  HENT:  Head: Atraumatic.  Neck: Neck supple.  Cardiovascular: Normal rate and intact distal pulses.   Murmur heard. Pulmonary/Chest: Effort normal.  Neurological: He is alert and oriented to person, place, and time.  Skin: Skin is warm.  Nursing note and vitals reviewed.   ED Course  Procedures (including critical care time) Labs Review Labs Reviewed  BASIC METABOLIC PANEL - Abnormal; Notable for the following:    Potassium 3.3 (*)    Glucose, Bld 106 (*)    All other components within normal limits  TROPONIN I - Abnormal; Notable for the following:    Troponin I 0.09 (*)    All other components within normal limits  BRAIN NATRIURETIC PEPTIDE - Abnormal; Notable for the following:    B Natriuretic Peptide >4500.0 (*)    All other components within normal limits  I-STAT TROPOININ, ED - Abnormal; Notable for the following:    Troponin i, poc 0.09 (*)    All other components within normal limits  CBC    Imaging Review Dg Chest 2 View  02/05/2016  CLINICAL DATA:  Patient with shortness of breath, worsening for multiple months. EXAM: CHEST  2 VIEW COMPARISON:  Chest CT 10/15/2015; chest radiograph 10/15/2015. FINDINGS: Stable cardiac no consolidative pulmonary opacities. No pleural effusion or pneumothorax. Postsurgical changes proximal right humerus. Thoracic spine degenerative changes. IMPRESSION: No acute cardiopulmonary process. Electronically Signed   By: Lovey Newcomer M.D.   On: 02/05/2016 13:13   I have personally reviewed and evaluated these images and lab results as part of my medical decision-making.   EKG Interpretation   Date/Time:  Thursday February 05 2016 12:20:19 EDT Ventricular Rate:  82 PR Interval:  148 QRS Duration: 98 QT Interval:  396 QTC Calculation: 462 R Axis:   -40 Text Interpretation:  Normal sinus rhythm Biatrial enlargement Left axis  deviation Left ventricular hypertrophy Abnormal ECG No acute  changes  Confirmed by Kathrynn Humble, MD, Thelma Comp [phone]) on 02/05/2016 1:58:31 PM      MDM   Final diagnoses:  Acute systolic congestive heart failure (HCC)  Troponin level elevated    Pt comes in with cc of dyspnea. Pt has hx of CHF- and clinically the hx is consistent with acute CHF exacerbation. Pt has some chest discomfort - L sided, atypical and not present currently. Trop is slightly elevated. ASA given. Pt has L flank and lower thoracic pain x 2-3 days. Unsure what to make of it. CT PE and Ct abd from 3 months ago reviewed, and looked reassuring. Cardiac exam is benign, but BNP > 4500 - consistent with likely acute CHF exacerbation. We will start diuresing and admission. PE again considered in the ddx, but with no signs  of DVT, no risk factors for PE/DVT, and CHF appearing to be the most likely case, we will admit as CHF. Medicine team to consider PE if pt is not improving.     Varney Biles, MD 02/05/16 1627  Varney Biles, MD 02/05/16 1651

## 2016-02-05 NOTE — ED Notes (Signed)
Pt states he has been having sob and weight loss over the last 6 months pt states the last month has been getting progressively worse. Pt states on exertion he also gets a sharp pain in his left side. Pt states 1 yr ago was diagnosed with hep C and has no treatment and is ," concerned that is part of my problem."

## 2016-02-05 NOTE — ED Notes (Signed)
Called critical to [facility] and he advised to order troponin.  Called lab to ask if I could add on.  Freda Munro advised just to put order in and they will click it off - do not need to send paperwork.

## 2016-02-05 NOTE — Consult Note (Signed)
Primary cardiologist: New   HPI: 65 year old male with past medical history of recent CVA, hypertension, hepatitis C for evaluation of congestive heart failure. Patient was admitted in February 2017 with complaints of dyspnea.  He was also noted to have weight loss. His BNP was elevated. Chest CT showed question filling defect felt likely artifact. There were small bilateral pleural effusions. He was also found to have a CVA by MRI. Echocardiogram revealed ejection fraction 40- 45%, moderate left ventricular hypertrophy, severe aortic insufficiency, biatrial enlargement, moderate tricuspid regurgitation and severely elevated pulmonary pressures. He was treated with antibiotics, bronchodilators, lisinopril and Lasix. Patient has chronic dyspnea on exertion. However over the last several months this has worsened. He also describes orthopnea and PND.  He denies pedal edema. He has had occasional bouts of chest pain for years not exertional. He does not have exertional chest pain. No syncope. No fevers but he has had subjective chills.  He was seenby primary care and cardiology is now asked to evaluate.   (Not in a hospital admission)  No Active Allergies   Past Medical History  Diagnosis Date  . Hepatitis C   . Hypertension   . Headache   . Chronic systolic CHF (congestive heart failure) ([place])   . Severe aortic regurgitation   . Mitral regurgitation   . Tricuspid regurgitation   . 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 Protein calorie malnutrition ([place])   . Nephrolithiasis   . Seizure [facility]     Past Surgical History  Procedure Laterality Date  . Fracture surgery    . Rotator cuff repair      Social History   Social History  . Marital Status: Married    Spouse Name: N/A  . Number of Children: 3  . Years of Education: N/A   Occupational History  . Not on file.   Social History Main Topics  . Smoking status: Former Research scientist (life sciences)  . Smokeless tobacco: Never Used  . Alcohol Use: 0.0  oz/week    0 Standard drinks or equivalent per week     Comment: occ  . Drug Use: Yes    Special: Marijuana     Comment: THC  . Sexual Activity: Not on file   Other Topics Concern  . Not on file   Social History Narrative    Family History  Problem Relation Age of Onset  . Breast cancer Mother   . Aneurysm Mother     Brain aneurysm  . Hypertension Father   . Cancer Father     Pancreatic cancer  . Hypertension Brother     ROS:  Patient describes occasional cough but no fevers or chills, hemoptysis, dysphasia, odynophagia, melena, hematochezia, dysuria, hematuria, rash, seizure activity, pedal edema, claudication. Remaining systems are negative.  Physical Exam:   Blood pressure 178/69, pulse 86, temperature 97.7 F (36.5 C), temperature source Oral, resp. rate 23, height 5' 7"  (1.702 m), weight 111 lb 6.4 oz (50.531 kg), SpO2 96 %.  General:  Well developed/thin in NAD Skin warm/dry Patient not depressed No peripheral clubbing Back-normal HEENT-normal/normal eyelids Neck supple/normal carotid upstroke bilaterally; no bruits; positive JVD chest - CTA/ normal expansion CV - RRR/normal S1 and S2; no rubs;  3/6 diastolic murmur LSB; increased pulse pressure Abdomen -NT/ND, no HSM, no mass, + bowel sounds, no bruit 2+ femoral pulses, no bruits Ext-no edema, chords, 2+ DP Neuro-grossly nonfocal  ECG Sinus rhythm, biatrial enlargement, left ventricular hypertrophy with repolarization abnormality  Results for orders placed or  performed during the hospital encounter of 02/05/16 (from the past 48 hour(s))  Basic metabolic panel     Status: Abnormal   Collection Time: 02/05/16 12:44 PM  Result Value Ref Range   Sodium 140 135 - 145 mmol/L   Potassium 3.3 (L) 3.5 - 5.1 mmol/L   Chloride 105 101 - 111 mmol/L   CO2 23 22 - 32 mmol/L   Glucose, Bld 106 (H) 65 - 99 mg/dL   BUN 14 6 - 20 mg/dL   Creatinine, Ser 1.04 0.61 - 1.24 mg/dL   Calcium 9.3 8.9 - 10.3 mg/dL   GFR calc  non Af Amer >60 >60 mL/min   GFR calc Af Amer >60 >60 mL/min    Comment: (NOTE) The eGFR has been calculated using the CKD EPI equation. This calculation has not been validated in all clinical situations. eGFR's persistently <60 mL/min signify possible Chronic Kidney Disease.    Anion gap 12 5 - 15  CBC     Status: None   Collection Time: 02/05/16 12:44 PM  Result Value Ref Range   WBC 7.3 4.0 - 10.5 K/uL   RBC 4.69 4.22 - 5.81 MIL/uL   Hemoglobin 13.9 13.0 - 17.0 g/dL   HCT 41.2 39.0 - 52.0 %   MCV 87.8 78.0 - 100.0 fL   MCH 29.6 26.0 - 34.0 pg   MCHC 33.7 30.0 - 36.0 g/dL   RDW 14.7 11.5 - 15.5 %   Platelets 190 150 - 400 K/uL  Troponin I     Status: Abnormal   Collection Time: 02/05/16 12:44 PM  Result Value Ref Range   Troponin I 0.09 (H) <0.031 ng/mL    Comment:        PERSISTENTLY INCREASED TROPONIN VALUES IN THE RANGE OF 0.04-0.49 ng/mL CAN BE SEEN IN:       -UNSTABLE ANGINA       -CONGESTIVE HEART FAILURE       -MYOCARDITIS       -CHEST TRAUMA       -ARRYHTHMIAS       -LATE PRESENTING MYOCARDIAL INFARCTION       -COPD   CLINICAL FOLLOW-UP RECOMMENDED.   I-stat troponin, ED     Status: Abnormal   Collection Time: 02/05/16  1:07 PM  Result Value Ref Range   Troponin i, poc 0.09 (HH) 0.00 - 0.08 ng/mL   Comment NOTIFIED PHYSICIAN    Comment 3            Comment: Due to the release kinetics of cTnI, a negative result within the first hours of the onset of symptoms does not rule out myocardial infarction with certainty. If myocardial infarction is still suspected, repeat the test at appropriate intervals.   Brain natriuretic peptide     Status: Abnormal   Collection Time: 02/05/16  2:21 PM  Result Value Ref Range   B Natriuretic Peptide >4500.0 (H) 0.0 - 100.0 pg/mL    Dg Chest 2 View  02/05/2016  CLINICAL DATA:  Patient with shortness of breath, worsening for multiple months. EXAM: CHEST  2 VIEW COMPARISON:  Chest CT 10/15/2015; chest radiograph  10/15/2015. FINDINGS: Stable cardiac no consolidative pulmonary opacities. No pleural effusion or pneumothorax. Postsurgical changes proximal right humerus. Thoracic spine degenerative changes. IMPRESSION: No acute cardiopulmonary process. Electronically Signed   By: Lovey Newcomer M.D.   On: 02/05/2016 13:13    Assessment/Plan 1 acute systolic congestive heart failure- Patient presents with progressive dyspnea on exertion, orthopnea and PND. There  is no pedal edema.  I have reviewed his previous echocardiogram. His ejection fraction appears to be  35-40% with significant aortic insufficiency. His BNP is greater than 4500. Would repeat echocardiogram.  His systolic blood pressure is 170-180. Adjust medications for blood pressure control which should help with aortic insufficiency. Agree with diuresis and follow renal function. He will likely need right and left catheterization and consideration of aortic valve replacement once he improves. Continue ASA; DC plavix. Note there is no peripheral stigmata of SBE and patient is afebrile. May need TEE to better assess aortic valve morphology. 2 Weight loss-etiology unclear. He states he has lost 35 pounds. He has had recent CT of abdomen and chest without identifiable cause. TSH normal. Question cardiac cachexia. Further workup per primary care. 3 hypertension-poorly controlled. This will exacerbate his aortic insufficiency. Continue ACE inhibitor and diuretics. Add amlodipine 5 mg daily. Increase medications as needed. 4 Hyperlipidemia-continue statin. 5 Recent CVA 6 Hepatitis C  Kirk Ruths MD 02/05/2016, 5:44 PM

## 2016-02-05 NOTE — ED Notes (Signed)
Dr. Kathrynn Humble advised patient may eat and drink at this time

## 2016-02-05 NOTE — H&P (Signed)
History and Physical    Alexis [name] [id number] DOB: 14-Feb-1951 DOA: 02/05/2016   PCP: [name], MD   Patient coming from: Home  Chief Complaint: Worsening dyspnea  HPI: Zuriel Ohr is a 65 y.o. male , married but separated, lives alone, independent of activities of daily living, PMH of HTN, chronic hepatitis C, cluster headache, remote former smoker, marijuana abuse, hospitalized 10/15/15-10/18/15 for acute respiratory failure felt to be secondary to acute bronchitis and acute systolic CHF, LVEF 123XX123 percent, acute CVA presented to [facility] ED on 02/05/16 with progressively worsening dyspnea. Patient apparently has had intermittent dyspnea and chronic atypical left-sided chest pain for years. Stress test >25 years ago in [place] said to be negative. Denies having cardiac cath. Patient states that ever since he discharged from the hospital in February, he has continued to have progressively worsening dyspnea which really got worse in the last 2 weeks. Walking a short distance brings on acute dyspnea. Orthopnea + and patient states that he has to sleep on his belly or 3 pillows and wakes up at about 3 AM in the morning and is unable to sleep secondary to dyspnea. PND +. Denies leg edema. Intermittent mild nonproductive cough. Chronic intermittent left-sided chest pain, 7/10 at its worst, not exertional, aggravated by chest wall movements suggesting musculoskeletal origin. States compliance with Lasix but does not urinate much. Gives history of profound weight loss (160 pounds 6 months ago 211 pounds at this time). Poor appetite. Intermittent loose stools. Denies abdominal pain.  ED Course: Lab work significant for potassium of 3.3, troponin 0.09, BNP >4500, chest x-ray without acute cardiopulmonary process, EKG with LVH. Patient was provided with a dose of IV Lasix with good diuresis and some improvement in dyspnea. No current chest pain. Hospitalist admission was requested. Cardiology has been  consulted.  Review of Systems:  All other systems reviewed and apart from HPI, are negative.  Past Medical History  Diagnosis Date  . Hepatitis C   . Hypertension   . Headache   . Chronic systolic CHF (congestive heart failure) ([place])   . Severe aortic regurgitation   . Mitral regurgitation   . Tricuspid regurgitation   . 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 Protein calorie malnutrition ([place])   . Nephrolithiasis   . Seizure [facility])     Past Surgical History  Procedure Laterality Date  . Fracture surgery    . Rotator cuff repair     Social history  reports that he has quit smoking. He has never used smokeless tobacco. He reports that he drinks alcohol. He reports that he uses illicit drugs (Marijuana).  No Active Allergies  Family History  Problem Relation Age of Onset  . Breast cancer Mother   . Aneurysm Mother     Brain aneurysm  . Hypertension Father   . Cancer Father     Pancreatic cancer  . Hypertension Brother      Prior to Admission medications   Medication Sig Start Date End Date Taking? Authorizing Provider  albuterol (PROVENTIL HFA;VENTOLIN HFA) 108 (90 Base) MCG/ACT inhaler Inhale 1 puff into the lungs every 6 (six) hours as needed for wheezing or shortness of breath.   Yes Historical Provider, MD  aspirin EC 81 MG tablet Take 81 mg by mouth daily.   Yes Historical Provider, MD  atorvastatin (LIPITOR) 10 MG tablet Take 1 tablet (10 mg total) by mouth daily at 6 PM. 10/18/15  Yes Maryann Mikhail, DO  feeding supplement, ENSURE ENLIVE, (ENSURE ENLIVE) LIQD  Take 237 mLs by mouth 3 (three) times daily between meals. 10/18/15  Yes Maryann Mikhail, DO  furosemide (LASIX) 20 MG tablet Take 1 tablet (20 mg total) by mouth daily. 10/18/15  Yes Maryann Mikhail, DO  lisinopril (PRINIVIL,ZESTRIL) 40 MG tablet Take 1 tablet (40 mg total) by mouth daily. 10/18/15  Yes Cristal Ford, DO    Physical Exam: Filed Vitals:   02/05/16 1500 02/05/16 1530 02/05/16 1600 02/05/16 1700    BP: 179/71 198/99 194/70 178/69  Pulse: 79 108 99 86  Temp:      TempSrc:      Resp: 22 24 23    Height:      Weight:      SpO2: 99% 96% 96% 96%   Temperature: 97.49F.   Constitutional: Small built, frail, chronically ill-looking middle-aged male sitting up at age of gurney in the ED with mild SOB which seems to get worse with minimal exertion. Eyes: PERTLA, lids and conjunctivae normal ENMT: Mucous membranes are moist. Posterior pharynx clear of any exudate or lesions. Normal dentition.  Neck: normal, supple, no masses, no thyromegaly. Solitary bilateral submandibular lymph nodes +. Respiratory: Distant breath sounds but clear to auscultation except occasional basal crackles. Mild increased work of breathing . No accessory muscle use.  Cardiovascular: S1 & S2 heard, regular rate and rhythm, no rubs / gallops. No extremity edema. 2+ pedal pulses. No carotid bruits. JVD +. Telemetry: Sinus rhythm in the 90s. Systolic ejection murmur at the apex and? Early diastolic murmur at left lower sternal edge. Abdomen: No distension, no tenderness, no masses palpated. No hepatosplenomegaly. Bowel sounds normal.  Musculoskeletal: no clubbing / cyanosis. No joint deformity upper and lower extremities. Good ROM, no contractures. Normal muscle tone.  Skin: no rashes, lesions, ulcers. No induration Neurologic: CN 2-12 grossly intact. Sensation intact, DTR normal. Strength 5/5 in all 4 limbs.  Psychiatric: Normal judgment and insight. Alert and oriented x 3. Normal mood.     Labs on Admission: I have personally reviewed following labs and imaging studies  CBC:  Recent Labs Lab 02/05/16 1244  WBC 7.3  HGB 13.9  HCT 41.2  MCV 87.8  PLT 99991111   Basic Metabolic Panel:  Recent Labs Lab 02/05/16 1244  NA 140  K 3.3*  CL 105  CO2 23  GLUCOSE 106*  BUN 14  CREATININE 1.04  CALCIUM 9.3   GFR: Estimated Creatinine Clearance: 50.6 mL/min (by C-G formula based on Cr of 1.04). Liver Function  Tests: No results for input(s): AST, ALT, ALKPHOS, BILITOT, PROT, ALBUMIN in the last 168 hours. No results for input(s): LIPASE, AMYLASE in the last 168 hours. No results for input(s): AMMONIA in the last 168 hours. Coagulation Profile: No results for input(s): INR, PROTIME in the last 168 hours. Cardiac Enzymes:  Recent Labs Lab 02/05/16 1244  TROPONINI 0.09*   BNP (last 3 results) No results for input(s): PROBNP in the last 8760 hours. HbA1C: No results for input(s): HGBA1C in the last 72 hours. CBG: No results for input(s): GLUCAP in the last 168 hours. Lipid Profile: No results for input(s): CHOL, HDL, LDLCALC, TRIG, CHOLHDL, LDLDIRECT in the last 72 hours. Thyroid Function Tests: No results for input(s): TSH, T4TOTAL, FREET4, T3FREE, THYROIDAB in the last 72 hours. Anemia Panel: No results for input(s): VITAMINB12, FOLATE, FERRITIN, TIBC, IRON, RETICCTPCT in the last 72 hours.   Radiological Exams on Admission: Dg Chest 2 View  02/05/2016  CLINICAL DATA:  Patient with shortness of breath, worsening for multiple months.  EXAM: CHEST  2 VIEW COMPARISON:  Chest CT 10/15/2015; chest radiograph 10/15/2015. FINDINGS: Stable cardiac no consolidative pulmonary opacities. No pleural effusion or pneumothorax. Postsurgical changes proximal right humerus. Thoracic spine degenerative changes. IMPRESSION: No acute cardiopulmonary process. Electronically Signed   By: Lovey Newcomer M.D.   On: 02/05/2016 13:13    EKG: Independently reviewed. Sinus rhythm, biatrial enlargement, LVH with repolarization abnormalities. QTC 480 ms.  Assessment/Plan Principal Problem:   Acute on chronic systolic CHF (congestive heart failure) (HCC) Active Problems:   Chronic hepatitis C without hepatic coma (HCC)   Chronic cluster headache, not intractable   Loss of weight   Essential hypertension   Protein-calorie malnutrition, severe   Pulmonary hypertension (HCC)   History of CVA (cerebrovascular  accident)   Hypokalemia   Marijuana abuse   Systolic CHF, acute on chronic (HCC)    Acute on chronic systolic CHF/cardiomyopathy - Patient has progressive worsening symptoms including DOE, orthopnea and PND. Chronic atypical nonexertional chest pain. - Although no significant peripheral edema, typical symptomatology and markedly elevated BNP suspicious for decompensated CHF. Has improved somewhat with IV Lasix. - Continue IV Lasix 40 mg daily. - 2-D echo 10/16/15: Showed several abnormalities including LVEF 40-45 percent, diffuse LV hypokinesis, severe AR, moderate MR, moderate TR and severe pulmonary artery hypertension. Repeat 2-D echocardiogram. - Due to complex nature of his cardiac condition, cardiology consulted for assistance with evaluation and management.  Severe pulmonary artery hypertension - Unclear etiology. - Low index of suspicion for acute PE. CTA chest 10/15/15: Questionable filling defect in left lower lobe pulmonary artery favored artifact or sequelae of remote pulmonary embolism and did not have typical appearance of acute PE. The remaining pulmonary arteries were normal. Also raised question of pulmonary edema. - If his clinical status does not improve with treating for CHF, may consider further evaluation i.e. repeating CTA chest.  Severe AR, moderate MR & TR - Cardiology consultation. At some point may need right and left heart catheterization to further evaluate. Treat hypertension to minimize AR.  Elevated troponin - Likely due to demand ischemia from decompensated CHF and respiratory distress. Trend troponin. Repeat 2-D echo.  Essential hypertension, uncontrolled - Has not taken his antihypertensives today. Continue lisinopril 40 MG daily. Cardiology adding amlodipine. Also on IV Lasix.  Hyperlipidemia - Continue Lipitor  Recent CVA - No residual deficits. Patient voluntarily discontinued Plavix and remains on aspirin-continue.  Chronic hepatitis C -Not on  treatment at this time. Outpatient follow[facility]up at the [facility] regarding treatment.  Severe protein calorie malnutrition/profound weight loss - Cardiac cachexia may be a contributor. Recent CT chest, abdomen and pelvis without concerning etiology. TSH: 1.828. A1c 5.3. - Consider outpatient screening colonoscopy if not already performed.  - Dietitian consultation.  Intermittent diarrhea - Patient not able to provide consistent history. Need to discuss this with him further. Consideration for screening colonoscopy as outpatient.  Marijuana abuse - Cessation counseled.  Hypokalemia - Replace and follow.  Cluster headaches - Has not complained of headaches today.   DVT prophylaxis: Lovenox  Code Status: Full  Family Communication: Discussed at length with patient. No family at bedside.  Disposition Plan: DC home when medically stable.  Consults called: Cardiology  Admission status: Inpatient stepdown.    [facility] MD [facility] Pager [phone]  If 7PM-7AM, please contact night-coverage [email or web address] Password [email or web address]  02/05/2016, 5:52 PM

## 2016-02-05 NOTE — ED Notes (Signed)
MD at bedside. 

## 2016-02-06 ENCOUNTER — Inpatient Hospital Stay (HOSPITAL_COMMUNITY): Payer: Medicare HMO

## 2016-02-06 DIAGNOSIS — I509 Heart failure, unspecified: Secondary | ICD-10-CM

## 2016-02-06 DIAGNOSIS — G44029 Chronic cluster headache, not intractable: Secondary | ICD-10-CM

## 2016-02-06 DIAGNOSIS — R778 Other specified abnormalities of plasma proteins: Secondary | ICD-10-CM | POA: Insufficient documentation

## 2016-02-06 DIAGNOSIS — R7989 Other specified abnormal findings of blood chemistry: Secondary | ICD-10-CM

## 2016-02-06 LAB — BASIC METABOLIC PANEL
Anion gap: 9 (ref 5–15)
BUN: 16 mg/dL (ref 6–20)
CHLORIDE: 104 mmol/L (ref 101–111)
CO2: 27 mmol/L (ref 22–32)
CREATININE: 1 mg/dL (ref 0.61–1.24)
Calcium: 9.6 mg/dL (ref 8.9–10.3)
GFR calc Af Amer: >60 mL/min (ref >60)
GFR calc non Af Amer: >60 mL/min (ref >60)
GLUCOSE: 108 mg/dL — AB (ref 65–99)
Potassium: 3.4 mmol/L — ABNORMAL LOW (ref 3.5–5.1)
SODIUM: 140 mmol/L (ref 135–145)

## 2016-02-06 LAB — TROPONIN I
TROPONIN I: 0.14 ng/mL — AB (ref <0.031)
Troponin I: 0.12 ng/mL — ABNORMAL HIGH (ref <0.031)
Troponin I: 0.09 ng/mL — ABNORMAL HIGH (ref <0.031)

## 2016-02-06 LAB — ECHOCARDIOGRAM COMPLETE
HEIGHTINCHES: 64 in
WEIGHTICAEL: 1708.8 [oz_av]

## 2016-02-06 MED ORDER — POTASSIUM CHLORIDE CRYS ER 20 MEQ PO TBCR
40.0000 meq | EXTENDED_RELEASE_TABLET | Freq: Once | ORAL | Status: AC
  Administered 2016-02-06: 40 meq via ORAL
  Filled 2016-02-06: qty 2

## 2016-02-06 MED ORDER — CARVEDILOL 3.125 MG PO TABS
3.1250 mg | ORAL_TABLET | Freq: Two times a day (BID) | ORAL | Status: DC
  Administered 2016-02-06 – 2016-02-10 (×9): 3.125 mg via ORAL
  Filled 2016-02-06 (×10): qty 1

## 2016-02-06 NOTE — Progress Notes (Signed)
PROGRESS NOTE  Alexis [name]  [id number] DOB: Nov 25, 1950  DOA: 02/05/2016 PCP: [name], MD   Brief Narrative:  65 y.o. male , married but separated, lives alone, independent of activities of daily living, PMH of HTN, chronic hepatitis C, cluster headache, remote former smoker, marijuana abuse, hospitalized 10/15/15-10/18/15 for acute respiratory failure felt to be secondary to acute bronchitis and acute systolic CHF, LVEF 123XX123 percent, acute CVA presented to [facility] ED on 02/05/16 with progressively worsening dyspnea. In ED, troponin 0.09, BNP >4500, chest x-ray without acute cardiopulmonary process, EKG with LVH.mitted to SDU for Acute on chronic Systolic CHF and severe AI. Cardiology consulted, improving, plan R&L Heart Cath on 6/5 and considering TEE & AVR.  Assessment & Plan:   Principal Problem:   Acute on chronic systolic CHF (congestive heart failure) (HCC) Active Problems:   Chronic hepatitis C without hepatic coma (HCC)   Chronic cluster headache, not intractable   Loss of weight   Essential hypertension   Protein-calorie malnutrition, severe   Pulmonary hypertension (HCC)   History of CVA (cerebrovascular accident)   Hypokalemia   Marijuana abuse   Systolic CHF, acute on chronic (HCC)   Acute on chronic systolic CHF/cardiomyopathy - Patient has progressive worsening symptoms including DOE, orthopnea and PND. Chronic atypical nonexertional chest pain. - Although no significant peripheral edema, typical symptomatology and markedly elevated BNP suspicious for decompensated CHF. Has improved somewhat with IV Lasix. - Continue IV Lasix 40 mg daily. - 2-D echo 10/16/15: Showed several abnormalities including LVEF 40-45 percent, diffuse LV hypokinesis, severe AR, moderate MR, moderate TR and severe pulmonary artery hypertension. Repeat 2-D echocardiogram. - Per patient's report, he diuresed well overnight. Intake and output not adequately charted. Clinically better.  - Cardiology  consultation and follow-up appreciated >plan left and right heart cath on 6/5 followed by consideration for TEE and aortic valve replacement.  Severe pulmonary artery hypertension - Unclear etiology. - Low index of suspicion for acute PE. CTA chest 10/15/15: Questionable filling defect in left lower lobe pulmonary artery favored artifact or sequelae of remote pulmonary embolism and did not have typical appearance of acute PE. The remaining pulmonary arteries were normal. Also raised question of pulmonary edema. - RHC on 6/5  Severe AR, moderate MR & TR - Cardiology follow-up appreciated. Indicating possible AVR consideration after cardiac on 6/5.  Elevated troponin - Likely due to demand ischemia from decompensated CHF and respiratory distress. Mildly elevated troponin but mostly a flat trend. Follow repeat 2-D echo.  Essential hypertension, uncontrolled - Continue lisinopril 40 MG daily. Amlodipine newly started. Carvedilol added today. Monitor  Hyperlipidemia - Continue Lipitor  Recent CVA - No residual deficits. Patient voluntarily discontinued Plavix and remains on aspirin-continue. as per RN,? Strokelike symptoms overnight and respiratory response evaluated (no notes in chart) however no stroke findings and symptoms felt to be due to dyspnea. No symptoms or signs of CVA this morning.   Chronic hepatitis C -Not on treatment at this time. Outpatient follow-up at the [facility] regarding treatment.  Severe protein calorie malnutrition/profound weight loss - Cardiac cachexia may be a contributor. Recent CT chest, abdomen and pelvis without concerning etiology. TSH: 1.828. A1c 5.3. - Consider outpatient screening colonoscopy if not already performed.  - Dietitian consultation.  Intermittent diarrhea - Patient not able to provide consistent history. Need to discuss this with him further. Consideration for screening colonoscopy as outpatient.  Marijuana abuse - Cessation  counseled.  Hypokalemia - Replace and follow. No lab seen for this morning.  Cluster headaches - Symptomatic Rx.  DVT prophylaxis: Lovenox Code Status: Full Family Communication: Discussed with patient. No family at bedside.  Disposition Plan: admitted to stepdown unit-continue treatment in SDU for today.    Consultants:   Cardiology  Procedures:   None  Antimicrobials:   None    Subjective: Feels better. No pain reported. Dyspnea improved. States that he urinated well overnight after Lasix. As per RN, RRT evaluated overnight for strokelike symptoms.   Objective:  Filed Vitals:   02/06/16 0759 02/06/16 0800 02/06/16 0900 02/06/16 1000  BP: 157/46 157/46 143/49 143/67  Pulse: 75 68 63 76  Temp: 97.8 F (36.6 C)     TempSrc: Oral     Resp: 20 18 14 15   Height:      Weight:      SpO2: 97% 99% 98% 98%    Intake/Output Summary (Last 24 hours) at 02/06/16 1024 Last data filed at 02/06/16 0748  Gross per 24 hour  Intake    720 ml  Output    275 ml  Net    445 ml   Filed Weights   02/05/16 1228 02/05/16 2000 02/06/16 0352  Weight: 50.531 kg (111 lb 6.4 oz) 50.7 kg (111 lb 12.4 oz) 48.444 kg (106 lb 12.8 oz)    Examination:  General exam: Small built and frail chronically ill-looking middle-age male lying comfortably propped up in bed. Respiratory system: Clear to auscultation. Respiratory effort normal. Cardiovascular system: S1 & S2 heard, RRR. No JVD, rubs, gallops or clicks. No pedal edema. Systolic murmur 2 x 6 at apex and early diastolic murmur at left lower sternal age. Telemetry: Sinus rhythm.  Gastrointestinal system: Abdomen is nondistended, soft and nontender. No organomegaly or masses felt. Normal bowel sounds heard. Central nervous system: Alert and oriented. No focal neurological deficits. Extremities: Symmetric 5 x 5 power. Skin: No rashes, lesions or ulcers Psychiatry: Judgement and insight appear normal. Mood & affect appropriate.      Data Reviewed: I have personally reviewed following labs and imaging studies  CBC:  Recent Labs Lab 02/05/16 1244  WBC 7.3  HGB 13.9  HCT 41.2  MCV 87.8  PLT 99991111   Basic Metabolic Panel:  Recent Labs Lab 02/05/16 1244  NA 140  K 3.3*  CL 105  CO2 23  GLUCOSE 106*  BUN 14  CREATININE 1.04  CALCIUM 9.3   GFR: Estimated Creatinine Clearance: 48.5 mL/min (by C-G formula based on Cr of 1.04). Liver Function Tests: No results for input(s): AST, ALT, ALKPHOS, BILITOT, PROT, ALBUMIN in the last 168 hours. No results for input(s): LIPASE, AMYLASE in the last 168 hours. No results for input(s): AMMONIA in the last 168 hours. Coagulation Profile: No results for input(s): INR, PROTIME in the last 168 hours. Cardiac Enzymes:  Recent Labs Lab 02/05/16 1244 02/06/16 0215 02/06/16 0824  TROPONINI 0.09* 0.14* 0.12*   BNP (last 3 results) No results for input(s): PROBNP in the last 8760 hours. HbA1C: No results for input(s): HGBA1C in the last 72 hours. CBG: No results for input(s): GLUCAP in the last 168 hours. Lipid Profile: No results for input(s): CHOL, HDL, LDLCALC, TRIG, CHOLHDL, LDLDIRECT in the last 72 hours. Thyroid Function Tests: No results for input(s): TSH, T4TOTAL, FREET4, T3FREE, THYROIDAB in the last 72 hours. Anemia Panel: No results for input(s): VITAMINB12, FOLATE, FERRITIN, TIBC, IRON, RETICCTPCT in the last 72 hours.  Sepsis Labs: No results for input(s): PROCALCITON, LATICACIDVEN in the last 168 hours.  Recent Results (from  the past 240 hour(s))  MRSA PCR Screening     Status: None   Collection Time: 02/05/16  6:41 PM  Result Value Ref Range Status   MRSA by PCR NEGATIVE NEGATIVE Final    Comment:        The GeneXpert MRSA Assay (FDA approved for NASAL specimens only), is one component of a comprehensive MRSA colonization surveillance program. It is not intended to diagnose MRSA infection nor to guide or monitor treatment for MRSA  infections.          Radiology Studies: Dg Chest 2 View  02/05/2016  CLINICAL DATA:  Patient with shortness of breath, worsening for multiple months. EXAM: CHEST  2 VIEW COMPARISON:  Chest CT 10/15/2015; chest radiograph 10/15/2015. FINDINGS: Stable cardiac no consolidative pulmonary opacities. No pleural effusion or pneumothorax. Postsurgical changes proximal right humerus. Thoracic spine degenerative changes. IMPRESSION: No acute cardiopulmonary process. Electronically Signed   By: Lovey Newcomer M.D.   On: 02/05/2016 13:13        Scheduled Meds: . amLODipine  5 mg Oral Daily  . aspirin EC  325 mg Oral Daily  . atorvastatin  10 mg Oral q1800  . carvedilol  3.125 mg Oral BID WC  . enoxaparin (LOVENOX) injection  40 mg Subcutaneous Q24H  . feeding supplement (ENSURE ENLIVE)  237 mL Oral TID BM  . furosemide  40 mg Intravenous Daily  . lisinopril  40 mg Oral Daily  . sodium chloride flush  3 mL Intravenous Q12H   Continuous Infusions:    LOS: 1 day    Time spent: 30 minutes.    [facility], MD [facility] Pager [phone]  If 7PM-7AM, please contact night-coverage [email or web address] Password [facility] 02/06/2016, 10:24 AM

## 2016-02-06 NOTE — Progress Notes (Signed)
    Subjective:  Denies CP; dyspnea improving   Objective:  Filed Vitals:   02/06/16 0500 02/06/16 0600 02/06/16 0700 02/06/16 0759  BP: 130/55 140/54 151/56 157/46  Pulse: 63 68 73 75  Temp:    97.8 F (36.6 C)  TempSrc:    Oral  Resp: 12 23 19 20   Height:      Weight:      SpO2: 97% 97% 98% 97%    Intake/Output from previous day:  Intake/Output Summary (Last 24 hours) at 02/06/16 0825 Last data filed at 02/06/16 0748  Gross per 24 hour  Intake    720 ml  Output    275 ml  Net    445 ml    Physical Exam: Physical exam: Well-developed thin in no acute distress.  Skin is warm and dry.  HEENT is normal.  Neck is supple. Chest is clear to auscultation with normal expansion.  Cardiovascular exam is regular rate and rhythm. 3/6 diastolic murmur LSB Abdominal exam nontender or distended. No masses palpated. Extremities show no edema. neuro grossly intact    Lab Results: Basic Metabolic Panel:  Recent Labs  02/05/16 1244  NA 140  K 3.3*  CL 105  CO2 23  GLUCOSE 106*  BUN 14  CREATININE 1.04  CALCIUM 9.3   CBC:  Recent Labs  02/05/16 1244  WBC 7.3  HGB 13.9  HCT 41.2  MCV 87.8  PLT 190   Cardiac Enzymes:  Recent Labs  02/05/16 1244 02/06/16 0215  TROPONINI 0.09* 0.14*     Assessment/Plan:  1 acute systolic congestive heart failure/AI- Patient presents with progressive dyspnea on exertion, orthopnea and PND. There is no pedal edema. I have reviewed his previous echocardiogram. His ejection fraction appears to be 35-40% with severe aortic insufficiency. Will repeat echocardiogram.Adjust medications for blood pressure control which should help with aortic insufficiency. Continue gentle diuresis and follow renal function. Plan right and left catheterization on Monday followed by consideration of aortic valve replacement once he improves. Continue ASA; plavix DCed. May need TEE to better assess aortic valve morphology. 2 Weight loss-etiology  unclear. He states he has lost 35 pounds. He has had recent CT of abdomen and chest without identifiable cause. TSH normal. Question cardiac cachexia. Further workup per primary care. 3 hypertension-BP remains elevated. This will exacerbate his aortic insufficiency. Continue ACE inhibitor, amlodipine and diuretics. Add coreg and increase as tolerated. 4 Hyperlipidemia-continue statin. 5 Recent CVA 6 Hepatitis C  Kirk Ruths 02/06/2016, 8:25 AM

## 2016-02-06 NOTE — Progress Notes (Addendum)
Initial Nutrition Assessment  DOCUMENTATION CODES:   Underweight, Severe malnutrition in context of chronic illness  INTERVENTION:   -Continue Ensure Enlive po TID, each supplement provides 350 kcal and 20 grams of protein  NUTRITION DIAGNOSIS:   Malnutrition related to chronic illness as evidenced by severe depletion of body fat, severe depletion of muscle mass.  GOAL:   Patient will meet greater than or equal to 90% of their needs  MONITOR:   PO intake, Supplement acceptance, Labs, Weight trends, Skin, I & O's  REASON FOR ASSESSMENT:   Malnutrition Screening Tool    ASSESSMENT:   Franchot Naso is a 65 y.o. male , married but separated, lives alone, independent of activities of daily living, PMH of HTN, chronic hepatitis C, cluster headache, remote former smoker, marijuana abuse, hospitalized 10/15/15-10/18/15 for acute respiratory failure felt to be secondary to acute bronchitis and acute systolic CHF, LVEF 123XX123 percent, acute CVA presented to [facility] ED on 02/05/16 with progressively worsening dyspnea.   Pt admitted with CHF.   Hx obtained from pt and partner, Laurey Arrow, at bedside. Pt reports a general decline in health since January 2017. He reports prior to this he was in his usual state of health and was maintaining wt of around 160#. He estimates he has experienced ongoing weight loss over the past 3-4 months, estimating a 50# weight loss over this time period. However, this is not consistent with wt hx. Noted a 7% wt loss over the past 3 months.   Pt reports his appetite is fair, but "eats all the time and is constantly snacking" at home (Breakfast: cereal or grits, eggs, and toast, lunch: sandwich, dinner: meat, starch, and vegetable, snacks include cookies and chips). Pt reports he consumes Ensure when he can, but often cannot afford it. He has been consuming Ensure during hospitalization and would like to continue during hospital stay.   Nutrition-Focused physical exam completed.  Findings are severe fat depletion, severe muscle depletion, and no edema.   Pt expresses deep concern over current nutritional status. Dicussed importance of high protein diet to prevent further muscle loss. Educated pt on ways he could incorporate more protein in his diet, including consuming ice cream or yogurt for snacks, adding peanut butter to crackers, and preparing hot cereals with milk. Praised pt for consuming small, frequent meals to optimize intake. Pt expressed appreciation for visit.   Case discussed with RN.   Labs reviewed.   Diet Order:  Diet Heart Room service appropriate?: Yes; Fluid consistency:: Thin  Skin:  Reviewed, no issues  Last BM:  02/05/16  Height:   Ht Readings from Last 1 Encounters:  02/05/16 5\' 4"  (1.626 m)    Weight:   Wt Readings from Last 1 Encounters:  02/06/16 106 lb 12.8 oz (48.444 kg)    Ideal Body Weight:  59.1 kg  BMI:  Body mass index is 18.32 kg/(m^2).  Estimated Nutritional Needs:   Kcal:  1500-1700  Protein:  75-90 grams  Fluid:  1.5-1.7 L  EDUCATION NEEDS:   Education needs addressed  Kawehi Hostetter A. Jimmye Norman, RD, LDN, CDE Pager: [phone] After hours Pager: [phone]

## 2016-02-07 ENCOUNTER — Other Ambulatory Visit (HOSPITAL_COMMUNITY): Payer: Medicare Other

## 2016-02-07 LAB — BASIC METABOLIC PANEL
ANION GAP: 11 (ref 5–15)
BUN: 23 mg/dL — ABNORMAL HIGH (ref 6–20)
CALCIUM: 10.2 mg/dL (ref 8.9–10.3)
CO2: 33 mmol/L — ABNORMAL HIGH (ref 22–32)
CREATININE: 0.91 mg/dL (ref 0.61–1.24)
Chloride: 98 mmol/L — ABNORMAL LOW (ref 101–111)
Glucose, Bld: 99 mg/dL (ref 65–99)
Potassium: 3.8 mmol/L (ref 3.5–5.1)
Sodium: 142 mmol/L (ref 135–145)

## 2016-02-07 NOTE — Progress Notes (Signed)
Patient ID: Alexis [name], male   DOB: 05-22-1951, 65 y.o.   MRN: [id number]    Patient Name: Alexis [name] Date of Encounter: 02/07/2016     Principal Problem:   Acute on chronic systolic CHF (congestive heart failure) ([place]) Active Problems:   Chronic hepatitis C without hepatic coma (HCC)   Chronic cluster headache, not intractable   Loss of weight   Essential hypertension   Protein-calorie malnutrition, severe   Pulmonary hypertension (HCC)   History of CVA (cerebrovascular accident)   Hypokalemia   Marijuana abuse   Systolic CHF, acute on chronic (HCC)   Troponin level elevated    SUBJECTIVE  No chest pain or sob.   CURRENT MEDS . amLODipine  5 mg Oral Daily  . aspirin EC  325 mg Oral Daily  . atorvastatin  10 mg Oral q1800  . carvedilol  3.125 mg Oral BID WC  . enoxaparin (LOVENOX) injection  40 mg Subcutaneous Q24H  . feeding supplement (ENSURE ENLIVE)  237 mL Oral TID BM  . furosemide  40 mg Intravenous Daily  . lisinopril  40 mg Oral Daily  . sodium chloride flush  3 mL Intravenous Q12H    OBJECTIVE  Filed Vitals:   02/07/16 0500 02/07/16 0600 02/07/16 0700 02/07/16 0816  BP: 131/50 139/42 129/80 157/63  Pulse: 62 68 66 84  Temp:    97.9 F (36.6 C)  TempSrc:    Oral  Resp: 21 26 16 22   Height:      Weight:      SpO2: 95% 94% 97% 96%    Intake/Output Summary (Last 24 hours) at 02/07/16 1110 Last data filed at 02/07/16 1059  Gross per 24 hour  Intake    967 ml  Output   1150 ml  Net   -183 ml   Filed Weights   02/05/16 2000 02/06/16 0352 02/07/16 0400  Weight: 111 lb 12.4 oz (50.7 kg) 106 lb 12.8 oz (48.444 kg) 109 lb 6.4 oz (49.624 kg)    PHYSICAL EXAM  General: Pleasant, NAD. Neuro: Alert and oriented X 3. Moves all extremities spontaneously. Psych: Normal affect. Anxious. HEENT:  Normal  Neck: Supple without bruits, 10cm  JVD. Lungs:  Resp regular and unlabored, CTA. Heart: RRR no s3, s4, 2/6 AI murmur. Abdomen: Soft, non-tender,  non-distended, BS + x 4.  Extremities: No clubbing, cyanosis or edema. DP/PT/Radials 2+ and equal bilaterally.  Accessory Clinical Findings  CBC  Recent Labs  02/05/16 1244  WBC 7.3  HGB 13.9  HCT 41.2  MCV 87.8  PLT 99991111   Basic Metabolic Panel  Recent Labs  02/06/16 1115 02/07/16 0206  NA 140 142  K 3.4* 3.8  CL 104 98*  CO2 27 33*  GLUCOSE 108* 99  BUN 16 23*  CREATININE 1.00 0.91  CALCIUM 9.6 10.2   Liver Function Tests No results for input(s): AST, ALT, ALKPHOS, BILITOT, PROT, ALBUMIN in the last 72 hours. No results for input(s): LIPASE, AMYLASE in the last 72 hours. Cardiac Enzymes  Recent Labs  02/06/16 0215 02/06/16 0824 02/06/16 1433  TROPONINI 0.14* 0.12* 0.09*   BNP Invalid input(s): POCBNP D-Dimer No results for input(s): DDIMER in the last 72 hours. Hemoglobin A1C No results for input(s): HGBA1C in the last 72 hours. Fasting Lipid Panel No results for input(s): CHOL, HDL, LDLCALC, TRIG, CHOLHDL, LDLDIRECT in the last 72 hours. Thyroid Function Tests No results for input(s): TSH, T4TOTAL, T3FREE, THYROIDAB in the last 72 hours.  Invalid input(s): FREET3  TELE  nsr with PVC's  Radiology/Studies  Dg Chest 2 View  02/05/2016  CLINICAL DATA:  Patient with shortness of breath, worsening for multiple months. EXAM: CHEST  2 VIEW COMPARISON:  Chest CT 10/15/2015; chest radiograph 10/15/2015. FINDINGS: Stable cardiac no consolidative pulmonary opacities. No pleural effusion or pneumothorax. Postsurgical changes proximal right humerus. Thoracic spine degenerative changes. IMPRESSION: No acute cardiopulmonary process. Electronically Signed   By: Lovey Newcomer M.D.   On: 02/05/2016 13:13    ASSESSMENT AND PLAN  1. Acute on chronic systolic and diastolic heart failure - he is improved with diuresis. Continue 2. AI - appears to be severe. He will undergo right and left heart cath on Monday.  3. Pulmonary HTN - likely a consequence of AI. Await heart  cath. 4. Weight loss - etiology unclear.   Alexis [name],M.D.  02/07/2016 11:10 AM

## 2016-02-07 NOTE — Progress Notes (Signed)
PROGRESS NOTE  Alexis [name]  [id number] DOB: 12-Aug-1951  DOA: 02/05/2016 PCP: [name], MD   Brief Narrative:  65 y.o. male , married but separated, lives alone, independent of activities of daily living, PMH of HTN, chronic hepatitis C, cluster headache, remote former smoker, marijuana abuse, hospitalized 10/15/15-10/18/15 for acute respiratory failure felt to be secondary to acute bronchitis and acute systolic CHF, LVEF 123XX123 percent, acute CVA presented to [facility] ED on 02/05/16 with progressively worsening dyspnea. In ED, troponin 0.09, BNP >4500, chest x-ray without acute cardiopulmonary process, EKG with LVH.mitted to SDU for Acute on chronic Systolic CHF and severe AI. Cardiology consulted, improving, plan R&L Heart Cath on 6/5 and considering TEE & AVR.  Assessment & Plan:   Principal Problem:   Acute on chronic systolic CHF (congestive heart failure) (HCC) Active Problems:   Chronic hepatitis C without hepatic coma (HCC)   Chronic cluster headache, not intractable   Loss of weight   Essential hypertension   Protein-calorie malnutrition, severe   Pulmonary hypertension (HCC)   History of CVA (cerebrovascular accident)   Hypokalemia   Marijuana abuse   Systolic CHF, acute on chronic (HCC)   Troponin level elevated   Acute on chronic systolic CHF/cardiomyopathy - Patient has progressive worsening symptoms including DOE, orthopnea and PND. Chronic atypical nonexertional chest pain. - Although no significant peripheral edema, typical symptomatology and markedly elevated BNP suspicious for decompensated CHF. Has improved somewhat with IV Lasix. - Continue IV Lasix 40 mg daily. - 2-D echo 10/16/15: Showed several abnormalities including LVEF 40-45 percent, diffuse LV hypokinesis, severe AR, moderate MR, moderate TR and severe pulmonary artery hypertension. Repeat 2-D echocardiogram 6/2: LVEF 35-40 percent, diffuse hypokinesis, moderate to severe AR. - Improved. - Cardiology  consultation and follow-up appreciated >plan left and right heart cath on 6/5 followed by consideration for TEE and aortic valve replacement.  Severe pulmonary artery hypertension - Unclear etiology.? Related to severe AR. - Low index of suspicion for acute PE. CTA chest 10/15/15: Questionable filling defect in left lower lobe pulmonary artery favored artifact or sequelae of remote pulmonary embolism and did not have typical appearance of acute PE. The remaining pulmonary arteries were normal. Also raised question of pulmonary edema. - RHC on 6/5  Severe AR, moderate MR & TR - Cardiology follow-up appreciated. Indicating possible AVR consideration after cardiac on 6/5.  Elevated troponin - Likely due to demand ischemia from decompensated CHF and respiratory distress. Mildly elevated troponin but mostly a flat trend. 2-D echo results as below.  Essential hypertension, uncontrolled - Continue lisinopril 40 MG daily. Amlodipine and carvedilol added this admission. Better.  Hyperlipidemia - Continue Lipitor  Recent CVA - No residual deficits. Patient voluntarily discontinued Plavix and remains on aspirin-continue. as per RN,? Strokelike symptoms overnight and respiratory response evaluated (no notes in chart) however no stroke findings and symptoms felt to be due to dyspnea. No recurrence of strokelike symptoms.  Chronic hepatitis C -Not on treatment at this time. Outpatient follow-up at the [facility] regarding treatment.  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 Recent CT chest, abdomen and pelvis without concerning etiology. TSH: 1.828. A1c 5.3. - Consider outpatient screening colonoscopy if not already performed.  - Dietitian consultation appreciated.  Intermittent diarrhea - Patient not able to provide consistent history. Need to discuss this with him further. Consideration for screening colonoscopy as  outpatient. No diarrhea reported in the hospital.  Marijuana abuse - Cessation counseled.  Hypokalemia - Replaced  Cluster headaches - Symptomatic Rx. Has not complained of headaches.  DVT prophylaxis: Lovenox Code Status: Full Family Communication: Discussed with patient. No family at bedside.  Disposition Plan: admitted to stepdown unit-transferred to cardiac telemetry floor on 6/3. DC home when medically stable.   Consultants:   Cardiology  Procedures:   2-D echo 02/06/16: Study Conclusions  - Left ventricle: The cavity size was normal. Wall thickness was  normal. Systolic function was moderately reduced. The estimated  ejection fraction was in the range of 35% to 40%. Diffuse  hypokinesis. There was fusion of early and atrial contributions  to ventricular filling. The study is not technically sufficient  to allow evaluation of LV diastolic function. - Aortic valve: Moderately calcified annulus. Mild diffuse  calcification. There was moderate to severe regurgitation. - Mitral valve: There was mild regurgitation. - Left atrium: The atrium was severely dilated. - Atrial septum: No defect or patent foramen ovale was identified. - Pulmonary arteries: PA peak pressure: 45 mm Hg (S).  Impressions:  - The right ventricular systolic pressure was increased consistent  with moderate pulmonary hypertension.  Antimicrobials:   None    Subjective: No dyspnea, chest pain or headache. As per RN, no acute issues.  Objective:  Filed Vitals:   02/07/16 0600 02/07/16 0700 02/07/16 0816 02/07/16 1133  BP: 139/42 129/80 157/63 156/67  Pulse: 68 66 84 78  Temp:   97.9 F (36.6 C) 97.2 F (36.2 C)  TempSrc:   Oral Oral  Resp: 26 16 22 21   Height:      Weight:      SpO2: 94% 97% 96% 98%    Intake/Output Summary (Last 24 hours) at 02/07/16 1152 Last data filed at 02/07/16 1135  Gross per 24 hour  Intake    967 ml  Output   1450 ml  Net   -483 ml   Filed  Weights   02/05/16 2000 02/06/16 0352 02/07/16 0400  Weight: 50.7 kg (111 lb 12.4 oz) 48.444 kg (106 lb 12.8 oz) 49.624 kg (109 lb 6.4 oz)    Examination:  General exam: Small built and frail chronically ill-looking middle-age male lying comfortably Supine in bed. Respiratory system: Clear to auscultation. Respiratory effort normal. Cardiovascular system: S1 & S2 heard, RRR. No JVD, rubs, gallops or clicks. No pedal edema. Systolic murmur 2 x 6 at apex and early diastolic murmur at left lower sternal age. Telemetry: Sinus rhythm. 5 beat NSVT on 6/3 at 4:10 AM. Gastrointestinal system: Abdomen is nondistended, soft and nontender. No organomegaly or masses felt. Normal bowel sounds heard. Central nervous system: Alert and oriented. No focal neurological deficits. Extremities: Symmetric 5 x 5 power. Skin: No rashes, lesions or ulcers Psychiatry: Judgement and insight appear normal. Mood & affect appropriate.     Data Reviewed: I have personally reviewed following labs and imaging studies  CBC:  Recent Labs Lab 02/05/16 1244  WBC 7.3  HGB 13.9  HCT 41.2  MCV 87.8  PLT 99991111   Basic Metabolic Panel:  Recent Labs Lab 02/05/16 1244 02/06/16 1115 02/07/16 0206  NA 140 140 142  K 3.3* 3.4* 3.8  CL 105 104 98*  CO2 23 27 33*  GLUCOSE 106* 108* 99  BUN 14 16 23*  CREATININE 1.04 1.00 0.91  CALCIUM 9.3 9.6 10.2   GFR: Estimated Creatinine Clearance: 56.8 mL/min (by C-G formula based on Cr of 0.91). Liver Function Tests: No results for input(s): AST, ALT, ALKPHOS, BILITOT, PROT, ALBUMIN in  the last 168 hours. No results for input(s): LIPASE, AMYLASE in the last 168 hours. No results for input(s): AMMONIA in the last 168 hours. Coagulation Profile: No results for input(s): INR, PROTIME in the last 168 hours. Cardiac Enzymes:  Recent Labs Lab 02/05/16 1244 02/06/16 0215 02/06/16 0824 02/06/16 1433  TROPONINI 0.09* 0.14* 0.12* 0.09*   BNP (last 3 results) No results  for input(s): PROBNP in the last 8760 hours. HbA1C: No results for input(s): HGBA1C in the last 72 hours. CBG: No results for input(s): GLUCAP in the last 168 hours. Lipid Profile: No results for input(s): CHOL, HDL, LDLCALC, TRIG, CHOLHDL, LDLDIRECT in the last 72 hours. Thyroid Function Tests: No results for input(s): TSH, T4TOTAL, FREET4, T3FREE, THYROIDAB in the last 72 hours. Anemia Panel: No results for input(s): VITAMINB12, FOLATE, FERRITIN, TIBC, IRON, RETICCTPCT in the last 72 hours.  Sepsis Labs: No results for input(s): PROCALCITON, LATICACIDVEN in the last 168 hours.  Recent Results (from the past 240 hour(s))  MRSA PCR Screening     Status: None   Collection Time: 02/05/16  6:41 PM  Result Value Ref Range Status   MRSA by PCR NEGATIVE NEGATIVE Final    Comment:        The GeneXpert MRSA Assay (FDA approved for NASAL specimens only), is one component of a comprehensive MRSA colonization surveillance program. It is not intended to diagnose MRSA infection nor to guide or monitor treatment for MRSA infections.          Radiology Studies: Dg Chest 2 View  02/05/2016  CLINICAL DATA:  Patient with shortness of breath, worsening for multiple months. EXAM: CHEST  2 VIEW COMPARISON:  Chest CT 10/15/2015; chest radiograph 10/15/2015. FINDINGS: Stable cardiac no consolidative pulmonary opacities. No pleural effusion or pneumothorax. Postsurgical changes proximal right humerus. Thoracic spine degenerative changes. IMPRESSION: No acute cardiopulmonary process. Electronically Signed   By: Lovey Newcomer M.D.   On: 02/05/2016 13:13        Scheduled Meds: . amLODipine  5 mg Oral Daily  . aspirin EC  325 mg Oral Daily  . atorvastatin  10 mg Oral q1800  . carvedilol  3.125 mg Oral BID WC  . enoxaparin (LOVENOX) injection  40 mg Subcutaneous Q24H  . feeding supplement (ENSURE ENLIVE)  237 mL Oral TID BM  . furosemide  40 mg Intravenous Daily  . lisinopril  40 mg Oral Daily    . sodium chloride flush  3 mL Intravenous Q12H   Continuous Infusions:    LOS: 2 days    Time spent: 30 minutes.    [facility], MD [facility] Pager [phone]  If 7PM-7AM, please contact night-coverage [email or web address] Password [email or web address] 02/07/2016, 11:52 AM

## 2016-02-07 NOTE — Progress Notes (Signed)
Pt transferred 3E24, report called to floor nurse and patient transferred in wheelchair with all belongings.

## 2016-02-08 LAB — BASIC METABOLIC PANEL
ANION GAP: 10 (ref 5–15)
BUN: 22 mg/dL — ABNORMAL HIGH (ref 6–20)
CALCIUM: 9.6 mg/dL (ref 8.9–10.3)
CHLORIDE: 98 mmol/L — AB (ref 101–111)
CO2: 30 mmol/L (ref 22–32)
CREATININE: 0.77 mg/dL (ref 0.61–1.24)
GFR calc non Af Amer: >60 mL/min (ref >60)
Glucose, Bld: 81 mg/dL (ref 65–99)
Potassium: 3.8 mmol/L (ref 3.5–5.1)
SODIUM: 138 mmol/L (ref 135–145)

## 2016-02-08 NOTE — Progress Notes (Signed)
PROGRESS NOTE  Alexis [name]  [id number] DOB: 1950/12/03  DOA: 02/05/2016 PCP: [name], MD   Brief Narrative:  65 y.o. male , married but separated, lives alone, independent of activities of daily living, PMH of HTN, chronic hepatitis C, cluster headache, remote former smoker, marijuana abuse, hospitalized 10/15/15-10/18/15 for acute respiratory failure felt to be secondary to acute bronchitis and acute systolic CHF, LVEF 123XX123 percent, acute CVA presented to [facility] ED on 02/05/16 with progressively worsening dyspnea. In ED, troponin 0.09, BNP >4500, chest x-ray without acute cardiopulmonary process, EKG with LVH. Admitted to SDU for Acute on chronic Systolic CHF and severe AI. Cardiology consulted, improving, plan R&L Heart Cath on 6/5 and considering TEE & AVR.  Assessment & Plan:   Principal Problem:   Acute on chronic systolic CHF (congestive heart failure) (HCC) Active Problems:   Chronic hepatitis C without hepatic coma (HCC)   Chronic cluster headache, not intractable   Loss of weight   Essential hypertension   Protein-calorie malnutrition, severe   Pulmonary hypertension (HCC)   History of CVA (cerebrovascular accident)   Hypokalemia   Marijuana abuse   Systolic CHF, acute on chronic (HCC)   Troponin level elevated   Acute on chronic systolic CHF/cardiomyopathy - Patient has progressive worsening symptoms including DOE, orthopnea and PND. Chronic atypical nonexertional chest pain. - Although no significant peripheral edema, typical symptomatology and markedly elevated BNP suspicious for decompensated CHF. Has improved somewhat with IV Lasix. - Continue IV Lasix 40 mg daily. - 2-D echo 10/16/15: Showed several abnormalities including LVEF 40-45 percent, diffuse LV hypokinesis, severe AR, moderate MR, moderate TR and severe pulmonary artery hypertension. Repeat 2-D echocardiogram 6/2: LVEF 35-40 percent, diffuse hypokinesis, moderate to severe AR. - Improved and compensated. -  Cardiology consultation and follow-up appreciated >plan left and right heart cath on 6/5 followed by consideration for TEE and aortic valve replacement.  Severe pulmonary artery hypertension - Unclear etiology.? Related to severe AR. - Low index of suspicion for acute PE. CTA chest 10/15/15: Questionable filling defect in left lower lobe pulmonary artery favored artifact or sequelae of remote pulmonary embolism and did not have typical appearance of acute PE. The remaining pulmonary arteries were normal. Also raised question of pulmonary edema. - RHC on 6/5  Severe AR, moderate MR & TR - Cardiology follow-up appreciated. Indicating possible AVR consideration after cardiac on 6/5.  Elevated troponin - Likely due to demand ischemia from decompensated CHF and respiratory distress. Mildly elevated troponin but mostly a flat trend. 2-D echo results as below.  Essential hypertension, uncontrolled - Continue lisinopril 40 MG daily. Amlodipine and carvedilol added this admission. Better.  Hyperlipidemia - Continue Lipitor  Recent CVA - No residual deficits. Patient voluntarily discontinued Plavix and remains on aspirin-continue. as per RN,? Strokelike symptoms overnight and respiratory response evaluated (no notes in chart) however no stroke findings and symptoms felt to be due to dyspnea. No recurrence of strokelike symptoms.  Chronic hepatitis C -Not on treatment at this time. Outpatient follow-up at the [facility] regarding treatment.  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 Recent CT chest, abdomen and pelvis without concerning etiology. TSH: 1.828. A1c 5.3. - Consider outpatient screening colonoscopy if not already performed.  - Dietitian consultation appreciated.  Intermittent diarrhea - Patient not able to provide consistent history. Need to discuss this with him further. Consideration for screening colonoscopy  as outpatient. No diarrhea reported in the hospital.  Marijuana abuse -  Cessation counseled.  Hypokalemia - Replaced  Cluster headaches - Symptomatic Rx. Has not complained of headaches.  DVT prophylaxis: Lovenox Code Status: Full Family Communication: Discussed with patient. No family at bedside.  Disposition Plan: admitted to stepdown unit-transferred to cardiac telemetry floor on 6/3. DC home when medically stable.   Consultants:   Cardiology  Procedures:   2-D echo 02/06/16: Study Conclusions  - Left ventricle: The cavity size was normal. Wall thickness was  normal. Systolic function was moderately reduced. The estimated  ejection fraction was in the range of 35% to 40%. Diffuse  hypokinesis. There was fusion of early and atrial contributions  to ventricular filling. The study is not technically sufficient  to allow evaluation of LV diastolic function. - Aortic valve: Moderately calcified annulus. Mild diffuse  calcification. There was moderate to severe regurgitation. - Mitral valve: There was mild regurgitation. - Left atrium: The atrium was severely dilated. - Atrial septum: No defect or patent foramen ovale was identified. - Pulmonary arteries: PA peak pressure: 45 mm Hg (S).  Impressions:  - The right ventricular systolic pressure was increased consistent  with moderate pulmonary hypertension.  Antimicrobials:   None    Subjective: No dyspnea, chest pain or headache. As per RN, no acute issues.  Objective:  Filed Vitals:   02/07/16 1434 02/07/16 2100 02/08/16 0550 02/08/16 0812  BP: 170/56 155/65 152/77 165/70  Pulse:  76 70 83  Temp: 97.6 F (36.4 C) 97.6 F (36.4 C) 98.2 F (36.8 C) 98.7 F (37.1 C)  TempSrc: Oral Oral Oral Oral  Resp: 18 16 16 18   Height: 5\' 4"  (1.626 m)     Weight: 49.8 kg (109 lb 12.6 oz)  49.669 kg (109 lb 8 oz)   SpO2: 100% 100% 99% 98%    Intake/Output Summary (Last 24 hours) at 02/08/16 1129 Last data  filed at 02/08/16 [id number]  Gross per 24 hour  Intake    850 ml  Output   1200 ml  Net   -350 ml   Filed Weights   02/07/16 0400 02/07/16 1434 02/08/16 0550  Weight: 49.624 kg (109 lb 6.4 oz) 49.8 kg (109 lb 12.6 oz) 49.669 kg (109 lb 8 oz)    Examination:  General exam: Small built and frail chronically ill-looking middle-age male lying comfortably supine in bed. Respiratory system: Clear to auscultation. Respiratory effort normal. Cardiovascular system: S1 & S2 heard, RRR. No JVD, rubs, gallops or clicks. No pedal edema. Systolic murmur 2 x 6 at apex and early diastolic murmur at left lower sternal age. Telemetry: Sinus rhythm. 6 beat NSVT at 1:23 PM on 6/3. Gastrointestinal system: Abdomen is nondistended, soft and nontender. No organomegaly or masses felt. Normal bowel sounds heard. Central nervous system: Alert and oriented. No focal neurological deficits. Extremities: Symmetric 5 x 5 power. Skin: No rashes, lesions or ulcers Psychiatry: Judgement and insight appear normal. Mood & affect appropriate.     Data Reviewed: I have personally reviewed following labs and imaging studies  CBC:  Recent Labs Lab 02/05/16 1244  WBC 7.3  HGB 13.9  HCT 41.2  MCV 87.8  PLT 99991111   Basic Metabolic Panel:  Recent Labs Lab 02/05/16 1244 02/06/16 1115 02/07/16 0206 02/08/16 0306  NA 140 140 142 138  K 3.3* 3.4* 3.8 3.8  CL 105 104 98* 98*  CO2 23 27 33* 30  GLUCOSE 106* 108* 99 81  BUN 14 16 23* 22*  CREATININE 1.04 1.00 0.91 0.77  CALCIUM 9.3 9.6  10.2 9.6   GFR: Estimated Creatinine Clearance: 64.7 mL/min (by C-G formula based on Cr of 0.77). Liver Function Tests: No results for input(s): AST, ALT, ALKPHOS, BILITOT, PROT, ALBUMIN in the last 168 hours. No results for input(s): LIPASE, AMYLASE in the last 168 hours. No results for input(s): AMMONIA in the last 168 hours. Coagulation Profile: No results for input(s): INR, PROTIME in the last 168 hours. Cardiac  Enzymes:  Recent Labs Lab 02/05/16 1244 02/06/16 0215 02/06/16 0824 02/06/16 1433  TROPONINI 0.09* 0.14* 0.12* 0.09*   BNP (last 3 results) No results for input(s): PROBNP in the last 8760 hours. HbA1C: No results for input(s): HGBA1C in the last 72 hours. CBG: No results for input(s): GLUCAP in the last 168 hours. Lipid Profile: No results for input(s): CHOL, HDL, LDLCALC, TRIG, CHOLHDL, LDLDIRECT in the last 72 hours. Thyroid Function Tests: No results for input(s): TSH, T4TOTAL, FREET4, T3FREE, THYROIDAB in the last 72 hours. Anemia Panel: No results for input(s): VITAMINB12, FOLATE, FERRITIN, TIBC, IRON, RETICCTPCT in the last 72 hours.  Sepsis Labs: No results for input(s): PROCALCITON, LATICACIDVEN in the last 168 hours.  Recent Results (from the past 240 hour(s))  MRSA PCR Screening     Status: None   Collection Time: 02/05/16  6:41 PM  Result Value Ref Range Status   MRSA by PCR NEGATIVE NEGATIVE Final    Comment:        The GeneXpert MRSA Assay (FDA approved for NASAL specimens only), is one component of a comprehensive MRSA colonization surveillance program. It is not intended to diagnose MRSA infection nor to guide or monitor treatment for MRSA infections.          Radiology Studies: No results found.      Scheduled Meds: . amLODipine  5 mg Oral Daily  . aspirin EC  325 mg Oral Daily  . atorvastatin  10 mg Oral q1800  . carvedilol  3.125 mg Oral BID WC  . enoxaparin (LOVENOX) injection  40 mg Subcutaneous Q24H  . feeding supplement (ENSURE ENLIVE)  237 mL Oral TID BM  . furosemide  40 mg Intravenous Daily  . lisinopril  40 mg Oral Daily  . sodium chloride flush  3 mL Intravenous Q12H   Continuous Infusions:    LOS: 3 days    Time spent: 20 minutes.    [facility], MD [facility] Pager [phone]  If 7PM-7AM, please contact night-coverage [email or web address] Password [email or web address] 02/08/2016, 11:29 AM

## 2016-02-09 ENCOUNTER — Encounter (HOSPITAL_COMMUNITY)
Admission: EM | Disposition: A | Discharge status: Home / Self Care | Payer: Self-pay | Source: Home / Self Care | Attending: Internal Medicine

## 2016-02-09 ENCOUNTER — Inpatient Hospital Stay (HOSPITAL_COMMUNITY): Payer: Medicare HMO

## 2016-02-09 DIAGNOSIS — I420 Dilated cardiomyopathy: Secondary | ICD-10-CM

## 2016-02-09 DIAGNOSIS — I2510 Atherosclerotic heart disease of native coronary artery without angina pectoris: Secondary | ICD-10-CM

## 2016-02-09 DIAGNOSIS — I5021 Acute systolic (congestive) heart failure: Secondary | ICD-10-CM

## 2016-02-09 DIAGNOSIS — R52 Pain, unspecified: Secondary | ICD-10-CM

## 2016-02-09 DIAGNOSIS — I351 Nonrheumatic aortic (valve) insufficiency: Secondary | ICD-10-CM | POA: Insufficient documentation

## 2016-02-09 HISTORY — PX: CARDIAC CATHETERIZATION: SHX172

## 2016-02-09 LAB — POCT I-STAT 3, ART BLOOD GAS (G3+)
Acid-Base Excess: 5 mmol/L — ABNORMAL HIGH (ref 0.0–2.0)
BICARBONATE: 29.8 meq/L — AB (ref 20.0–24.0)
O2 Saturation: 99 %
PCO2 ART: 45.7 mmHg — AB (ref 35.0–45.0)
PH ART: 7.422 (ref 7.350–7.450)
PO2 ART: 118 mmHg — AB (ref 80.0–100.0)
TCO2: 31 mmol/L (ref 0–100)

## 2016-02-09 LAB — BASIC METABOLIC PANEL
Anion gap: 11 (ref 5–15)
BUN: 21 mg/dL — AB (ref 6–20)
CHLORIDE: 98 mmol/L — AB (ref 101–111)
CO2: 28 mmol/L (ref 22–32)
Calcium: 9.6 mg/dL (ref 8.9–10.3)
Creatinine, Ser: 0.86 mg/dL (ref 0.61–1.24)
GFR calc non Af Amer: >60 mL/min (ref >60)
Glucose, Bld: 100 mg/dL — ABNORMAL HIGH (ref 65–99)
POTASSIUM: 4.2 mmol/L (ref 3.5–5.1)
SODIUM: 137 mmol/L (ref 135–145)

## 2016-02-09 LAB — CBC
HCT: 42.9 % (ref 39.0–52.0)
HCT: 39.3 % (ref 39.0–52.0)
HEMOGLOBIN: 14.4 g/dL (ref 13.0–17.0)
Hemoglobin: 13.3 g/dL (ref 13.0–17.0)
MCH: 29.8 pg (ref 26.0–34.0)
MCH: 29.6 pg (ref 26.0–34.0)
MCHC: 33.6 g/dL (ref 30.0–36.0)
MCHC: 33.8 g/dL (ref 30.0–36.0)
MCV: 88.8 fL (ref 78.0–100.0)
MCV: 87.3 fL (ref 78.0–100.0)
PLATELETS: 226 10*3/uL (ref 150–400)
Platelets: 207 10*3/uL (ref 150–400)
RBC: 4.83 MIL/uL (ref 4.22–5.81)
RBC: 4.5 MIL/uL (ref 4.22–5.81)
RDW: 14.4 % (ref 11.5–15.5)
RDW: 14.4 % (ref 11.5–15.5)
WBC: 6.1 10*3/uL (ref 4.0–10.5)
WBC: 4.2 10*3/uL (ref 4.0–10.5)

## 2016-02-09 LAB — POCT I-STAT 3, VENOUS BLOOD GAS (G3P V)
Acid-Base Excess: 5 mmol/L — ABNORMAL HIGH (ref 0.0–2.0)
BICARBONATE: 31.4 meq/L — AB (ref 20.0–24.0)
O2 SAT: 71 %
PCO2 VEN: 52 mmHg — AB (ref 45.0–50.0)
PO2 VEN: 39 mmHg (ref 31.0–45.0)
TCO2: 33 mmol/L (ref 0–100)
pH, Ven: 7.389 — ABNORMAL HIGH (ref 7.250–7.300)

## 2016-02-09 LAB — POCT ACTIVATED CLOTTING TIME: Activated Clotting Time: 198 seconds

## 2016-02-09 LAB — PROTIME-INR
INR: 1.03 (ref 0.00–1.49)
PROTHROMBIN TIME: 13.7 s (ref 11.6–15.2)

## 2016-02-09 SURGERY — RIGHT/LEFT HEART CATH AND CORONARY ANGIOGRAPHY
Anesthesia: LOCAL

## 2016-02-09 MED ORDER — FENTANYL CITRATE (PF) 100 MCG/2ML IJ SOLN
INTRAMUSCULAR | Status: AC
  Filled 2016-02-09: qty 2

## 2016-02-09 MED ORDER — SODIUM CHLORIDE 0.9% FLUSH
3.0000 mL | Freq: Two times a day (BID) | INTRAVENOUS | Status: DC
  Administered 2016-02-09 (×2): 3 mL via INTRAVENOUS

## 2016-02-09 MED ORDER — VERAPAMIL HCL 2.5 MG/ML IV SOLN
INTRAVENOUS | Status: AC
  Filled 2016-02-09: qty 2

## 2016-02-09 MED ORDER — IOPAMIDOL (ISOVUE-370) INJECTION 76%
INTRAVENOUS | Status: AC
  Filled 2016-02-09: qty 100

## 2016-02-09 MED ORDER — FUROSEMIDE 40 MG PO TABS
40.0000 mg | ORAL_TABLET | Freq: Every day | ORAL | Status: DC
  Administered 2016-02-09 – 2016-02-11 (×3): 40 mg via ORAL
  Filled 2016-02-09 (×2): qty 1

## 2016-02-09 MED ORDER — SODIUM CHLORIDE 0.9% FLUSH: 3.0000 mL | INTRAVENOUS | Status: DC | PRN

## 2016-02-09 MED ORDER — BOOST / RESOURCE BREEZE PO LIQD
1.0000 | Freq: Three times a day (TID) | ORAL | Status: DC
  Administered 2016-02-09 – 2016-02-10 (×4): 1 via ORAL
  Filled 2016-02-09 (×9): qty 1

## 2016-02-09 MED ORDER — HEPARIN (PORCINE) IN NACL 2-0.9 UNIT/ML-% IJ SOLN
INTRAMUSCULAR | Status: AC
  Filled 2016-02-09: qty 1500

## 2016-02-09 MED ORDER — MIDAZOLAM HCL 2 MG/2ML IJ SOLN
INTRAMUSCULAR | Status: AC
  Filled 2016-02-09: qty 2

## 2016-02-09 MED ORDER — MIDAZOLAM HCL 2 MG/2ML IJ SOLN
INTRAMUSCULAR | Status: DC | PRN
  Administered 2016-02-09 (×3): 1 mg via INTRAVENOUS

## 2016-02-09 MED ORDER — ASPIRIN 81 MG PO CHEW
81.0000 mg | CHEWABLE_TABLET | ORAL | Status: AC
  Administered 2016-02-09: 81 mg via ORAL
  Filled 2016-02-09: qty 1

## 2016-02-09 MED ORDER — LIDOCAINE HCL (PF) 1 % IJ SOLN
INTRAMUSCULAR | Status: AC
  Filled 2016-02-09: qty 30

## 2016-02-09 MED ORDER — FENTANYL CITRATE (PF) 100 MCG/2ML IJ SOLN
INTRAMUSCULAR | Status: DC | PRN
  Administered 2016-02-09: 50 ug via INTRAVENOUS
  Administered 2016-02-09: 25 ug via INTRAVENOUS

## 2016-02-09 MED ORDER — HEPARIN SODIUM (PORCINE) 1000 UNIT/ML IJ SOLN
INTRAMUSCULAR | Status: DC | PRN
  Administered 2016-02-09: 2500 [IU] via INTRAVENOUS

## 2016-02-09 MED ORDER — VERAPAMIL HCL 2.5 MG/ML IV SOLN
INTRAVENOUS | Status: DC | PRN
  Administered 2016-02-09: 13:00:00 via INTRA_ARTERIAL

## 2016-02-09 MED ORDER — SODIUM CHLORIDE 0.9 % IV SOLN
250.0000 mL | INTRAVENOUS | Status: DC | PRN
  Administered 2016-02-11: 500 mL via INTRAVENOUS

## 2016-02-09 MED ORDER — LIDOCAINE HCL (PF) 1 % IJ SOLN
INTRAMUSCULAR | Status: DC | PRN
  Administered 2016-02-09: 10 mL
  Administered 2016-02-09: 2 mL

## 2016-02-09 MED ORDER — FENTANYL CITRATE (PF) 100 MCG/2ML IJ SOLN
25.0000 ug | INTRAMUSCULAR | Status: DC | PRN
  Administered 2016-02-09: 25 ug via INTRAVENOUS
  Administered 2016-02-09 – 2016-02-10 (×2): 50 ug via INTRAVENOUS
  Administered 2016-02-10: 25 ug via INTRAVENOUS
  Administered 2016-02-11: 06:00:00 50 ug via INTRAVENOUS
  Administered 2016-02-11: 10:00:00 25 ug via INTRAVENOUS
  Filled 2016-02-09 (×5): qty 2

## 2016-02-09 MED ORDER — HEPARIN (PORCINE) IN NACL 2-0.9 UNIT/ML-% IJ SOLN
INTRAMUSCULAR | Status: DC | PRN
  Administered 2016-02-09: 1500 mL

## 2016-02-09 MED ORDER — IOPAMIDOL (ISOVUE-370) INJECTION 76%
INTRAVENOUS | Status: DC | PRN
  Administered 2016-02-09: 70 mL via INTRAVENOUS

## 2016-02-09 MED ORDER — SODIUM CHLORIDE 0.9% FLUSH
3.0000 mL | Freq: Two times a day (BID) | INTRAVENOUS | Status: DC
  Administered 2016-02-09 – 2016-02-11 (×4): 3 mL via INTRAVENOUS

## 2016-02-09 MED ORDER — SODIUM CHLORIDE 0.9 % IV SOLN
INTRAVENOUS | Status: DC
  Administered 2016-02-09: 05:00:00 via INTRAVENOUS

## 2016-02-09 MED ORDER — SODIUM CHLORIDE 0.9 % WEIGHT BASED INFUSION
3.0000 mL/kg/h | INTRAVENOUS | Status: AC
  Administered 2016-02-09: 3 mL/kg/h via INTRAVENOUS

## 2016-02-09 MED ORDER — MORPHINE SULFATE (PF) 2 MG/ML IV SOLN
2.0000 mg | INTRAVENOUS | Status: DC | PRN
  Administered 2016-02-09 – 2016-02-10 (×4): 2 mg via INTRAVENOUS
  Filled 2016-02-09 (×5): qty 1

## 2016-02-09 MED ORDER — HEPARIN SODIUM (PORCINE) 1000 UNIT/ML IJ SOLN
INTRAMUSCULAR | Status: AC
  Filled 2016-02-09: qty 1

## 2016-02-09 MED ORDER — SODIUM CHLORIDE 0.9 % IV SOLN: 250.0000 mL | INTRAVENOUS | Status: DC | PRN

## 2016-02-09 SURGICAL SUPPLY — 11 items
CATH OPTITORQUE TIG 4.0 5F (CATHETERS) ×2 IMPLANT
CATH SWAN GANZ 7F STRAIGHT (CATHETERS) ×2 IMPLANT
DEVICE RAD COMP TR BAND LRG (VASCULAR PRODUCTS) ×2 IMPLANT
GLIDESHEATH SLEND A-KIT 6F 22G (SHEATH) ×2 IMPLANT
KIT HEART LEFT (KITS) ×2 IMPLANT
KIT HEART RIGHT NAMIC (KITS) ×2 IMPLANT
PACK CARDIAC CATHETERIZATION (CUSTOM PROCEDURE TRAY) ×2 IMPLANT
SHEATH PINNACLE 7F 10CM (SHEATH) ×2 IMPLANT
TRANSDUCER W/STOPCOCK (MISCELLANEOUS) ×4 IMPLANT
TUBING CIL FLEX 10 FLL-RA (TUBING) ×2 IMPLANT
WIRE SAFE-T 1.5MM-J .035X260CM (WIRE) ×2 IMPLANT

## 2016-02-09 NOTE — Progress Notes (Signed)
PROGRESS NOTE  Alexis [name]  [id number] DOB: 1950-11-19  DOA: 02/05/2016 PCP: Alexis Farm, MD   Brief Narrative:  65 y.o. male , married but separated, lives alone, independent of activities of daily living, PMH of HTN, chronic hepatitis C, cluster headache, remote former smoker, marijuana abuse, hospitalized 10/15/15-10/18/15 for acute respiratory failure felt to be secondary to acute bronchitis and acute systolic CHF, LVEF 123XX123 percent, acute CVA presented to [facility] ED on 02/05/16 with progressively worsening dyspnea. In ED, troponin 0.09, BNP >4500, chest x-ray without acute cardiopulmonary process, EKG with LVH. Admitted to SDU for Acute on chronic Systolic CHF and severe AI. Cardiology consulted, improving, plan R&L Heart Cath on 6/5 and considering TEE & AVR.  Assessment & Plan:   Principal Problem:   Acute on chronic systolic CHF (congestive heart failure) (HCC) Active Problems:   Chronic hepatitis C without hepatic coma (HCC)   Chronic cluster headache, not intractable   Loss of weight   Essential hypertension   Protein-calorie malnutrition, severe   Pulmonary hypertension (HCC)   History of CVA (cerebrovascular accident)   Hypokalemia   Marijuana abuse   Systolic CHF, acute on chronic (HCC)   Troponin level elevated   Acute on chronic systolic CHF/cardiomyopathy - Patient has progressive worsening symptoms including DOE, orthopnea and PND. Chronic atypical nonexertional chest pain. - Although no significant peripheral edema, typical symptomatology and markedly elevated BNP suspicious for decompensated CHF. Has improved somewhat with IV Lasix. - Continue IV Lasix 40 mg daily. - 2-D echo 10/16/15: Showed several abnormalities including LVEF 40-45 percent, diffuse LV hypokinesis, severe AR, moderate MR, moderate TR and severe pulmonary artery hypertension. Repeat 2-D echocardiogram 6/2: LVEF 35-40 percent, diffuse hypokinesis, moderate to severe AR. - Improved and compensated. -  Cardiology consultation and follow-up appreciated >plan left and right heart cath on 6/5 followed by consideration for TEE and aortic valve replacement. - Awaiting cath later today.  Severe pulmonary artery hypertension - Unclear etiology.? Related to severe AR. - Low index of suspicion for acute PE. CTA chest 10/15/15: Questionable filling defect in left lower lobe pulmonary artery favored artifact or sequelae of remote pulmonary embolism and did not have typical appearance of acute PE. The remaining pulmonary arteries were normal. Also raised question of pulmonary edema. - RHC on 6/5  Severe AR, moderate MR & TR - Cardiology follow-up appreciated. Indicating possible AVR consideration after cardiac on 6/5.  Elevated troponin - Likely due to demand ischemia from decompensated CHF and respiratory distress. Mildly elevated troponin but mostly a flat trend. 2-D echo results as below.  Essential hypertension, uncontrolled - Continue lisinopril 40 MG daily. Amlodipine and carvedilol added this admission. Better.  Hyperlipidemia - Continue Lipitor  Recent CVA - No residual deficits. Patient voluntarily discontinued Plavix and remains on aspirin-continue. as per RN,? Strokelike symptoms overnight and respiratory response evaluated (no notes in chart) however no stroke findings and symptoms felt to be due to dyspnea. No recurrence of strokelike symptoms.  Chronic hepatitis C -Not on treatment at this time. Outpatient follow-up at the [facility] regarding treatment.  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 Recent CT chest, abdomen and pelvis without concerning etiology. TSH: 1.828. A1c 5.3. - Consider outpatient screening colonoscopy if not already performed.  - Dietitian consultation appreciated.  Intermittent diarrhea - Patient not able to provide consistent history. Need to discuss this with him further.  Consideration for screening colonoscopy as outpatient. No diarrhea reported in  the hospital.  Marijuana abuse - Cessation counseled.  Hypokalemia - Replaced  Cluster headaches - Symptomatic Rx. Has not complained of headaches.  DVT prophylaxis: Lovenox Code Status: Full Family Communication: Discussed with patient. No family at bedside.  Disposition Plan: admitted to stepdown unit-transferred to cardiac telemetry floor on 6/3. DC home when medically stable.   Consultants:   Cardiology  Procedures:   2-D echo 02/06/16: Study Conclusions  - Left ventricle: The cavity size was normal. Wall thickness was  normal. Systolic function was moderately reduced. The estimated  ejection fraction was in the range of 35% to 40%. Diffuse  hypokinesis. There was fusion of early and atrial contributions  to ventricular filling. The study is not technically sufficient  to allow evaluation of LV diastolic function. - Aortic valve: Moderately calcified annulus. Mild diffuse  calcification. There was moderate to severe regurgitation. - Mitral valve: There was mild regurgitation. - Left atrium: The atrium was severely dilated. - Atrial septum: No defect or patent foramen ovale was identified. - Pulmonary arteries: PA peak pressure: 45 mm Hg (S).  Impressions:  - The right ventricular systolic pressure was increased consistent  with moderate pulmonary hypertension.  Antimicrobials:   None    Subjective: Denies complaints and is waiting for cath-approximately noon today. No dyspnea or chest pain. As per RN, no acute issues.  Objective:  Filed Vitals:   02/09/16 0805 02/09/16 0858 02/09/16 1000 02/09/16 1052  BP: 150/63 149/55 166/65 166/65  Pulse: 72 71    Temp: 98.4 F (36.9 C) 97.1 F (36.2 C)    TempSrc: Oral Oral    Resp: 18 18    Height:      Weight:      SpO2: 99% 99%      Intake/Output Summary (Last 24 hours) at 02/09/16 1109 Last data filed at 02/08/16  1951  Gross per 24 hour  Intake    957 ml  Output    950 ml  Net      7 ml   Filed Weights   02/07/16 1434 02/08/16 0550 02/09/16 0517  Weight: 49.8 kg (109 lb 12.6 oz) 49.669 kg (109 lb 8 oz) 48.852 kg (107 lb 11.2 oz)    Examination:  General exam: Small built and frail chronically ill-looking middle-age male lying comfortably supine in bed. Respiratory system: Clear to auscultation. Respiratory effort normal. Cardiovascular system: S1 & S2 heard, RRR. No JVD, rubs, gallops or clicks. No pedal edema. Systolic murmur 2 x 6 at apex and early diastolic murmur at left lower sternal age. Telemetry: Sinus rhythm.  Gastrointestinal system: Abdomen is nondistended, soft and nontender. No organomegaly or masses felt. Normal bowel sounds heard. Central nervous system: Alert and oriented. No focal neurological deficits. Extremities: Symmetric 5 x 5 power. Skin: No rashes, lesions or ulcers Psychiatry: Judgement and insight appear normal. Mood & affect appropriate.     Data Reviewed: I have personally reviewed following labs and imaging studies  CBC:  Recent Labs Lab 02/05/16 1244 02/09/16 0543  WBC 7.3 6.1  HGB 13.9 14.4  HCT 41.2 42.9  MCV 87.8 88.8  PLT 190 [id number]   Basic Metabolic Panel:  Recent Labs Lab 02/05/16 1244 02/06/16 1115 02/07/16 0206 02/08/16 0306 02/09/16 0543  NA 140 140 142 138 137  K 3.3* 3.4* 3.8 3.8 4.2  CL 105 104 98* 98* 98*  CO2 23 27 33* 30 28  GLUCOSE 106* 108* 99 81 100*  BUN 14 16 23* 22* 21*  CREATININE 1.04  1.00 0.91 0.77 0.86  CALCIUM 9.3 9.6 10.2 9.6 9.6   GFR: Estimated Creatinine Clearance: 59.2 mL/min (by C-G formula based on Cr of 0.86). Liver Function Tests: No results for input(s): AST, ALT, ALKPHOS, BILITOT, PROT, ALBUMIN in the last 168 hours. No results for input(s): LIPASE, AMYLASE in the last 168 hours. No results for input(s): AMMONIA in the last 168 hours. Coagulation Profile:  Recent Labs Lab 02/09/16 0543  INR 1.03    Cardiac Enzymes:  Recent Labs Lab 02/05/16 1244 02/06/16 0215 02/06/16 0824 02/06/16 1433  TROPONINI 0.09* 0.14* 0.12* 0.09*   BNP (last 3 results) No results for input(s): PROBNP in the last 8760 hours. HbA1C: No results for input(s): HGBA1C in the last 72 hours. CBG: No results for input(s): GLUCAP in the last 168 hours. Lipid Profile: No results for input(s): CHOL, HDL, LDLCALC, TRIG, CHOLHDL, LDLDIRECT in the last 72 hours. Thyroid Function Tests: No results for input(s): TSH, T4TOTAL, FREET4, T3FREE, THYROIDAB in the last 72 hours. Anemia Panel: No results for input(s): VITAMINB12, FOLATE, FERRITIN, TIBC, IRON, RETICCTPCT in the last 72 hours.  Sepsis Labs: No results for input(s): PROCALCITON, LATICACIDVEN in the last 168 hours.  Recent Results (from the past 240 hour(s))  MRSA PCR Screening     Status: None   Collection Time: 02/05/16  6:41 PM  Result Value Ref Range Status   MRSA by PCR NEGATIVE NEGATIVE Final    Comment:        The GeneXpert MRSA Assay (FDA approved for NASAL specimens only), is one component of a comprehensive MRSA colonization surveillance program. It is not intended to diagnose MRSA infection nor to guide or monitor treatment for MRSA infections.          Radiology Studies: No results found.      Scheduled Meds: . amLODipine  5 mg Oral Daily  . aspirin EC  325 mg Oral Daily  . atorvastatin  10 mg Oral q1800  . carvedilol  3.125 mg Oral BID WC  . enoxaparin (LOVENOX) injection  40 mg Subcutaneous Q24H  . feeding supplement (ENSURE ENLIVE)  237 mL Oral TID BM  . furosemide  40 mg Intravenous Daily  . lisinopril  40 mg Oral Daily  . sodium chloride flush  3 mL Intravenous Q12H  . sodium chloride flush  3 mL Intravenous Q12H   Continuous Infusions: . sodium chloride 10 mL/hr at 02/09/16 0514     LOS: 4 days    Time spent: 20 minutes.    [facility], MD [facility] Pager [phone]  If 7PM-7AM,  please contact night-coverage [email or web address] Password [email or web address] 02/09/2016, 11:09 AM

## 2016-02-09 NOTE — Progress Notes (Addendum)
1720 pt complaining of spasms and pain at R groin cath site. Cath site level 2 with large hematoma visualized. Charge RN Lauren called to bedside, pressure applied. Cath lab called and arrived quickly to bedside. Pressure applied. Dr. Oval Linsey aware. Will continue to monitor. 1825 [place] at bedside. Pt has pseudoaneurysm. Dr. Oval Linsey called and made aware of situation.

## 2016-02-09 NOTE — Research (Signed)
Lead[employer] II Informed Consent   Subject Name: Alexis [name]  Subject met inclusion and exclusion criteria.  The informed consent form, study requirements and expectations were reviewed with the subject and questions and concerns were addressed prior to the signing of the consent form.  The subject verbalized understanding of the trail requirements.  The subject agreed to participate in the Lead[employer] II trial and signed the informed consent.  The informed consent was obtained prior to performance of any protocol-specific procedures for the subject.  A copy of the signed informed consent was given to the subject and a copy was placed in the subject's medical record.  Jake Bathe Jr. 02/09/2016, 0730 am

## 2016-02-09 NOTE — Progress Notes (Addendum)
Small pseudoaneurysm noted in the right groin measuring 1.13 cm. Pseudo neck is posterior to common femoral artery measuring 0.47 cm.    Landry Mellow, RDMS, RVT 02/09/2016

## 2016-02-09 NOTE — Care Management Important Message (Signed)
Important Message  Patient Details  Name: Alexis [name] MRN: [id number] Date of Birth: 11-29-1950   Medicare Important Message Given:  Yes    Loann Quill 02/09/2016, 11:49 AM

## 2016-02-09 NOTE — Progress Notes (Signed)
Site area: Rt fem vein Site Prior to Removal:  Level 0 Pressure Applied For: 25 min Manual:   Manual hold Patient Status During Pull:  A/O Post Pull Site:  Level 0 Post Pull Instructions Given:  Post instructions given and pt understands Post Pull Pulses Present: 2+ rt dp Dressing Applied:  tegaderm and a 2x2 Bedrest begins @ 15:10:00 Comments: Pt leaves cath lab holding in stable condition. Rt groin unremarkable.

## 2016-02-09 NOTE — Progress Notes (Signed)
Patient Name: Alexis [name] Date of Encounter: 02/09/2016  Principal Problem:   Acute on chronic systolic CHF (congestive heart failure) ([facility]) Active Problems:   Chronic hepatitis C without hepatic coma (HCC)   Chronic cluster headache, not intractable   Loss of weight   Essential hypertension   Protein-calorie malnutrition, severe   Pulmonary hypertension (HCC)   History of CVA (cerebrovascular accident)   Hypokalemia   Marijuana abuse   Systolic CHF, acute on chronic (HCC)   Troponin level elevated   Primary Cardiologist: New/Dr. Stanford Breed Patient Profile: 65 year old male with past medical history of recent CVA, hypertension, chronic systolic CHF, severe aortic regurgitation, and mitral regurgitation. Presented with elevated BNP and SOB.   SUBJECTIVE: Patient in cath lab.    OBJECTIVE Filed Vitals:   02/09/16 0805 02/09/16 0858 02/09/16 1000 02/09/16 1052  BP: 150/63 149/55 166/65 166/65  Pulse: 72 71    Temp: 98.4 F (36.9 C) 97.1 F (36.2 C)    TempSrc: Oral Oral    Resp: 18 18    Height:      Weight:      SpO2: 99% 99%      Intake/Output Summary (Last 24 hours) at 02/09/16 1157 Last data filed at 02/08/16 1951  Gross per 24 hour  Intake    957 ml  Output    950 ml  Net      7 ml   Filed Weights   02/07/16 1434 02/08/16 0550 02/09/16 0517  Weight: 109 lb 12.6 oz (49.8 kg) 109 lb 8 oz (49.669 kg) 107 lb 11.2 oz (48.852 kg)    PHYSICAL EXAM General: Well developed, well nourished, male in no acute distress. Head: Normocephalic, atraumatic.  Neck: Supple without bruits, JVD. Lungs:  Resp regular and unlabored, CTA. Heart: RRR, S1, S2, no S3, S4, or murmur; no rub. Abdomen: Soft, non-tender, non-distended, BS + x 4.  Extremities: No clubbing, cyanosis, edema.  Neuro: Alert and oriented X 3. Moves all extremities spontaneously. Psych: Normal affect.  LABS: CBC: Recent Labs  02/09/16 0543  WBC 6.1  HGB 14.4  HCT 42.9  MCV 88.8  PLT 207    INR: Recent Labs  02/09/16 0543  INR [id number]   Basic Metabolic Panel: Recent Labs  02/08/16 0306 02/09/16 0543  NA 138 137  K 3.8 4.2  CL 98* 98*  CO2 30 28  GLUCOSE 81 100*  BUN 22* 21*  CREATININE 0.77 0.86  CALCIUM 9.6 9.6   Cardiac Enzymes: Recent Labs  02/06/16 1433  TROPONINI 0.09*   BNP:  B NATRIURETIC PEPTIDE  Date/Time Value Ref Range Status  02/05/2016 02:21 PM >4500.0* 0.0 - 100.0 pg/mL Final  10/15/2015 02:20 PM 2457.0* 0.0 - 100.0 pg/mL Final     Current facility-administered medications:  .  0.9 %  sodium chloride infusion, 250 mL, Intravenous, PRN, Modena Jansky, MD .  0.9 %  sodium chloride infusion, 250 mL, Intravenous, PRN, Brittainy M Simmons, PA-C .  0.9 %  sodium chloride infusion, , Intravenous, Continuous, Brittainy Erie Noe, PA-C, Last Rate: 10 mL/hr at 02/09/16 0514 .  acetaminophen (TYLENOL) tablet 650 mg, 650 mg, Oral, Q4H PRN, Modena Jansky, MD, 650 mg at 02/06/16 1853 .  albuterol (PROVENTIL) (2.5 MG/3ML) 0.083% nebulizer solution 2.5 mg, 2.5 mg, Nebulization, Q4H PRN, Modena Jansky, MD .  amLODipine (NORVASC) tablet 5 mg, 5 mg, Oral, Daily, Modena Jansky, MD, 5 mg at 02/09/16 1052 .  aspirin EC tablet 325 mg,  325 mg, Oral, Daily, Modena Jansky, MD, 325 mg at 02/09/16 1053 .  atorvastatin (LIPITOR) tablet 10 mg, 10 mg, Oral, q1800, Modena Jansky, MD, 10 mg at 02/08/16 1832 .  carvedilol (COREG) tablet 3.125 mg, 3.125 mg, Oral, BID WC, Lelon Perla, MD, 3.125 mg at 02/09/16 0825 .  enoxaparin (LOVENOX) injection 40 mg, 40 mg, Subcutaneous, Q24H, Modena Jansky, MD, 40 mg at 02/08/16 2028 .  feeding supplement (ENSURE ENLIVE) (ENSURE ENLIVE) liquid 237 mL, 237 mL, Oral, TID BM, Modena Jansky, MD, 237 mL at 02/08/16 2035 .  furosemide (LASIX) injection 40 mg, 40 mg, Intravenous, Daily, Modena Jansky, MD, 40 mg at 02/09/16 1053 .  hydrALAZINE (APRESOLINE) injection 5 mg, 5 mg, Intravenous, Q6H PRN, Hewitt Shorts Harduk,  PA-C, 5 mg at 02/05/16 2048 .  lisinopril (PRINIVIL,ZESTRIL) tablet 40 mg, 40 mg, Oral, Daily, Modena Jansky, MD, 40 mg at 02/09/16 1053 .  ondansetron (ZOFRAN) injection 4 mg, 4 mg, Intravenous, Q6H PRN, Modena Jansky, MD .  sodium chloride flush (NS) 0.9 % injection 3 mL, 3 mL, Intravenous, Q12H, Modena Jansky, MD, 3 mL at 02/09/16 1000 .  sodium chloride flush (NS) 0.9 % injection 3 mL, 3 mL, Intravenous, PRN, Modena Jansky, MD .  sodium chloride flush (NS) 0.9 % injection 3 mL, 3 mL, Intravenous, Q12H, Brittainy M Simmons, PA-C, 3 mL at 02/09/16 1000 .  sodium chloride flush (NS) 0.9 % injection 3 mL, 3 mL, Intravenous, PRN, Brittainy M Simmons, PA-C .  traMADol (ULTRAM) tablet 50 mg, 50 mg, Oral, Q6H PRN, Hewitt Shorts Harduk, PA-C, 50 mg at 02/09/16 0824 . sodium chloride 10 mL/hr at 02/09/16 0514    TELE:  NSR      ECG: NSR with LVH, T wave inversion in V5 and V6.   Echo  02/06/16   - Left ventricle: The cavity size was normal. Wall thickness was  normal. Systolic function was moderately reduced. The estimated  ejection fraction was in the range of 35% to 40%. Diffuse  hypokinesis. There was fusion of early and atrial contributions  to ventricular filling. The study is not technically sufficient  to allow evaluation of LV diastolic function. - Aortic valve: Moderately calcified annulus. Mild diffuse  calcification. There was moderate to severe regurgitation. - Mitral valve: There was mild regurgitation. - Left atrium: The atrium was severely dilated. - Atrial septum: No defect or patent foramen ovale was identified. - Pulmonary arteries: PA peak pressure: 45 mm Hg (S).  Impressions:  - The right ventricular systolic pressure was increased consistent  with moderate pulmonary hypertension.   Current Medications:  . amLODipine  5 mg Oral Daily  . aspirin EC  325 mg Oral Daily  . atorvastatin  10 mg Oral q1800  . carvedilol  3.125 mg Oral BID WC  .  enoxaparin (LOVENOX) injection  40 mg Subcutaneous Q24H  . feeding supplement (ENSURE ENLIVE)  237 mL Oral TID BM  . furosemide  40 mg Intravenous Daily  . lisinopril  40 mg Oral Daily  . sodium chloride flush  3 mL Intravenous Q12H  . sodium chloride flush  3 mL Intravenous Q12H   . sodium chloride 10 mL/hr at 02/09/16 0514    ASSESSMENT AND PLAN: Principal Problem:   Acute on chronic systolic CHF (congestive heart failure) (HCC) Active Problems:   Chronic hepatitis C without hepatic coma (HCC)   Chronic cluster headache, not intractable   Loss of weight  Essential hypertension   Protein-calorie malnutrition, severe   Pulmonary hypertension (HCC)   History of CVA (cerebrovascular accident)   Hypokalemia   Marijuana abuse   Systolic CHF, acute on chronic (HCC)   Troponin level elevated  1. Acute on chronic systolic CHF: Patient presented with SOB and BNP > 4500. On day 4 of IV diuresis. Question whether I & O data is accurate, he is positive 56 ml for admission. He is for left and right heart cath today, we will have more info about his volume status once resulted.   2. Aortic valve regurgitation: Will assess further with cath today. Continue with diuresis. According to review of notes, could be considered for AVR.   3. HTN: Hypertensive, he is on ACE, amlodipine and Coreg was added on 02/06/16. Can increase Coreg to 6.25 BID.   4. Pulmonary HTN: Will follow results of right heart cath to further assess severity.     Signed, Arbutus Leas , NP 11:57 AM 02/09/2016 Pager [phone]

## 2016-02-10 ENCOUNTER — Encounter (HOSPITAL_COMMUNITY): Payer: Self-pay | Admitting: Cardiology

## 2016-02-10 ENCOUNTER — Encounter (HOSPITAL_COMMUNITY): Payer: Medicare Other

## 2016-02-10 ENCOUNTER — Inpatient Hospital Stay (HOSPITAL_COMMUNITY): Payer: Medicare Other

## 2016-02-10 ENCOUNTER — Other Ambulatory Visit: Payer: Self-pay | Admitting: *Deleted

## 2016-02-10 ENCOUNTER — Inpatient Hospital Stay (HOSPITAL_COMMUNITY): Payer: Medicare HMO

## 2016-02-10 DIAGNOSIS — I2510 Atherosclerotic heart disease of native coronary artery without angina pectoris: Secondary | ICD-10-CM

## 2016-02-10 DIAGNOSIS — R52 Pain, unspecified: Secondary | ICD-10-CM

## 2016-02-10 DIAGNOSIS — IMO0002 Reserved for concepts with insufficient information to code with codable children: Secondary | ICD-10-CM

## 2016-02-10 DIAGNOSIS — Z8673 Personal history of transient ischemic attack (TIA), and cerebral infarction without residual deficits: Secondary | ICD-10-CM

## 2016-02-10 DIAGNOSIS — I351 Nonrheumatic aortic (valve) insufficiency: Secondary | ICD-10-CM

## 2016-02-10 LAB — HCV RNA QUANT
HCV Quantitative Log: 6.079 log10 IU/mL (ref >1.70)
HCV Quantitative: 1200000 IU/mL (ref >50)

## 2016-02-10 MED ORDER — CARVEDILOL 3.125 MG PO TABS
6.2500 mg | ORAL_TABLET | Freq: Two times a day (BID) | ORAL | Status: DC
  Administered 2016-02-10 – 2016-02-11 (×3): 6.25 mg via ORAL
  Filled 2016-02-10 (×4): qty 2

## 2016-02-10 MED ORDER — ANGIOPLASTY BOOK
Freq: Once | Status: DC
  Filled 2016-02-10: qty 1

## 2016-02-10 MED ORDER — LIVING BETTER WITH HEART FAILURE BOOK
Freq: Once | Status: AC
  Administered 2016-02-10: 10:00:00

## 2016-02-10 NOTE — Progress Notes (Signed)
PROGRESS NOTE  Alexis [name]  [id number] DOB: [birth year]  DOA: 02/05/2016 PCP: [name], MD   Brief Narrative:  65 y.o. male , married but separated, lives alone, independent of activities of daily living, PMH of HTN, chronic hepatitis C, cluster headache, remote former smoker, marijuana abuse, hospitalized 10/15/15-10/18/15 for acute respiratory failure felt to be secondary to acute bronchitis and acute systolic CHF, LVEF 123XX123 percent, acute CVA presented to [facility] ED on 02/05/16 with progressively worsening dyspnea. In ED, troponin 0.09, BNP >4500, chest x-ray without acute cardiopulmonary process, EKG with LVH. Admitted to SDU for Acute on chronic Systolic CHF and severe AI. Cardiology consulted. Status post right and left heart cath 02/09/16: Essentially normal coronaries except one notable lesion in small caliber OM branch (90% stenosis), euvolemic by RHC & moderate to severe AR. TCTS consulted by cardiology for surgical evaluation. TEE planned for 6/7.  Assessment & Plan:   Principal Problem:   Acute on chronic systolic CHF (congestive heart failure) (HCC) Active Problems:   Chronic hepatitis C without hepatic coma (HCC)   Chronic cluster headache, not intractable   Loss of weight   Essential hypertension   Protein-calorie malnutrition, severe   Pulmonary hypertension (HCC)   History of CVA (cerebrovascular accident)   Hypokalemia   Marijuana abuse   Troponin level elevated   Aortic regurgitation   CAD in native artery   Postoperative groin pseudoaneurysm (HCC)   Acute on chronic systolic CHF/cardiomyopathy - Patient has progressive worsening symptoms including DOE, orthopnea and PND. Chronic atypical nonexertional chest pain. - Although no significant peripheral edema, typical symptomatology and markedly elevated BNP suspicious for decompensated CHF. Has improved somewhat with IV Lasix. - Treated initially with Lasix IV and post cath, changed to oral Lasix on 6/5. - 2-D echo  10/16/15: Showed several abnormalities including LVEF 40-45 percent, diffuse LV hypokinesis, severe AR, moderate MR, moderate TR and severe pulmonary artery hypertension. Repeat 2-D echocardiogram 6/2: LVEF 35-40 percent, diffuse hypokinesis, moderate to severe AR. - Improved and compensated. - Status post right and left heart cath 02/09/16: Essentially normal coronaries except one notable lesion in small caliber OM branch (90% stenosis), euvolemic by RHC & moderate to severe AR.  Aortic regurgitation, moderate to severe - Left and right heart cath results appreciated. Cardiology follow-up appreciated. Getting TEE on 6/7. Cardiology is requested TCTS to see if candidate for surgery.  Elevated troponin lash CAD - Likely due to demand ischemia from decompensated CHF and respiratory distress. Mildly elevated troponin but mostly a flat trend. 2-D echo results as below. Cath results as below. Continue aspirin, beta blockers, statins.  Essential hypertension, uncontrolled - Continue lisinopril 40 MG daily. Amlodipine and carvedilol added this admission. Better but fluctuating. Carvedilol dose increased.  Hyperlipidemia - Continue Lipitor  Recent CVA - No residual deficits. Patient voluntarily discontinued Plavix and remains on aspirin-continue.   Chronic hepatitis C -Not on treatment at this time. Outpatient follow-up at the [facility] regarding treatment. - Viral load pending.  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 Recent CT chest, abdomen and pelvis without concerning etiology. TSH: 1.828. A1c 5.3. Hepatitis C viral load pending. PSA normal in February. - Consider outpatient screening colonoscopy if not already performed.  - Dietitian consultation appreciated.  Intermittent diarrhea - Patient not able to provide consistent history. Need to discuss this with him further. Consideration for screening colonoscopy as  outpatient. No diarrhea reported in the hospital.  Marijuana abuse -  Cessation counseled.  Hypokalemia - Replaced  Cluster headaches - Symptomatic Rx. Has not complained of headaches.  Small right groin pseudoaneurysm - Management per cardiology. Pseudo-aneurysm has thrombosed.  DVT prophylaxis: Lovenox Code Status: Full Family Communication: Discussed with patient. No family at bedside.  Disposition Plan: admitted to stepdown unit-transferred to cardiac telemetry floor on 6/3. DC home when medically stable.   Consultants:   Cardiology  Procedures:   2-D echo 02/06/16: Study Conclusions  - Left ventricle: The cavity size was normal. Wall thickness was  normal. Systolic function was moderately reduced. The estimated  ejection fraction was in the range of 35% to 40%. Diffuse  hypokinesis. There was fusion of early and atrial contributions  to ventricular filling. The study is not technically sufficient  to allow evaluation of LV diastolic function. - Aortic valve: Moderately calcified annulus. Mild diffuse  calcification. There was moderate to severe regurgitation. - Mitral valve: There was mild regurgitation. - Left atrium: The atrium was severely dilated. - Atrial septum: No defect or patent foramen ovale was identified. - Pulmonary arteries: PA peak pressure: 45 mm Hg (S).  Impressions:  - The right ventricular systolic pressure was increased consistent  with moderate pulmonary hypertension.   Right and left heart cath 02/09/16:  Conclusion  Essentially normal coronary arteries with only one notable lesion in a very small caliber third OM branch with 90% stenosis  There is moderate to severe left ventricular systolic dysfunction and moderate to severe aortic regurgitation by echocardiogram. Follow-up echocardiogram this coming weekend or later on this week.   Thankfully, would appear that the patient's symptoms dyspnea and chest discomfort are not  likely the result of significant coronary disease.  Continued evaluation of aortic insufficiency. He appears to be euvolemic.  Plan: Return to nursing unit for post catheterization care and TR band removal.  He appears to be adequately diuresed.  Antimicrobials:   None    Subjective: Seen this morning. Complained of pain at the right groin cath site. No chest pain or dyspnea.  Objective:  Filed Vitals:   02/09/16 2200 02/09/16 2300 02/10/16 0352 02/10/16 0715  BP: 154/44 158/49 161/40 159/51  Pulse: 70 69 68 68  Temp:   98 F (36.7 C) 97.2 F (36.2 C)  TempSrc:   Oral Oral  Resp: 14 17 23    Height:      Weight:   49 kg (108 lb 0.4 oz)   SpO2: 100% 97% 99% 100%    Intake/Output Summary (Last 24 hours) at 02/10/16 0727 Last data filed at 02/10/16 0355  Gross per 24 hour  Intake    360 ml  Output   1000 ml  Net   -640 ml   Filed Weights   02/08/16 0550 02/09/16 0517 02/10/16 0352  Weight: 49.669 kg (109 lb 8 oz) 48.852 kg (107 lb 11.2 oz) 49 kg (108 lb 0.4 oz)    Examination:  General exam: Small built and frail chronically ill-looking middle-age male lying comfortably supine in bed. Respiratory system: Clear to auscultation. Respiratory effort normal. Cardiovascular system: S1 & S2 heard, RRR. No JVD, rubs, gallops or clicks. No pedal edema. Systolic murmur 2 x 6 at apex and early diastolic murmur at left lower sternal age. Telemetry: Sinus rhythm.  Gastrointestinal system: Abdomen is nondistended, soft and nontender. No organomegaly or masses felt. Normal bowel sounds heard. Mild tenderness in right groin where small hematoma palpable. Good femoral and distal pulsations. Central nervous system: Alert and oriented. No focal neurological deficits.  Extremities: Symmetric 5 x 5 power. Skin: No rashes, lesions or ulcers Psychiatry: Judgement and insight appear normal. Mood & affect appropriate.     Data Reviewed: I have personally reviewed following labs and  imaging studies  CBC:  Recent Labs Lab 02/05/16 1244 02/09/16 0543 02/09/16 1926  WBC 7.3 6.1 4.2  HGB 13.9 14.4 13.3  HCT 41.2 42.9 39.3  MCV 87.8 88.8 87.3  PLT 190 207 [id number]   Basic Metabolic Panel:  Recent Labs Lab 02/05/16 1244 02/06/16 1115 02/07/16 0206 02/08/16 0306 02/09/16 0543  NA 140 140 142 138 137  K 3.3* 3.4* 3.8 3.8 4.2  CL 105 104 98* 98* 98*  CO2 23 27 33* 30 28  GLUCOSE 106* 108* 99 81 100*  BUN 14 16 23* 22* 21*  CREATININE 1.04 1.00 0.91 0.77 0.86  CALCIUM 9.3 9.6 10.2 9.6 9.6   GFR: Estimated Creatinine Clearance: 59.4 mL/min (by C-G formula based on Cr of 0.86). Liver Function Tests: No results for input(s): AST, ALT, ALKPHOS, BILITOT, PROT, ALBUMIN in the last 168 hours. No results for input(s): LIPASE, AMYLASE in the last 168 hours. No results for input(s): AMMONIA in the last 168 hours. Coagulation Profile:  Recent Labs Lab 02/09/16 0543  INR 1.03   Cardiac Enzymes:  Recent Labs Lab 02/05/16 1244 02/06/16 0215 02/06/16 0824 02/06/16 1433  TROPONINI 0.09* 0.14* 0.12* 0.09*   BNP (last 3 results) No results for input(s): PROBNP in the last 8760 hours. HbA1C: No results for input(s): HGBA1C in the last 72 hours. CBG: No results for input(s): GLUCAP in the last 168 hours. Lipid Profile: No results for input(s): CHOL, HDL, LDLCALC, TRIG, CHOLHDL, LDLDIRECT in the last 72 hours. Thyroid Function Tests: No results for input(s): TSH, T4TOTAL, FREET4, T3FREE, THYROIDAB in the last 72 hours. Anemia Panel: No results for input(s): VITAMINB12, FOLATE, FERRITIN, TIBC, IRON, RETICCTPCT in the last 72 hours.  Sepsis Labs: No results for input(s): PROCALCITON, LATICACIDVEN in the last 168 hours.  Recent Results (from the past 240 hour(s))  MRSA PCR Screening     Status: None   Collection Time: 02/05/16  6:41 PM  Result Value Ref Range Status   MRSA by PCR NEGATIVE NEGATIVE Final    Comment:        The GeneXpert MRSA Assay  (FDA approved for NASAL specimens only), is one component of a comprehensive MRSA colonization surveillance program. It is not intended to diagnose MRSA infection nor to guide or monitor treatment for MRSA infections.          Radiology Studies: No results found.      Scheduled Meds: . amLODipine  5 mg Oral Daily  . angioplasty book   Does not apply Once  . aspirin EC  325 mg Oral Daily  . atorvastatin  10 mg Oral q1800  . carvedilol  3.125 mg Oral BID WC  . enoxaparin (LOVENOX) injection  40 mg Subcutaneous Q24H  . feeding supplement  1 Container Oral TID BM  . feeding supplement (ENSURE ENLIVE)  237 mL Oral TID BM  . furosemide  40 mg Oral Daily  . lisinopril  40 mg Oral Daily  . sodium chloride flush  3 mL Intravenous Q12H  . sodium chloride flush  3 mL Intravenous Q12H   Continuous Infusions:     LOS: 5 days    Time spent: 20 minutes.    [facility], MD [facility] Pager [phone]  If 7PM-7AM, please contact night-coverage [email or web address] Password [facility] 02/10/2016,  7:27 AM

## 2016-02-10 NOTE — Progress Notes (Addendum)
*  PRELIMINARY RESULTS* Vascular Ultrasound Limited Right Lower Extremity Arterial Duplex has been completed.  The pseudoaneurysm that was visualized 02/09/16 appears to have thrombosed. There is what appears to be a patent branch that comes off of the posterior aspect of the right common femoral artery. There is a heterogenous area of the right groin measuring 8.5cm, suggestive of possible hematoma.  02/10/2016 9:58 AM Maudry Mayhew, RVT, RDCS, RDMS

## 2016-02-10 NOTE — Progress Notes (Signed)
TEE scheduled tomorrow at 3pm. After careful review of history and examination, the risks and benefits of transesophageal echocardiogram have been explained including risks of esophageal damage, perforation (1:10,000 risk), bleeding, pharyngeal hematoma as well as other potential complications associated with conscious sedation including aspiration, arrhythmia, respiratory failure and death. Alternatives to treatment were discussed, questions were answered. Patient is willing to proceed.   Charlie Pitter, PA-C 02/10/2016 10:11 AM

## 2016-02-10 NOTE — Progress Notes (Signed)
Patient: Alexis [name] / Admit Date: 02/05/2016 / Date of Encounter: 02/10/2016, 7:13 AM   Subjective: No CP. SOB has improved a lot. C/o right groin pain.   Objective: Telemetry: NSR Physical Exam: Blood pressure 161/40, pulse 68, temperature 98 F (36.7 C), temperature source Oral, resp. rate 23, height 5\' 4"  (1.626 m), weight 108 lb 0.4 oz (49 kg), SpO2 99 %. General: Well developed thin cachectic appearing AAM, in no acute distress. Head: Normocephalic, atraumatic, sclera non-icteric, no xanthomas, nares are without discharge. Neck: Negative for carotid bruits. JVP not elevated. Lungs: Clear bilaterally to auscultation without wheezes, rales, or rhonchi. Breathing is unlabored. Heart: RRR S1 S2 + diastolic murmur. Abdomen: Soft, non-tender, non-distended with normoactive bowel sounds. No rebound/guarding. Extremities: No clubbing or cyanosis. No edema. Distal pedal pulses are 2+ and equal bilaterally. Right groin with moderate hematoma, tender to the touch, no obvious bruit auscultated Neuro: Alert and oriented X 3. Moves all extremities spontaneously. Psych:  Responds to questions appropriately with a normal affect.   Intake/Output Summary (Last 24 hours) at 02/10/16 0713 Last data filed at 02/10/16 0355  Gross per 24 hour  Intake    360 ml  Output   1000 ml  Net   -640 ml    Inpatient Medications:  . amLODipine  5 mg Oral Daily  . angioplasty book   Does not apply Once  . aspirin EC  325 mg Oral Daily  . atorvastatin  10 mg Oral q1800  . carvedilol  3.125 mg Oral BID WC  . enoxaparin (LOVENOX) injection  40 mg Subcutaneous Q24H  . feeding supplement  1 Container Oral TID BM  . feeding supplement (ENSURE ENLIVE)  237 mL Oral TID BM  . furosemide  40 mg Oral Daily  . lisinopril  40 mg Oral Daily  . sodium chloride flush  3 mL Intravenous Q12H  . sodium chloride flush  3 mL Intravenous Q12H   Infusions:    Labs:  Recent Labs  02/08/16 0306 02/09/16 0543  NA 138 137   K 3.8 4.2  CL 98* 98*  CO2 30 28  GLUCOSE 81 100*  BUN 22* 21*  CREATININE 0.77 0.86  CALCIUM 9.6 9.6   No results for input(s): AST, ALT, ALKPHOS, BILITOT, PROT, ALBUMIN in the last 72 hours.  Recent Labs  02/09/16 0543 02/09/16 1926  WBC 6.1 4.2  HGB 14.4 13.3  HCT 42.9 39.3  MCV 88.8 87.3  PLT 207 226   No results for input(s): CKTOTAL, CKMB, TROPONINI in the last 72 hours. Invalid input(s): POCBNP No results for input(s): HGBA1C in the last 72 hours.   Radiology/Studies:  Dg Chest 2 View  02/05/2016  CLINICAL DATA:  Patient with shortness of breath, worsening for multiple months. EXAM: CHEST  2 VIEW COMPARISON:  Chest CT 10/15/2015; chest radiograph 10/15/2015. FINDINGS: Stable cardiac no consolidative pulmonary opacities. No pleural effusion or pneumothorax. Postsurgical changes proximal right humerus. Thoracic spine degenerative changes. IMPRESSION: No acute cardiopulmonary process. Electronically Signed   By: Lovey Newcomer M.D.   On: 02/05/2016 13:13     Assessment and Plan  85M with recent CVA, hypertension, recently identified chronic systolic CHF (EF 123XX123 in 10/2015), severe aortic regurgitation, and mitral regurgitation, prior seizure, hepatitis C was admitted with acute on chronic systolic CHF, poorly controlled HTN and weight loss. CT abd/pelvis 10/2015 without intra-abdominal malignancy. F/u echo 02/06/16 showed EF 35-40%, mod-severe AI, mild MR, severely dilated LA, moderate pulm HTN. R/LHC 02/09/16: essentially normal cors  except one notable lesion in small caliber OM branch (90% stenosis), appearing euvolemic by RHC.   1. Acute on chronic systolic CHF due to NICM: Lasix changed to oral yesterday based on cath results. F/u lytes/Cr in AM.  2. Aortic valve regurgitation: Will review timing of TEE with Dr. Oval Linsey. Per her note, she has asked Dr. [name] to see for consideration of surgery.   3. CAD: continue ASA, BB, statin. F/u LFTs in AM for baseline.  4. Cachexia:  unclear cause. CT abd pelvis neg for a malignancy, TSH normal, CTA 10/2015 without pulm malignancy. Hep C viral load pending. Appreciate IM input regarding this.   5. HTN: BP remains variable. Follow this AM. Would consider titration of Coreg to 6.25mg  BID versus addition of Imdur/hydralazine given CHF.  6. Small right groin pseudoaneurysm: will review treatment with Dr. Oval Linsey.  Signed, Melina Copa PA-C Pager: [phone]

## 2016-02-11 ENCOUNTER — Telehealth: Payer: Self-pay | Admitting: Physician Assistant

## 2016-02-11 ENCOUNTER — Inpatient Hospital Stay (HOSPITAL_COMMUNITY): Payer: Medicare HMO

## 2016-02-11 ENCOUNTER — Encounter (HOSPITAL_COMMUNITY): Payer: Self-pay

## 2016-02-11 ENCOUNTER — Encounter (HOSPITAL_COMMUNITY)
Admission: EM | Disposition: A | Discharge status: Home / Self Care | Payer: Self-pay | Source: Home / Self Care | Attending: Internal Medicine

## 2016-02-11 ENCOUNTER — Inpatient Hospital Stay (HOSPITAL_COMMUNITY): Payer: Medicare Other

## 2016-02-11 DIAGNOSIS — I351 Nonrheumatic aortic (valve) insufficiency: Secondary | ICD-10-CM

## 2016-02-11 DIAGNOSIS — I272 Other secondary pulmonary hypertension: Secondary | ICD-10-CM

## 2016-02-11 HISTORY — PX: TEE WITHOUT CARDIOVERSION: SHX5443

## 2016-02-11 LAB — CBC
HCT: 37 % — ABNORMAL LOW (ref 39.0–52.0)
Hemoglobin: 12.1 g/dL — ABNORMAL LOW (ref 13.0–17.0)
MCH: 29.4 pg (ref 26.0–34.0)
MCHC: 32.7 g/dL (ref 30.0–36.0)
MCV: 89.8 fL (ref 78.0–100.0)
PLATELETS: 218 10*3/uL (ref 150–400)
RBC: 4.12 MIL/uL — AB (ref 4.22–5.81)
RDW: 14.6 % (ref 11.5–15.5)
WBC: 7.5 10*3/uL (ref 4.0–10.5)

## 2016-02-11 LAB — COMPREHENSIVE METABOLIC PANEL
ALK PHOS: 70 U/L (ref 38–126)
ALT: 25 U/L (ref 17–63)
ANION GAP: 13 (ref 5–15)
AST: 51 U/L — ABNORMAL HIGH (ref 15–41)
Albumin: 3.5 g/dL (ref 3.5–5.0)
BUN: 24 mg/dL — ABNORMAL HIGH (ref 6–20)
CALCIUM: 9.2 mg/dL (ref 8.9–10.3)
CO2: 28 mmol/L (ref 22–32)
CREATININE: 0.94 mg/dL (ref 0.61–1.24)
Chloride: 95 mmol/L — ABNORMAL LOW (ref 101–111)
Glucose, Bld: 97 mg/dL (ref 65–99)
Potassium: 5.1 mmol/L (ref 3.5–5.1)
Sodium: 136 mmol/L (ref 135–145)
Total Bilirubin: 0.9 mg/dL (ref 0.3–1.2)
Total Protein: 6.2 g/dL — ABNORMAL LOW (ref 6.5–8.1)

## 2016-02-11 SURGERY — ECHOCARDIOGRAM, TRANSESOPHAGEAL
Anesthesia: Moderate Sedation

## 2016-02-11 MED ORDER — MIDAZOLAM HCL 5 MG/ML IJ SOLN
INTRAMUSCULAR | Status: AC
  Filled 2016-02-11: qty 2

## 2016-02-11 MED ORDER — CARVEDILOL 6.25 MG PO TABS: 6.2500 mg | ORAL_TABLET | Freq: Two times a day (BID) | ORAL | Status: DC

## 2016-02-11 MED ORDER — TRAMADOL HCL 50 MG PO TABS: 50.0000 mg | ORAL_TABLET | Freq: Two times a day (BID) | ORAL | Status: DC | PRN

## 2016-02-11 MED ORDER — FENTANYL CITRATE (PF) 100 MCG/2ML IJ SOLN
INTRAMUSCULAR | Status: AC
  Filled 2016-02-11: qty 2

## 2016-02-11 MED ORDER — MIDAZOLAM HCL 10 MG/2ML IJ SOLN
INTRAMUSCULAR | Status: DC | PRN
  Administered 2016-02-11: 2 mg via INTRAVENOUS
  Administered 2016-02-11: 1 mg via INTRAVENOUS
  Administered 2016-02-11: 2 mg via INTRAVENOUS

## 2016-02-11 MED ORDER — ASPIRIN 325 MG PO TBEC: 325.0000 mg | DELAYED_RELEASE_TABLET | Freq: Every day | ORAL | Status: AC

## 2016-02-11 MED ORDER — FENTANYL CITRATE (PF) 100 MCG/2ML IJ SOLN
INTRAMUSCULAR | Status: DC | PRN
  Administered 2016-02-11 (×2): 25 ug via INTRAVENOUS

## 2016-02-11 MED ORDER — FUROSEMIDE 40 MG PO TABS: 40.0000 mg | ORAL_TABLET | Freq: Every day | ORAL | Status: DC

## 2016-02-11 MED ORDER — BUTAMBEN-TETRACAINE-BENZOCAINE 2-2-14 % EX AERO
INHALATION_SPRAY | CUTANEOUS | Status: DC | PRN
  Administered 2016-02-11: 2 via TOPICAL

## 2016-02-11 MED ORDER — AMLODIPINE BESYLATE 5 MG PO TABS: 5.0000 mg | ORAL_TABLET | Freq: Every day | ORAL | Status: DC

## 2016-02-11 NOTE — Progress Notes (Signed)
Discussed with Dr. Oval Linsey - patient is OK for discharge. Continue current meds. Spoke with nurse coordinator Thurmond Butts for TCTS - they are aware of patient and will plan to schedule outpatient consultation office visit for surgery this upcoming week and any pre-op testing required. They will call him with this information. D/w IM and patient.  The patient said he just wanted to apologize to nursing staff for being difficult today. He says he is thankful for the excellent service he has received and was feeling overwhelmed. Rydan Gulyas PA-C

## 2016-02-11 NOTE — Progress Notes (Signed)
  Echocardiogram Echocardiogram Transesophageal has been performed.  Alexis [name] 02/11/2016, 12:09 PM

## 2016-02-11 NOTE — Progress Notes (Signed)
CARDIAC REHAB PHASE I   PRE:  Rate/Rhythm: 74 SR  BP:  Sitting: 167/47        SaO2: 95 RA  MODE:  Ambulation: 300 ft   POST:  Rate/Rhythm: 83 SR  BP:  Sitting: 181/50         SaO2: 97 RA  Pt upset, states he has received conflicting information regarding his mobility. Per RN, ok for pt to ambulate. Pt ambulated 300 ft on  RA, hand held assist, fairly steady gait, tolerated fairly well. Pt c/o increasing R groin pain with ambulation, 7/10. BP somewhat elevated. RN aware. Will wait to complete pre-op education pending TCTS decision regarding surgery. Pt to bed after walk per pt request, call bell within reach. Will follow.   [id number] Lenna Sciara, RN, BSN 02/11/2016 8:39 AM

## 2016-02-11 NOTE — Procedures (Signed)
Procedure: TEE  Sedation: Versed 5 mg IV, Fentanyl 50 mcg   Indication: Aortic insufficiency  Findings: Please see echo section for full report.  Mildly dilated LV with mild LV hypertrophy.  EF 35%, diffuse hypokinesis.  Normal RV size and systolic function.  Normal LA size with no LAA thrombus.  Normal RA size.  Trivial TR.  Trivial MR.  The aortic valve was trileaflet but leaflets appeared redundant. There was severe, eccentric aortic insufficiency.  No aortic stenosis.  Unfortunately, the TEE machine used does not have 3D.  The aortic root measured 3.6 cm and the ascending aorta was not dilated.  No PFO/ASD (negative bubble study).  There was holodiastolic flow reversal in the ascending aorta and partial diastolic flow reversal in the descending thoracic aorta.   Impression: Severe aortic insufficiency.   Alexis [name] 02/11/2016

## 2016-02-11 NOTE — H&P (View-Only) (Signed)
TEE scheduled tomorrow at 3pm. After careful review of history and examination, the risks and benefits of transesophageal echocardiogram have been explained including risks of esophageal damage, perforation (1:10,000 risk), bleeding, pharyngeal hematoma as well as other potential complications associated with conscious sedation including aspiration, arrhythmia, respiratory failure and death. Alternatives to treatment were discussed, questions were answered. Patient is willing to proceed.   Charlie Pitter, PA-C 02/10/2016 10:11 AM

## 2016-02-11 NOTE — Interval H&P Note (Signed)
History and Physical Interval Note:  02/11/2016 2:06 PM  Alexis [name]  has presented today for surgery, with the diagnosis of ACCESS AORTIC VALVE  The various methods of treatment have been discussed with the patient and family. After consideration of risks, benefits and other options for treatment, the patient has consented to  Procedure(s): TRANSESOPHAGEAL ECHOCARDIOGRAM (TEE) (N/A) as a surgical intervention .  The patient's history has been reviewed, patient examined, no change in status, stable for surgery.  I have reviewed the patient's chart and labs.  Questions were answered to the patient's satisfaction.     Dalton [employer]

## 2016-02-11 NOTE — Discharge Summary (Signed)
Physician Discharge Summary  Alexis [name] [id number] DOB: March 18, 1951 DOA: 02/05/2016  PCP: [name], MD  Admit date: 02/05/2016 Discharge date: 02/11/2016  Time spent: > 35 minutes  Recommendations for Outpatient Follow-up:  1. Pt has no new complaints no acute issues overnight. 2. F/u with Hep C viral load and further work up. Recommend f/u with GI   Discharge Diagnoses:  Principal Problem:   Acute on chronic systolic CHF (congestive heart failure) (HCC) Active Problems:   Chronic hepatitis C without hepatic coma (HCC)   Chronic cluster headache, not intractable   Loss of weight   Essential hypertension   Protein-calorie malnutrition, severe   Pulmonary hypertension (HCC)   History of CVA (cerebrovascular accident)   Hypokalemia   Marijuana abuse   Troponin level elevated   Aortic regurgitation   CAD in native artery   Postoperative groin pseudoaneurysm (HCC)   Severe aortic regurgitation   Discharge Condition: stable  Diet recommendation: heart healthy  Filed Weights   02/09/16 0517 02/10/16 0352 02/11/16 0452  Weight: 48.852 kg (107 lb 11.2 oz) 49 kg (108 lb 0.4 oz) 48.943 kg (107 lb 14.4 oz)    History of present illness:  65 y.o. male , married but separated, lives alone, independent of activities of daily living, PMH of HTN, chronic hepatitis C, cluster headache, remote former smoker, marijuana abuse, hospitalized 10/15/15-10/18/15 for acute respiratory failure felt to be secondary to acute bronchitis and acute systolic CHF, LVEF 123XX123 percent, acute CVA presented to [facility] ED on 02/05/16 with progressively worsening dyspnea. In ED, troponin 0.09, BNP >4500, chest x-ray without acute cardiopulmonary process, EKG with LVH. Admitted to SDU for Acute on chronic Systolic CHF and severe AI. Cardiology consulted. Status post right and left heart cath 02/09/16: Essentially normal coronaries except one notable lesion in small caliber OM branch (90% stenosis), euvolemic by RHC &  moderate to severe AR. TCTS consulted by cardiology for surgical evaluation. TEE performed on 6/7.  Hospital Course:  According to cardiology notes:  69M with recent CVA, hypertension, recently identified chronic systolic CHF (EF 123XX123 in 10/2015), severe aortic regurgitation, and mitral regurgitation, prior seizure, hepatitis C was admitted with acute on chronic systolic CHF, poorly controlled HTN and weight loss. CT abd/pelvis 10/2015 without intra-abdominal malignancy. F/u echo 02/06/16 showed EF 35-40%, mod-severe AI, mild MR, severely dilated LA, moderate pulm HTN. R/LHC 02/09/16: essentially normal cors except one notable lesion in small caliber OM branch (90% stenosis), appearing euvolemic by RHC.  1. Acute on chronic systolic CHF due to NICM: continue daily Lasix. Watch BUN/Cr.   2. Aortic valve regurgitation: s/p TEE Dr. Oval Linsey has discussed the case with Dr. [name] for consultation. Patient to f/u with Cardiothoracic surgeon  3. CAD: continue ASA, BB, statin.   4. Cachexia: unclear cause. CT abd pelvis neg for a malignancy, TSH normal, CTA 10/2015 without pulm malignancy. Blood cx pending. Hep C viral load resulted in Epic - 1.34mil. Recommend patient f/u with   5. HTN: amlodipine, lisinopril on d/c  6. Small right groin pseudoaneurysm: thrombosed per f/u US. Slight downtrend in Hgb. Monitor. He has PRN pain medication readily available if needed.  Procedures:  TEE  Consultations:  Cardiology  Cardiothoracic surgeon  Discharge Exam: Filed Vitals:   02/11/16 1510 02/11/16 1543  BP: 122/35 141/52  Pulse: 71 82  Temp:  98.5 F (36.9 C)  Resp: 16 19    General: Pt in nad, alert and awake Cardiovascular: rrr, no rubs Respiratory: no increased wob, no  wheezes  Discharge Instructions   Discharge Instructions    Call MD for:  difficulty breathing, headache or visual disturbances    Complete by:  As directed      Call MD for:  severe uncontrolled pain    Complete by:  As  directed      Call MD for:  temperature >100.4    Complete by:  As directed      Diet - low sodium heart healthy    Complete by:  As directed      Discharge instructions    Complete by:  As directed   Please be sure to follow up with Cardiology and Cardiothoracic surgeon     Increase activity slowly    Complete by:  As directed           Current Discharge Medication List    START taking these medications   Details  amLODipine (NORVASC) 5 MG tablet Take 1 tablet (5 mg total) by mouth daily. Qty: 30 tablet, Refills: 0    carvedilol (COREG) 6.25 MG tablet Take 1 tablet (6.25 mg total) by mouth 2 (two) times daily with a meal. Qty: 60 tablet, Refills: 0      CONTINUE these medications which have CHANGED   Details  aspirin EC 325 MG EC tablet Take 1 tablet (325 mg total) by mouth daily. Qty: 30 tablet, Refills: 0    furosemide (LASIX) 40 MG tablet Take 1 tablet (40 mg total) by mouth daily. Qty: 30 tablet, Refills: 0      CONTINUE these medications which have NOT CHANGED   Details  albuterol (PROVENTIL HFA;VENTOLIN HFA) 108 (90 Base) MCG/ACT inhaler Inhale 1 puff into the lungs every 6 (six) hours as needed for wheezing or shortness of breath.    atorvastatin (LIPITOR) 10 MG tablet Take 1 tablet (10 mg total) by mouth daily at 6 PM. Qty: 30 tablet, Refills: 0    feeding supplement, ENSURE ENLIVE, (ENSURE ENLIVE) LIQD Take 237 mLs by mouth 3 (three) times daily between meals. Qty: 90 Bottle, Refills: 0    lisinopril (PRINIVIL,ZESTRIL) 40 MG tablet Take 1 tablet (40 mg total) by mouth daily. Qty: 30 tablet, Refills: 0       No Active Allergies Follow-up Information    Follow up with Almyra Deforest, PA.   Specialties:  Cardiology, Radiology   Why:  [facility] - 02/20/16 at 9:30am. Isaac Laud is one of the PAs with our cardiology group that will see you for your first post-hospital visit.   Contact information:   [address] [place] [address] [phone]        Follow up with [name], MD.   Specialty:  Cardiothoracic Surgery   Why:  The cardiothoracic surgery office will be calling you to schedule your appointment to discuss possible surgery.   Contact information:   [address] [place] [address] [phone]        The results of significant diagnostics from this hospitalization (including imaging, microbiology, ancillary and laboratory) are listed below for reference.    Significant Diagnostic Studies: Dg Chest 2 View  02/05/2016  CLINICAL DATA:  Patient with shortness of breath, worsening for multiple months. EXAM: CHEST  2 VIEW COMPARISON:  Chest CT 10/15/2015; chest radiograph 10/15/2015. FINDINGS: Stable cardiac no consolidative pulmonary opacities. No pleural effusion or pneumothorax. Postsurgical changes proximal right humerus. Thoracic spine degenerative changes. IMPRESSION: No acute cardiopulmonary process. Electronically Signed   By: Dian Situ  Rosana Hoes M.D.   On: 02/05/2016 13:13    Microbiology: Recent Results (from the past 240 hour(s))  MRSA PCR Screening     Status: None   Collection Time: 02/05/16  6:41 PM  Result Value Ref Range Status   MRSA by PCR NEGATIVE NEGATIVE Final    Comment:        The GeneXpert MRSA Assay (FDA approved for NASAL specimens only), is one component of a comprehensive MRSA colonization surveillance program. It is not intended to diagnose MRSA infection nor to guide or monitor treatment for MRSA infections.   Culture, blood (routine x 2)     Status: None (Preliminary result)   Collection Time: 02/09/16  7:15 PM  Result Value Ref Range Status   Specimen Description BLOOD LEFT ARM  Final   Special Requests IN PEDIATRIC BOTTLE 3CC  Final   Culture NO GROWTH < 24 HOURS  Final   Report Status PENDING  Incomplete  Culture, blood (routine x 2)     Status: None (Preliminary result)   Collection Time: 02/09/16  7:22 PM  Result Value Ref Range Status   Specimen Description  BLOOD LEFT HAND  Final   Special Requests IN PEDIATRIC BOTTLE 3CC  Final   Culture NO GROWTH < 24 HOURS  Final   Report Status PENDING  Incomplete     Labs: Basic Metabolic Panel:  Recent Labs Lab 02/06/16 1115 02/07/16 0206 02/08/16 0306 02/09/16 0543 02/11/16 0432  NA 140 142 138 137 136  K 3.4* 3.8 3.8 4.2 5.1  CL 104 98* 98* 98* 95*  CO2 27 33* 30 28 28   GLUCOSE 108* 99 81 100* 97  BUN 16 23* 22* 21* 24*  CREATININE 1.00 0.91 0.77 0.86 0.94  CALCIUM 9.6 10.2 9.6 9.6 9.2   Liver Function Tests:  Recent Labs Lab 02/11/16 0432  AST 51*  ALT 25  ALKPHOS 70  BILITOT 0.9  PROT 6.2*  ALBUMIN 3.5   No results for input(s): LIPASE, AMYLASE in the last 168 hours. No results for input(s): AMMONIA in the last 168 hours. CBC:  Recent Labs Lab 02/05/16 1244 02/09/16 0543 02/09/16 1926 02/11/16 0432  WBC 7.3 6.1 4.2 7.5  HGB 13.9 14.4 13.3 12.1*  HCT 41.2 42.9 39.3 37.0*  MCV 87.8 88.8 87.3 89.8  PLT 190 207 226 218   Cardiac Enzymes:  Recent Labs Lab 02/05/16 1244 02/06/16 0215 02/06/16 0824 02/06/16 1433  TROPONINI 0.09* 0.14* 0.12* 0.09*   BNP: BNP (last 3 results)  Recent Labs  10/15/15 1420 02/05/16 1421  BNP 2457.0* >4500.0*    ProBNP (last 3 results) No results for input(s): PROBNP in the last 8760 hours.  CBG: No results for input(s): GLUCAP in the last 168 hours.     Signed:  Velvet Bathe MD.  [facility] 02/11/2016, 4:36 PM

## 2016-02-11 NOTE — Progress Notes (Signed)
Patient: Alexis [name] / Admit Date: 02/05/2016 / Date of Encounter: 02/11/2016, 8:00 AM   Subjective: Continues to have intermittent pain in right groin. Unhappy with his pain  Eager to know when he can go home. He is aware his workup is still in progress. No SOB this AM.   Objective: Telemetry: NSR Physical Exam: Blood pressure 165/57, pulse 68, temperature 98 F (36.7 C), temperature source Oral, resp. rate 28, height 5\' 4"  (1.626 m), weight 107 lb 14.4 oz (48.943 kg), SpO2 97 %. General: Well developed thin cachectic appearing AAM, in no acute distress. Head: Normocephalic, atraumatic, sclera non-icteric, no xanthomas, nares are without discharge. Neck: Negative for carotid bruits. JVP not elevated. Lungs: Clear bilaterally to auscultation without wheezes, rales, or rhonchi. Breathing is unlabored. Heart: RRR S1 S2 + diastolic murmur. Abdomen: Soft, non-tender, non-distended with normoactive bowel sounds. No rebound/guarding. Extremities: No clubbing or cyanosis. No edema. Distal pedal pulses are 2+ and equal bilaterally. Right groin with softened hematoma, no obvious bruit auscultated Neuro: Alert and oriented X 3. Moves all extremities spontaneously. Psych: Responds to questions appropriately with a normal affect.   Intake/Output Summary (Last 24 hours) at 02/11/16 0800 Last data filed at 02/11/16 0458  Gross per 24 hour  Intake    720 ml  Output    825 ml  Net   -105 ml    Inpatient Medications:  . amLODipine  5 mg Oral Daily  . angioplasty book   Does not apply Once  . aspirin EC  325 mg Oral Daily  . atorvastatin  10 mg Oral q1800  . carvedilol  6.25 mg Oral BID WC  . enoxaparin (LOVENOX) injection  40 mg Subcutaneous Q24H  . feeding supplement  1 Container Oral TID BM  . feeding supplement (ENSURE ENLIVE)  237 mL Oral TID BM  . furosemide  40 mg Oral Daily  . lisinopril  40 mg Oral Daily  . sodium chloride flush  3 mL Intravenous Q12H  . sodium chloride flush  3 mL  Intravenous Q12H   Infusions:    Labs:  Recent Labs  02/09/16 0543 02/11/16 0432  NA 137 136  K 4.2 5.1  CL 98* 95*  CO2 28 28  GLUCOSE 100* 97  BUN 21* 24*  CREATININE 0.86 0.94  CALCIUM 9.6 9.2    Recent Labs  02/11/16 0432  AST 51*  ALT 25  ALKPHOS 70  BILITOT 0.9  PROT 6.2*  ALBUMIN 3.5    Recent Labs  02/09/16 1926 02/11/16 0432  WBC 4.2 7.5  HGB 13.3 12.1*  HCT 39.3 37.0*  MCV 87.3 89.8  PLT 226 218   No results for input(s): CKTOTAL, CKMB, TROPONINI in the last 72 hours. Invalid input(s): POCBNP No results for input(s): HGBA1C in the last 72 hours.   Radiology/Studies:  Dg Chest 2 View  02/05/2016  CLINICAL DATA:  Patient with shortness of breath, worsening for multiple months. EXAM: CHEST  2 VIEW COMPARISON:  Chest CT 10/15/2015; chest radiograph 10/15/2015. FINDINGS: Stable cardiac no consolidative pulmonary opacities. No pleural effusion or pneumothorax. Postsurgical changes proximal right humerus. Thoracic spine degenerative changes. IMPRESSION: No acute cardiopulmonary process. Electronically Signed   By: Lovey Newcomer M.D.   On: 02/05/2016 13:13     Assessment and Plan  5M with recent CVA, hypertension, recently identified chronic systolic CHF (EF 123XX123 in 10/2015), severe aortic regurgitation, and mitral regurgitation, prior seizure, hepatitis C was admitted with acute on chronic systolic CHF, poorly controlled HTN  and weight loss. CT abd/pelvis 10/2015 without intra-abdominal malignancy. F/u echo 02/06/16 showed EF 35-40%, mod-severe AI, mild MR, severely dilated LA, moderate pulm HTN. R/LHC 02/09/16: essentially normal cors except one notable lesion in small caliber OM branch (90% stenosis), appearing euvolemic by RHC.  1. Acute on chronic systolic CHF due to NICM: continue daily Lasix. Watch BUN/Cr. If BUN continues to rise may need to scale back Lasix to 20mg  daily.  2. Aortic valve regurgitation: For TEE today. Dr. Oval Linsey has discussed the case with  Dr. [name] for consultation.  3. CAD: continue ASA, BB, statin.   4. Cachexia: unclear cause. CT abd pelvis neg for a malignancy, TSH normal, CTA 10/2015 without pulm malignancy. Blood cx pending. Hep C viral load resulted in Epic - 1.71mil. Appreciate IM input regarding this.  5. HTN: BP improved last night to the 130s/50s on higher dose of carvedilol. Will follow BP today before making additional changes.  6. Small right groin pseudoaneurysm: thrombosed per f/u US. Slight downtrend in Hgb. Monitor. He has PRN pain medication readily available if needed.  Signed, Melina Copa PA-C Pager: [phone]

## 2016-02-11 NOTE — Telephone Encounter (Signed)
New message      TCM appt on 02-20-16 with Almyra Deforest, per [facility]

## 2016-02-12 ENCOUNTER — Encounter (HOSPITAL_COMMUNITY): Payer: Self-pay | Admitting: Cardiology

## 2016-02-12 NOTE — Telephone Encounter (Signed)
TCM call attempt #1 - LMTCB 

## 2016-02-13 ENCOUNTER — Encounter: Payer: Medicare Other | Admitting: Thoracic Surgery (Cardiothoracic Vascular Surgery)

## 2016-02-13 NOTE — Telephone Encounter (Signed)
TCM call attempt # 2 - LM w/appt date, time, location.

## 2016-02-14 LAB — CULTURE, BLOOD (ROUTINE X 2)
Culture: NO GROWTH
Culture: NO GROWTH

## 2016-02-16 ENCOUNTER — Encounter: Payer: Self-pay | Admitting: Physician Assistant

## 2016-02-16 ENCOUNTER — Encounter (HOSPITAL_COMMUNITY): Payer: Medicare Other

## 2016-02-17 ENCOUNTER — Institutional Professional Consult (permissible substitution) (INDEPENDENT_AMBULATORY_CARE_PROVIDER_SITE_OTHER): Payer: Medicare HMO | Admitting: Thoracic Surgery (Cardiothoracic Vascular Surgery)

## 2016-02-17 ENCOUNTER — Encounter: Payer: Self-pay | Admitting: Thoracic Surgery (Cardiothoracic Vascular Surgery)

## 2016-02-17 ENCOUNTER — Other Ambulatory Visit: Payer: Self-pay | Admitting: *Deleted

## 2016-02-17 VITALS — BP 155/70 | HR 76 | Resp 19 | Ht 64.0 in | Wt 121.0 lb

## 2016-02-17 DIAGNOSIS — I272 Other secondary pulmonary hypertension: Secondary | ICD-10-CM

## 2016-02-17 DIAGNOSIS — I2510 Atherosclerotic heart disease of native coronary artery without angina pectoris: Secondary | ICD-10-CM

## 2016-02-17 DIAGNOSIS — I071 Rheumatic tricuspid insufficiency: Secondary | ICD-10-CM

## 2016-02-17 DIAGNOSIS — I5023 Acute on chronic systolic (congestive) heart failure: Secondary | ICD-10-CM

## 2016-02-17 DIAGNOSIS — I351 Nonrheumatic aortic (valve) insufficiency: Secondary | ICD-10-CM

## 2016-02-17 DIAGNOSIS — I639 Cerebral infarction, unspecified: Secondary | ICD-10-CM | POA: Diagnosis not present

## 2016-02-17 NOTE — Patient Instructions (Signed)
Continue all previous medications without any changes at this time  Follow up with your cardiologist as planned  Schedule appointment in [facility] ASAP  Monitor your weight closely and looks for signs of fluid retention, such as swelling around your ankles and/or shortness of breath.  Contact your cardiologist ASAP if any of those signs develop

## 2016-02-17 NOTE — Progress Notes (Signed)
[address]       [place] [address]             [place] REPORT  Referring Provider is [name], MD  Primary Cardiologist is Stanford Breed [name], MD PCP is [name], MD  Chief Complaint  Patient presents with  . Aortic Insuffiency    severe AI, eval for AVR, review TEE    HPI:  Patient is a 64 year old African-American male with history of hypertension, hepatitis C, seizure disorder, and recent stroke who has been referred for surgical consultation to discuss treatment options for management of severe symptomatic aortic insufficiency with acute combined systolic and diastolic congestive heart failure. The patient is a service connected veteran and has been followed through the [place] medical system for many years. He is not certain about any previous diagnosis of heart valve disease, but he thinks that his primary care physician may have told him that he had a heart murmur in the past. He had never been formally evaluated by a cardiologist until recently.  The patient presented acutely for hospital admission on 10/15/2015 with severe resting shortness of breath, cough, chest pain, headache, nausea, vomiting, and diarrhea. He described a 3 month history of anorexia and associated 35 pounds weight loss. He reports history of low-grade fevers and night sweats. Part to his hospital admission he had been treated as an outpatient with oral antibiotics for a presumed urinary tract infection. The patient was diagnosed with acute hypoxic respiratory failure secondary to acute congestive heart failure with possible bronchitis. BNP level was elevated 2457. CT angiogram of the chest reveals an artifact in the left lower lobe pulmonary artery that was felt to be an artifact and not consistent with acute pulmonary embolus. There was cardiac enlargement and pulmonary edema. Transthoracic echocardiogram revealed severe aortic  insufficiency with moderate global left ventricular systolic dysfunction, ejection fraction estimated 40-45%, severe pulmonary hypertension and moderate tricuspid regurgitation. Cardiology was not consulted. The patient's shortness of breath improved with medical therapy including antibiotics, bronchodilators, lisinopril, and Lasix.  MRI of the brain was performed at the time of the patient's initial presentation in notable for a small acute infarct in the right frontal lobe. The patient had reportedly experienced a transient episode of leg weakness that had resolved. The patient also states he had some difficulty with speech.  The patient was evaluated by neurology and a thorough search for possible source of embolization was recommended. CT angiogram of the head and neck was unrevealing.  A transesophageal echocardiogram was not performed. Blood cultures were not obtained. The patient was discharged from the hospital on oral azithromycin for presumed bronchitis.  The patient was readmitted to the hospital 02/05/2016 with severe exertional shortness of breath, orthopnea, and PND.  BNP level was greater than 4500. Follow-up transthoracic echocardiogram revealed severe aortic insufficiency.  There was at least moderate left ventricular systolic dysfunction with ejection fraction estimated 35-40%. The patient was evaluated by cardiology and treated with intravenous Lasix with good response. He underwent left and right heart catheterization 02/09/2016 revealing essentially normal coronary arteries with 90% stenosis of a very small-caliber third obtuse marginal branch of the left circumflex and otherwise minimal nonobstructive disease. There was moderate to severe left ventricular systolic dysfunction with severe aortic insufficiency.  There was mild pulmonary hypertension. The patient developed right groin pseudoaneurysm following that required ultrasound-guided thrombosis. Transesophageal echocardiogram was  performed prior to hospital discharge  and confirmed the presence of severe aortic insufficiency. No obvious vegetations were seen.  The patient diuresed more than 20 pounds of fluid and experienced significant clinical improvement. He was discharged from the hospital with plans for elective cardiothoracic surgical consultation as an outpatient.  The patient is married but has been separated from his wife for several years. He lives alone in [place]. He has 3 adult children and several grandchildren. 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 He has been retired for approximately 5 years. Prior to becoming ill the patient states that he traveled fairly regularly, primarily to visit his family. He describes a 4-5 months history of anorexia, weight loss, low-grade fever, night sweats, and progressive shortness of breath. He has not had chest pain or chest tightness. He has not had dizzy spells or syncope. His breathing has been much better since his recent hospital discharge although he has gained some weight over the past week. He denies any productive cough. Orthopnea has improved. He does not have lower extremity edema. He still gets night sweats. He denies migratory arthritis or arthralgias.  Past Medical History  Diagnosis Date  . Hepatitis C   . Hypertension   . Headache   . Chronic systolic CHF (congestive heart failure) ([place])   . Severe aortic regurgitation   . Mitral regurgitation   . Tricuspid regurgitation   . Stroke (cerebrum) ([place]) 10/2015  . Protein calorie malnutrition ([place] [facility])   . Nephrolithiasis   . Seizure [facility])     Past Surgical History  Procedure Laterality Date  . Fracture surgery    . Rotator cuff repair    . Cardiac catheterization N/A 02/09/2016    Procedure: Right/Left Heart Cath and Coronary Angiography;  Surgeon: [name], MD;  Location: [facility];  Service: Cardiovascular;  Laterality: N/A;  . Tee without cardioversion N/A 02/11/2016      Procedure: TRANSESOPHAGEAL ECHOCARDIOGRAM (TEE);  Surgeon: [name], MD;  Location: [facility] ENDOSCOPY;  Service: Cardiovascular;  Laterality: N/A;    Family History  Problem Relation Age of Onset  . Breast cancer Mother   . Aneurysm Mother     Brain aneurysm  . Hypertension Father   . Cancer Father     Pancreatic cancer  . Hypertension Brother     Social History   Social History  . Marital Status: Married    Spouse Name: N/A  . Number of Children: 3  . Years of Education: N/A   Occupational History  . Not on file.   Social History Main Topics  . Smoking status: Former Research scientist (life sciences)  . Smokeless tobacco: Never Used  . Alcohol Use: 0.0 oz/week    0 Standard drinks or equivalent per week     Comment: occ  . Drug Use: Yes    Special: Marijuana     Comment: THC  . Sexual Activity: Not on file   Other Topics Concern  . Not on file   Social History Narrative    Current Outpatient Prescriptions  Medication Sig Dispense Refill  . aspirin EC 325 MG EC tablet Take 1 tablet (325 mg total) by mouth daily. 30 tablet 0  . atorvastatin (LIPITOR) 10 MG tablet Take 1 tablet (10 mg total) by mouth daily at 6 PM. 30 tablet 0  . feeding supplement, ENSURE ENLIVE, (ENSURE ENLIVE) LIQD Take 237 mLs by mouth 3 (three) times daily between meals. 90 Bottle 0  . furosemide (LASIX) 40 MG tablet Take 1  tablet (40 mg total) by mouth daily. 30 tablet 0  . lisinopril (PRINIVIL,ZESTRIL) 40 MG tablet Take 1 tablet (40 mg total) by mouth daily. 30 tablet 0   No current facility-administered medications for this visit.    No Active Allergies    Review of Systems:   General:  decreased appetite, decreased energy, + weight gain, + weight loss, + low grade fever  Cardiac:  no chest pain with exertion, no chest pain at rest, +SOB with exertion, + resting SOB, + PND, + orthopnea, no palpitations, no arrhythmia, no atrial fibrillation, no LE edema, no dizzy spells, no syncope  Respiratory:  +  shortness of breath, no home oxygen, no productive cough, + chronic dry cough, + possible bronchitis, no wheezing, no hemoptysis, no asthma, no pain with inspiration or cough, no sleep apnea, no CPAP at night  GI:   occasional difficulty swallowing, no reflux, + frequent heartburn, no hiatal hernia, no abdominal pain, no constipation, no diarrhea, no hematochezia, no hematemesis, no melena  GU:   no dysuria,  no frequency, no urinary tract infection, no hematuria, no enlarged prostate, no kidney stones, no kidney disease  Vascular:  no pain suggestive of claudication, no pain in feet, no leg cramps, no varicose veins, no DVT, no non-healing foot ulcer  Neuro:   + recent stroke, no TIA's, + seizures, + headaches, no temporary blindness one eye,   + slurred speech, no peripheral neuropathy, + chronic pain, no instability of gait, no memory/cognitive dysfunction  Musculoskeletal: no arthritis, no joint swelling, no myalgias, no difficulty walking, normal mobility   Skin:   + rash, no itching, no skin infections, no pressure sores or ulcerations  Psych:   + anxiety, + depression, no nervousness, no unusual recent stress  Eyes:   + blurry vision, no floaters, no recent vision changes, + wears glasses or contacts  ENT:   no hearing loss, no loose or painful teeth, no dentures, last saw dentist many years ago  Hematologic:  no easy bruising, no abnormal bleeding, no clotting disorder, + frequent epistaxis  Endocrine:  no diabetes, does not check CBG's at home     Physical Exam:   BP 155/70 mmHg  Pulse 76  Resp 19  Ht 5' 4"  (1.626 m)  Wt 121 lb (54.885 kg)  BMI 20.76 kg/m2  SpO2 98%  General:  Thin, well-appearing  HEENT:  Unremarkable   Neck:   no JVD, no bruits, no adenopathy   Chest:   clear to auscultation, symmetrical breath sounds, no wheezes, no rhonchi   CV:   RRR, grade IV/VI holodiastolic murmur with water hammer pulse  Abdomen:  soft, non-tender, no masses   Extremities:  warm,  well-perfused, pulses diminished but palpable, no LE edema  Rectal/GU  Deferred  Neuro:   Grossly non-focal and symmetrical throughout  Skin:   Clean and dry, no rashes, no breakdown   Diagnostic Tests:  Transthoracic Echocardiography  Patient: Alexis [name], Alexis [name] MR #: [id number] Study Date: 10/16/2015 Gender: M Age: 59 Height: 162.6 cm Weight: 53.1 kg BSA: 1.55 m^2 Pt. Status: Room: 3E21C  SONOGRAPHER Lavella Lemons [id number] ATTENDING Billy Fischer, Erin PERFORMING [facility], Inpatient  cc:  ------------------------------------------------------------------- LV EF: 40% - 45%  ------------------------------------------------------------------- Indications: CHF - 428.0.  ------------------------------------------------------------------- History: PMH: Hepatits C. Headache. Transient ischemic attack. Risk factors: Hypertension.  ------------------------------------------------------------------- Study Conclusions  - Left ventricle: The cavity size was normal. There was moderate  concentric hypertrophy. Systolic function was mildly to  moderately reduced.  The estimated ejection fraction was in the  range of 40% to 45%. Diffuse hypokinesis. - Aortic valve: Trileaflet; mildly thickened, mildly calcified  leaflets. There was severe regurgitation. - Mitral valve: There was moderate regurgitation directed  posteriorly. - Right ventricle: The cavity size was mildly dilated. Wall  thickness was normal. - Right atrium: The atrium was moderately dilated. - Tricuspid valve: There was moderate regurgitation. - Pulmonary arteries: Systolic pressure was severely increased. PA  peak pressure: 81 mm Hg (S).  Transthoracic echocardiography. M-mode, complete 2D, spectral Doppler, and color Doppler. Birthdate: Patient birthdate: 09-26-1950. Age: Patient is 65 yr old. Sex: Gender:  male. BMI: 20.1 kg/m^2. Blood pressure: 148/56 Patient status: Inpatient. Study date: Study date: 10/16/2015. Study time: 02:39 PM. Location: Bedside.  -------------------------------------------------------------------  ------------------------------------------------------------------- Left ventricle: The cavity size was normal. There was moderate concentric hypertrophy. Systolic function was mildly to moderately reduced. The estimated ejection fraction was in the range of 40% to 45%. Diffuse hypokinesis.  ------------------------------------------------------------------- Aortic valve: Trileaflet; mildly thickened, mildly calcified leaflets. Mobility was not restricted. Doppler: Transvalvular velocity was within the normal range. There was no stenosis. There was severe regurgitation.  ------------------------------------------------------------------- Aorta: Aortic root: The aortic root was normal in size.  ------------------------------------------------------------------- Mitral valve: Mildly thickened leaflets . Mobility was not restricted. Doppler: Transvalvular velocity was within the normal range. There was no evidence for stenosis. There was moderate regurgitation directed posteriorly.  ------------------------------------------------------------------- Left atrium: The atrium was normal in size.  ------------------------------------------------------------------- Right ventricle: The cavity size was mildly dilated. Wall thickness was normal. Systolic function was normal.  ------------------------------------------------------------------- Pulmonic valve: Poorly visualized. Structurally normal valve. Cusp separation was normal. Doppler: Transvalvular velocity was within the normal range. There was no evidence for stenosis. There was no regurgitation.  ------------------------------------------------------------------- Tricuspid  valve: Structurally normal valve. Doppler: Transvalvular velocity was within the normal range. There was moderate regurgitation.  ------------------------------------------------------------------- Pulmonary artery: The main pulmonary artery was normal-sized. Systolic pressure was severely increased.  ------------------------------------------------------------------- Right atrium: The atrium was moderately dilated.  ------------------------------------------------------------------- Pericardium: There was no pericardial effusion.  ------------------------------------------------------------------- Systemic veins: Inferior vena cava: The vessel was dilated. The respirophasic diameter changes were blunted (< 50%), consistent with elevated central venous pressure.  ------------------------------------------------------------------- Measurements  Left ventricle Value Reference LV ID, ED, PLAX chordal (H) 57.9 mm 43 - 52 LV ID, ES, PLAX chordal (H) 44.5 mm 23 - 38 LV fx shortening, PLAX chordal (L) 23 % >=29 LV PW thickness, ED 12.7 mm --------- IVS/LV PW ratio, ED 1.18 <=1.3 LV e&', lateral 8.81 cm/s --------- LV e&', medial 9.79 cm/s --------- LV e&', average 9.3 cm/s ---------  Ventricular septum Value Reference IVS thickness, ED 15 mm ---------  LVOT Value Reference LVOT ID, S 25 mm --------- LVOT area 4.91 cm^2 ---------  Aortic valve Value Reference Aortic regurg  peak velocity 173 cm/s --------- Aortic regurg pressure half-time 229 ms --------- Aortic regurg peak gradient 12 mm Hg ---------  Aorta Value Reference Aortic root ID, ED 34 mm ---------  Left atrium Value Reference LA ID, A-P, ES 36 mm --------- LA ID/bsa, A-P (H) 2.33 cm/m^2 <=2.2 LA volume, S 137 ml --------- LA volume/bsa, S 88.6 ml/m^2 --------- LA volume, ES, 1-p A4C 125 ml --------- LA volume/bsa, ES, 1-p A4C 80.9 ml/m^2 --------- LA volume, ES, 1-p A2C 140 ml --------- LA volume/bsa, ES, 1-p A2C 90.6 ml/m^2 ---------  Mitral valve Value Reference Mitral maximal regurg velocity, 528 cm/s --------- PISA Mitral regurg VTI, PISA 162 cm --------- Mitral ERO, PISA 0.11 cm^2 ---------  Mitral regurg volume, PISA 18 ml ---------  Pulmonary arteries Value Reference PA pressure, S, DP (H) 81 mm Hg <=30  Tricuspid valve Value Reference Tricuspid regurg peak velocity 406 cm/s --------- Tricuspid peak RV-RA gradient 66 mm Hg ---------  Systemic veins Value Reference Estimated CVP 15 mm Hg ---------  Right ventricle Value Reference RV pressure, S, DP (H) 81 mm Hg <=30 RV s&',  lateral, S 17.1 cm/s ---------  Legend: (L) and (H) mark values outside specified reference range.  ------------------------------------------------------------------- Prepared and Electronically Authenticated by  Candee Furbish, M.D. 2017-02-09T15:34:42   Transthoracic Echocardiography  Patient: Alexis [name], Alexis [name] MR #: [id number] Study Date: 02/06/2016 Gender: M Age: 33 Height: 162.6 cm Weight: 48.4 kg BSA: 1.47 m^2 Pt. Status: Room: 2H27C  ADMITTING [place], Everlene Farrier D ATTENDING [place], Anand D ORDERING [place], [place] D REFERRING [place], [place] D PERFORMING [facility], Inpatient SONOGRAPHER Madelin Rear, RDCS  cc:  ------------------------------------------------------------------- LV EF: 35% - 40%  ------------------------------------------------------------------- Indications: CHF - 428.0.  ------------------------------------------------------------------- History: PMH: Mitral valve disease. Aortic valve disease. Risk factors: Hypertension.  ------------------------------------------------------------------- Study Conclusions  - Left ventricle: The cavity size was normal. Wall thickness was  normal. Systolic function was moderately reduced. The estimated  ejection fraction was in the range of 35% to 40%. Diffuse  hypokinesis. There was fusion of early and atrial contributions  to ventricular filling. The study is not technically sufficient  to allow evaluation of LV diastolic function. - Aortic valve: Moderately calcified annulus. Mild diffuse  calcification. There was moderate to severe regurgitation. - Mitral valve: There was mild regurgitation. - Left atrium: The atrium was severely dilated. - Atrial septum: No defect or patent foramen ovale was identified. - Pulmonary arteries: PA peak pressure: 45 mm Hg (S).  Impressions:  - The  right ventricular systolic pressure was increased consistent  with moderate pulmonary hypertension.  Transthoracic echocardiography. M-mode, complete 2D, spectral Doppler, and color Doppler. Birthdate: Patient birthdate: 12/08/1950. Age: Patient is 65 yr old. Sex: Gender: male. BMI: 18.3 kg/m^2. Blood pressure: 151/56 Patient status: Inpatient. Study date: Study date: 02/06/2016. Study time: 11:34 AM. Location: ICU/CCU  -------------------------------------------------------------------  ------------------------------------------------------------------- Left ventricle: The cavity size was normal. Wall thickness was normal. Systolic function was moderately reduced. The estimated ejection fraction was in the range of 35% to 40%. Diffuse hypokinesis. There was fusion of early and atrial contributions to ventricular filling. The study is not technically sufficient to allow evaluation of LV diastolic function.  ------------------------------------------------------------------- Aortic valve: Moderately calcified annulus. Mild diffuse calcification. Doppler: There was moderate to severe regurgitation.  ------------------------------------------------------------------- Mitral valve: Structurally normal valve. Leaflet separation was normal. Doppler: Transvalvular velocity was within the normal range. There was no evidence for stenosis. There was mild regurgitation.  ------------------------------------------------------------------- Left atrium: The atrium was severely dilated.  ------------------------------------------------------------------- Atrial septum: No defect or patent foramen ovale was identified.  ------------------------------------------------------------------- Right ventricle: The cavity size was normal. Wall thickness was normal. Systolic function was  normal.  ------------------------------------------------------------------- Pulmonic valve: Structurally normal valve. Cusp separation was normal. Doppler: Transvalvular velocity was within the normal range. There was no regurgitation.  ------------------------------------------------------------------- Tricuspid valve: Structurally normal valve. Leaflet separation was normal. Doppler: Transvalvular velocity was within the normal range. There was mild regurgitation.  ------------------------------------------------------------------- Right atrium: The atrium was normal in size.  ------------------------------------------------------------------- Measurements  Left ventricle Value Reference LV ID, ED, PLAX chordal (H) 55.7 mm 43 - 52 LV ID, ES, PLAX chordal (H) 44.9 mm 23 - 38 LV fx shortening, PLAX chordal (L) 19 % >=29 LV PW thickness,  ED 8.13 mm --------- IVS/LV PW ratio, ED 1.08 <=1.3 LV ejection fraction, 1-p A4C 22 % --------- LV end-diastolic volume, 2-p 272 ml --------- LV end-systolic volume, 2-p 536 ml --------- LV ejection fraction, 2-p 23 % --------- Stroke volume, 2-p 58 ml --------- LV end-diastolic volume/bsa, 2-p 644 ml/m^2 --------- LV end-systolic volume/bsa, 2-p 034 ml/m^2 --------- Stroke volume/bsa, 2-p 39.4 ml/m^2 --------- LV e&', lateral 4.13 cm/s ---------  Ventricular septum Value Reference IVS thickness, ED 8.74 mm ---------  LVOT Value Reference LVOT ID, S 21 mm  --------- LVOT area 3.46 cm^2 ---------  Aortic valve Value Reference Aortic regurg pressure half-time 298 ms ---------  Aorta Value Reference Aortic root ID, ED 34 mm ---------  Left atrium Value Reference LA ID, A-P, ES 31 mm --------- LA ID/bsa, A-P 2.11 cm/m^2 <=2.2 LA volume, S 102 ml --------- LA volume/bsa, S 69.3 ml/m^2 --------- LA volume, ES, 1-p A4C 97.6 ml --------- LA volume/bsa, ES, 1-p A4C 66.3 ml/m^2 --------- LA volume, ES, 1-p A2C 99.1 ml --------- LA volume/bsa, ES, 1-p A2C 67.3 ml/m^2 ---------  Mitral valve Value Reference Mitral regurg VTI, PISA 198 cm --------- Mitral ERO, PISA 0.16 cm^2 --------- Mitral regurg volume, PISA 32 ml ---------  Pulmonary arteries Value Reference PA pressure, S, DP (H) 45 mm Hg <=30  Systemic veins Value Reference Estimated CVP 5 mm Hg ---------  Right ventricle Value Reference TAPSE 27 mm --------- RV s&', lateral, S 11.5 cm/s ---------  Legend: (L) and (H) mark values outside specified reference range.  ------------------------------------------------------------------- Prepared and Electronically Authenticated by  Fransico Him, MD 2017-06-02T15:27:21    CARDIAC  CATHETERIZATION Procedures    Right/Left Heart Cath and Coronary Angiography    Conclusion     Essentially normal coronary arteries with only one notable lesion in a very small caliber third OM branch with 90% stenosis  There is moderate to severe left ventricular systolic dysfunction and moderate to severe aortic regurgitation by echocardiogram. Follow-up echocardiogram this coming weekend or later on this week.   Thankfully, would appear that the patient's symptoms dyspnea and chest discomfort are not likely the result of significant coronary disease.  Continued evaluation of aortic insufficiency. He appears to be euvolemic.  Plan: Return to nursing unit for post catheterization care and TR band removal.  He appears to be adequately diuresed.  Defer further evaluation to cardiology consult primary service.    Glenetta Hew, M.D., M.S. Interventional Cardiologist   Pager # [phone] Phone # [phone] [address] [place], [place] [address]      Indications    Congestive dilated cardiomyopathy (HCC) [I42.0 (ICD-10-CM)]   Aortic regurgitation [I35.1 (ICD-10-CM)]    Technique and Indications    PCP: [name], MD REFERRING MD: [place] CARDIOLOGIST: New - Dr. Stanford Breed.  65 year old male with past medical history of recent CVA, hypertension, hepatitis C for evaluation of congestive heart failure. Patient was admitted in February 2017 with complaints of dyspnea. He was also noted to have weight loss. His BNP was elevated. Chest CT showed question filling defect felt likely artifact. There were small bilateral pleural effusions. He was also found to have a CVA by MRI. Echocardiogram revealed ejection fraction 40- 45%, moderate left ventricular hypertrophy, severe aortic insufficiency, biatrial enlargement, moderate tricuspid regurgitation and severely elevated pulmonary pressures. He was treated with antibiotics, bronchodilators, lisinopril and  Lasix. Patient has chronic dyspnea on exertion. However over the last several months this has worsened. He also describes orthopnea and PND. He denies pedal edema. He  has had occasional bouts of chest pain for years not exertional. He does not have exertional chest pain. No syncope. No fevers but he has had subjective chills. He was seenby primary care and cardiology was asked to evaluate.  As part of his evaluation for aortic insufficiency, he is referred for right left heart catheterization.   PROCEDURE Estimated blood loss <50 mL. There were no immediate complications during the procedure.  Time Out: Verified patient identification, verified procedure, site/side was marked, verified correct patient position, special equipment/implants available, medications/allergies/relevent history reviewed, required imaging and test results available. Performed.  During this procedure the patient is administered a total of Versed 3 mg and Fentanyl 75 mg to achieve and maintain moderate conscious sedation. The patient's heart rate, blood pressure, and oxygen saturation are monitored continuously during the procedure. The period of conscious sedation is 44 minutes, of which I was present face-to-face 100% of this time.  Access:  RIGHT Radial Artery: 6 Fr sheath -- Seldinger technique using Angiocath Micropuncture Kit 10 mL radial cocktail IA; 2500 Units IV Heparin  Right Common Femoral Vein: 7 Fr Sheath - Seldinger Technique..   Right Heart Catheterization: 7 Fr Gordy Councilman catheter advanced under fluoroscopy with balloon inflated to the RA, RV, then PCWP-PA for hemodynamic measurement.  Simultaneous FA & PA blood gases checked for SaO2% to calculate FICK CO/CI  Thermodilution Injections performed to calculate CO/CI  Simultaneous PCWP/LV & RV/LV pressures monitored with Angled Pigtail in LV.  Catheter removed completely out of the body with balloon deflated.  Left Heart Catheterization: 5Fr Catheters  advanced or exchanged over a J-wire under direct fluoroscopic guidance into the ascending aorta; TIG 4.0 catheter advanced firsvv.0 Left & RIGHT Coronary Artery Cineangiography: TIG 4.0 Catheter  Right Coronary Artery, SVG-RCA & SVG-OM Cineangiography: n/a  Femoral Venous Sheath(s) removed in the PACU Holding Area with manual pressure for hemostasis.   Radial sheath removed in the Cardiac Catheterization lab with TR Band placed for hemostasis.  TR Band: 1350 Hours; 17 mL air  MEDICATIONS * 75 g IV fentanyl, 3 mgIV Versed * SQ Lidocaine 87m for radial, 18 mL for femoral vein * Radial Cocktail: 3 mg in verapamil 10 mL NS * Isovue Contrast: 70 * Heparin: 2500 Units    Coronary Findings    Dominance: Right   Left Main  . Vessel is large.     Left Anterior Descending   . First Diagonal Branch   The vessel is small in size.   . First Septal Branch   The vessel is small in size.   .Marland KitchenSecond Diagonal Branch   The vessel is moderate in size.   .Marland KitchenSecond Septal Branch   The vessel is small in size.   . Third Diagonal Branch   The vessel is small in size.   . Third Septal Branch   The vessel is small in size.     Left Circumflex   . Lateral First Obtuse Marginal Branch   The vessel is small in size.   . Third Obtuse Marginal Branch   The vessel is small in size.   . 3rd Mrg lesion, 99% stenosed. Diffuse. Feels very faintly to distal.     Right Coronary Artery  . Vessel is large. Vessel is angiographically normal. The vessel is tortuous.   . Acute Marginal Branch   The vessel is small in size.   . Right Posterior Atrioventricular Branch   The vessel is moderate in size.   . First Right  Posterolateral   The vessel is small in size.   Marland Kitchen Second Right Posterolateral   The vessel is moderate in size.   . Third Right Posterolateral   The vessel is large in size.       Right Heart Pressures Normal right heart cath pressures LV EDP is normal.    Left Heart    Left  Ventricle There is moderate to severe left ventricular systolic dysfunction. EF roughly 35% by echo The ejection fraction could not be assess due to No LV gram.   Aortic Valve There is no aortic valve stenosis. The aortic valve is calcified. Moderate-severe by echo    Coronary Diagrams    Diagnostic Diagram            Implants     No implant documentation for this case.    PACS Images    Show images for Cardiac catheterization     Link to Procedure Log    Procedure Log      Hemo Data       Most Recent Value   Fick Cardiac Output  3.69 L/min   Fick Cardiac Output Index  2.43 (L/min)/BSA   Thermal Cardiac Output  4.06 L/min   Thermal Cardiac Output Index  2.67 (L/min)/BSA   Mitral Mean Gradient  4.1 mmHg   Mitral Peak Gradient  0 mmHg   Mitral Valve Area Index  1.72 cm2/BSA   RA A Wave  6 mmHg   RA V Wave  5 mmHg   RA Mean  4 mmHg   RV Systolic Pressure  28 mmHg   RV Diastolic Pressure  3 mmHg   RV EDP  6 mmHg   PA Systolic Pressure  31 mmHg   PA Diastolic Pressure  13 mmHg   PA Mean  20 mmHg   PW A Wave  13 mmHg   PW V Wave  10 mmHg   PW Mean  10 mmHg   LV Systolic Pressure  585 mmHg   LV Diastolic Pressure  3 mmHg   LV EDP  8 mmHg   Arterial Occlusion Pressure Extended Systolic Pressure  277 mmHg   Arterial Occlusion Pressure Extended Diastolic Pressure  47 mmHg   Arterial Occlusion Pressure Extended Mean Pressure  76 mmHg   Left Ventricular Apex Extended Systolic Pressure  824 mmHg   Left Ventricular Apex Extended Diastolic Pressure  4 mmHg   Left Ventricular Apex Extended EDP Pressure  8 mmHg   TPVR Index  7.49 HRUI     Procedure: TEE  Sedation: Versed 5 mg IV, Fentanyl 50 mcg   Indication: Aortic insufficiency  Findings: Please see echo section for full report. Mildly dilated LV with mild LV hypertrophy. EF 35%, diffuse hypokinesis. Normal RV size and systolic function. Normal LA size with no LAA  thrombus. Normal RA size. Trivial TR. Trivial MR. The aortic valve was trileaflet but leaflets appeared redundant. There was severe, eccentric aortic insufficiency. No aortic stenosis. Unfortunately, the TEE machine used does not have 3D. The aortic root measured 3.6 cm and the ascending aorta was not dilated. No PFO/ASD (negative bubble study). There was holodiastolic flow reversal in the ascending aorta and partial diastolic flow reversal in the descending thoracic aorta.   Impression: Severe aortic insufficiency.   Loralie Champagne 02/11/2016    Impression:  Patient has stage D severe symptomatic aortic insufficiency.  I have personally reviewed the patient's chart, transthoracic and transesophageal echocardiograms, CT angiogram of the chest, MRI of the  brain, and diagnostic cardiac catheterization.   At the time the patient's most recent hospitalization he presented with acute combined systolic and diastolic congestive heart failure, New York Heart Association functional class IV.   Symptoms improved quickly with diuretic therapy.  His previous clinical presentation is somewhat suspicious for the possibility of subacute bacterial endocarditis, although blood cultures were not obtained when he was hospitalized in February, blood cultures obtained during his recent hospitalization were negative, and transesophageal echocardiogram did not reveal the presence of obvious vegetations.  Transesophageal echocardiogram confirmed the presence of essentially wide open aortic insufficiency with possible perforation of at least one of the leaflets of the aortic valve. There is moderate left ventricular systolic dysfunction. Under the circumstances I favor proceeding with aortic valve replacement as soon as practical.  The patient has not seen a dentist in many years and should be evaluated prior to surgery.   Plan:  The patient was counseled at length regarding treatment alternatives for management of  severe aortic insufficiency including continued medical therapy versus proceeding with aortic valve replacement in the near future.  The natural history of aortic insufficiency was reviewed, as was long term prognosis with medical therapy alone.  Surgical options were discussed at length including conventional surgical aortic valve replacement through either a full median sternotomy or using minimally invasive techniques.  Discussion was held comparing the relative risks of mechanical valve replacement with need for lifelong anticoagulation versus use of a bioprosthetic tissue valve and the associated potential for late structural valve deterioration and failure.  This discussion was placed in the context of the patient's particular circumstances, and as a result the patient specifically requests that their valve be replaced using a bioprosthetic tissue valve.     I have offered to schedule the patient for surgery as early as next week. For personal reasons the patient desires to wait until sometime in July so that he can get his personal affairs in order. We have discussed expectations for his postoperative convalescence, particularly given the fact that he lives alone. All of his questions have been addressed. We tentatively plan to proceed with surgery on Thursday, 03/18/2016. The patient has been advised to monitor his weight closely and look for signs of fluid retention such as increased shortness of breath, weight gain, and lower extremity edema. He will be referred to the dental clinic for evaluation as soon as practical. The patient will return for follow-up on 03/16/2016 prior to surgery.   I spent in excess of 90 minutes during the conduct of this office consultation and >50% of this time involved direct face-to-face encounter with the patient for counseling and/or coordination of their care.   Valentina Gu. [name], MD 02/17/2016 3:50 PM

## 2016-02-20 ENCOUNTER — Ambulatory Visit (INDEPENDENT_AMBULATORY_CARE_PROVIDER_SITE_OTHER): Payer: Medicare HMO | Admitting: Physician Assistant

## 2016-02-20 ENCOUNTER — Encounter: Payer: Self-pay | Admitting: Physician Assistant

## 2016-02-20 VITALS — BP 160/54 | HR 70 | Ht 64.0 in | Wt 120.4 lb

## 2016-02-20 DIAGNOSIS — I2510 Atherosclerotic heart disease of native coronary artery without angina pectoris: Secondary | ICD-10-CM

## 2016-02-20 DIAGNOSIS — I351 Nonrheumatic aortic (valve) insufficiency: Secondary | ICD-10-CM | POA: Diagnosis not present

## 2016-02-20 DIAGNOSIS — I272 Other secondary pulmonary hypertension: Secondary | ICD-10-CM | POA: Diagnosis not present

## 2016-02-20 DIAGNOSIS — I5022 Chronic systolic (congestive) heart failure: Secondary | ICD-10-CM

## 2016-02-20 DIAGNOSIS — E876 Hypokalemia: Secondary | ICD-10-CM

## 2016-02-20 DIAGNOSIS — I10 Essential (primary) hypertension: Secondary | ICD-10-CM

## 2016-02-20 LAB — BASIC METABOLIC PANEL
BUN: 13 mg/dL (ref 7–25)
CALCIUM: 9.1 mg/dL (ref 8.6–10.3)
CO2: 28 mmol/L (ref 20–31)
CREATININE: 0.7 mg/dL (ref 0.70–1.25)
Chloride: 106 mmol/L (ref 98–110)
Glucose, Bld: 85 mg/dL (ref 65–99)
Potassium: 4.5 mmol/L (ref 3.5–5.3)
SODIUM: 140 mmol/L (ref 135–146)

## 2016-02-20 MED ORDER — CARVEDILOL 3.125 MG PO TABS: 3.1250 mg | ORAL_TABLET | Freq: Two times a day (BID) | ORAL | Status: DC

## 2016-02-20 NOTE — Progress Notes (Signed)
CARDIOLOGY OFFICE NOTE  Date:  02/20/2016    Danton Sewer Date of Birth: [birth year] Medical Record [id number]  PCP:  [name], MD  Cardiologist:  Dr. Stanford Breed    Chief Complaint  Patient presents with  . Hospitalization Follow-up    seen for Dr. Stanford Breed    History of Present Illness: Tyee Hoggard is a 65 y.o. male who presents today for post hospital cardiology follow-up. 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 Echocardiogram obtained on 10/16/2015 showed EF 40-45%, diffuse hypokinesis, severe AR, moderate MR, moderate TR, PEA peak pressure 81 mmHg. Over the past several month, he has been having worsening dyspnea on exertion, he also described orthopnea and PND episodes. He eventually was admitted again on [id number] for acute systolic heart failure. Review of the previous echocardiogram shows an ejection fraction appears to be around 35-40% with significant aortic insufficiency. His BNP was greater than 4500 on arrival. He also has poorly controlled hypertension, amlodipine was added. Echocardiogram was repeated on 6/2 which confirms EF 35-40%, severe AR, mild MR, severely dilated left atrium, PEA peak pressure 45 mmHg. He underwent a left and right heart cath on 02/09/2016 which showed essentially normal coronaries with only one notable lesion in a very small caliber third OM branch with 90% stenosis. Post cath hospitalization was complicated by the presence of a small pseudoaneurysm in the right groin measuring 1.13 cm with simple neck posterior to the common femoral artery measuring 0.47 cm. He underwent TEE on 02/11/2016 which confirms severe AI. He was then discharged to follow-up with cardiology and have outpatient cardiothoracic surgery consultation.  He was seen by Dr. [name] on 02/17/2016, surgical options were discussed with the patient who agreed to undergo AVR with  bioprosthetic tissue valve. Surgery tentatively planned for 03/18/2016. He presents today for cardiology follow-up. He has some right upper thigh ecchymosis, however it is getting better. It is somewhat tender, the tenderness has been getting better as well. Otherwise he denies any significant shortness of breath. His blood pressure at home has been elevated as well in the 150s. I will add low-dose Coreg. He is scheduled to undergo AVR in a month.   Past Medical History  Diagnosis Date  . Hepatitis C   . Hypertension   . Headache   . Chronic systolic CHF (congestive heart failure) ([place])   . Severe aortic regurgitation   . Mitral regurgitation   . Tricuspid regurgitation   . 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 Protein calorie malnutrition ([place])   . Nephrolithiasis   . Seizure [facility])     Past Surgical History  Procedure Laterality Date  . Fracture surgery    . Rotator cuff repair    . Cardiac catheterization N/A 02/09/2016    Procedure: Right/Left Heart Cath and Coronary Angiography;  Surgeon: [name], MD;  Location: [place] [facility];  Service: Cardiovascular;  Laterality: N/A;  . Tee without cardioversion N/A 02/11/2016    Procedure: TRANSESOPHAGEAL ECHOCARDIOGRAM (TEE);  Surgeon: [name], MD;  Location: [place];  Service: Cardiovascular;  Laterality: N/A;     Medications: Current Outpatient Prescriptions  Medication Sig Dispense Refill  . aspirin EC 325 MG EC tablet Take 1 tablet (325 mg total) by mouth daily. 30 tablet 0  . atorvastatin (LIPITOR) 10 MG tablet Take 1 tablet (10 mg total) by mouth daily at 6 PM. 30 tablet  0  . feeding supplement, ENSURE ENLIVE, (ENSURE ENLIVE) LIQD Take 237 mLs by mouth 3 (three) times daily between meals. 90 Bottle 0  . furosemide (LASIX) 40 MG tablet Take 1 tablet (40 mg total) by mouth daily. 30 tablet 0  . lisinopril (PRINIVIL,ZESTRIL) 40 MG tablet Take 1 tablet (40 mg total) by mouth daily. 30 tablet 0  . traMADol (ULTRAM) 50  MG tablet Take 50 mg by mouth every 6 (six) hours as needed (leg pain).    . carvedilol (COREG) 3.125 MG tablet Take 1 tablet (3.125 mg total) by mouth 2 (two) times daily. 180 tablet 3   No current facility-administered medications for this visit.    Allergies: No Known Allergies  Social History: The patient  reports that he has quit smoking. He has never used smokeless tobacco. He reports that he drinks alcohol. He reports that he uses illicit drugs (Marijuana).   Family History: The patient's family history includes Aneurysm in his mother; Breast cancer in his mother; Cancer in his father; Hypertension in his brother and father.   Review of Systems: Please see the history of present illness.   Otherwise, the review of systems is positive for echymosis of R upper thigh, DOE.   All other systems are reviewed and negative.   Physical Exam: VS:  BP 160/54 mmHg  Pulse 70  Ht 5\' 4"  (1.626 m)  Wt 120 lb 6.4 oz (54.613 kg)  BMI 20.66 kg/m2 .  BMI Body mass index is 20.66 kg/(m^2).  Wt Readings from Last 3 Encounters:  02/20/16 120 lb 6.4 oz (54.613 kg)  02/17/16 121 lb (54.885 kg)  02/11/16 107 lb 14.4 oz (48.943 kg)    General: Pleasant. Well developed, well nourished and in no acute distress.  HEENT: Normal. Neck: Supple, no JVD, carotid bruits, or masses noted.  Cardiac: Regular rate and rhythm. No murmurs, rubs, or gallops. No edema.  Respiratory:  Lungs are clear to auscultation bilaterally with normal work of breathing.  GI: Soft and nontender.  MS: No deformity or atrophy. Gait and ROM intact. Skin: Warm and dry. Color is normal. Ecchymosis of R upper thigh, R femoral cath site still has a small hematoma, no bruit on exam  Neuro:  Strength and sensation are intact and no gross focal deficits noted.  Psych: Alert, appropriate and with normal affect.   LABORATORY DATA:  EKG:  EKG is ordered today. This demonstrates NSR with nonspecific T wave changes.  Lab Results    Component Value Date   WBC 7.5 02/11/2016   HGB 12.1* 02/11/2016   HCT 37.0* 02/11/2016   PLT 218 02/11/2016   GLUCOSE 85 02/20/2016   CHOL 159 10/16/2015   TRIG 41 10/16/2015   HDL 49 10/16/2015   LDLCALC 102* 10/16/2015   ALT 25 02/11/2016   AST 51* 02/11/2016   NA 140 02/20/2016   K 4.5 02/20/2016   CL 106 02/20/2016   CREATININE 0.70 02/20/2016   BUN 13 02/20/2016   CO2 28 02/20/2016   TSH 1.828 10/17/2015   PSA 2.97 10/17/2015   INR 1.03 02/09/2016   HGBA1C 5.3 10/16/2015    BNP (last 3 results)  Recent Labs  10/15/15 1420 02/05/16 1421  BNP 2457.0* >4500.0*     Other Studies Reviewed Today:  Echo 10/16/2015 LV EF: 40% - 45%  ------------------------------------------------------------------- Indications: CHF - 428.0.  ------------------------------------------------------------------- History: PMH: Hepatits C. Headache. Transient ischemic attack. Risk factors: Hypertension.  ------------------------------------------------------------------- Study Conclusions  - Left ventricle: The cavity  size was normal. There was moderate  concentric hypertrophy. Systolic function was mildly to  moderately reduced. The estimated ejection fraction was in the  range of 40% to 45%. Diffuse hypokinesis. - Aortic valve: Trileaflet; mildly thickened, mildly calcified  leaflets. There was severe regurgitation. - Mitral valve: There was moderate regurgitation directed  posteriorly. - Right ventricle: The cavity size was mildly dilated. Wall  thickness was normal. - Right atrium: The atrium was moderately dilated. - Tricuspid valve: There was moderate regurgitation. - Pulmonary arteries: Systolic pressure was severely increased. PA  peak pressure: 81 mm Hg (S).   Echo 02/06/2016 LV EF: 35% - 40%  ------------------------------------------------------------------- Indications: CHF -  428.0.  ------------------------------------------------------------------- History: PMH: Mitral valve disease. Aortic valve disease. Risk factors: Hypertension.  ------------------------------------------------------------------- Study Conclusions  - Left ventricle: The cavity size was normal. Wall thickness was  normal. Systolic function was moderately reduced. The estimated  ejection fraction was in the range of 35% to 40%. Diffuse  hypokinesis. There was fusion of early and atrial contributions  to ventricular filling. The study is not technically sufficient  to allow evaluation of LV diastolic function. - Aortic valve: Moderately calcified annulus. Mild diffuse  calcification. There was moderate to severe regurgitation. - Mitral valve: There was mild regurgitation. - Left atrium: The atrium was severely dilated. - Atrial septum: No defect or patent foramen ovale was identified. - Pulmonary arteries: PA peak pressure: 45 mm Hg (S).  Impressions:  - The right ventricular systolic pressure was increased consistent  with moderate pulmonary hypertension.   Cath 02/09/2016 Conclusion     Essentially normal coronary arteries with only one notable lesion in a very small caliber third OM branch with 90% stenosis  There is moderate to severe left ventricular systolic dysfunction and moderate to severe aortic regurgitation by echocardiogram. Follow-up echocardiogram this coming weekend or later on this week.   Thankfully, would appear that the patient's symptoms dyspnea and chest discomfort are not likely the result of significant coronary disease.  Continued evaluation of aortic insufficiency. He appears to be euvolemic.  Plan: Return to nursing unit for post catheterization care and TR band removal.  He appears to be adequately diuresed.  Defer further evaluation to cardiology consult primary service.     TEE 02/11/2016 Mildly dilated LV with mild LV  hypertrophy. EF 35%, diffuse hypokinesis. Normal RV size and systolic function. Normal LA size with no LAA thrombus. Normal RA size. Trivial TR. Trivial MR. The aortic valve was trileaflet but leaflets appeared redundant. There was severe, eccentric aortic insufficiency. No aortic stenosis. Unfortunately, the TEE machine used does not have 3D. The aortic root measured 3.6 cm and the ascending aorta was not dilated. No PFO/ASD (negative bubble study). There was holodiastolic flow reversal in the ascending aorta and partial diastolic flow reversal in the descending thoracic aorta.   Impression: Severe aortic insufficiency.    Assessment/Plan:  1. Severe AI pending upcoming AVR  - seen by Dr. [name], plan for AVR with bioprosthetic valve in July  2. Chronic systolic heart failure: euvolemic on physical exam, continue current dose of lasix, check BMET  3. Hypertension: uncontrolled, add low dose coreg 3.125mg  BID  4. 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 Pseudoaneurysm post cath: appears to have resolved, some degree of R upper thigh echymosis, i asked the patient to keep an eye on it and seek medical attention if has increasing pain and  increasing bruising. Otherwise, expect it to heal gradually in the next few weeks   I did not bill transition of care visit as our scheduler was unable to reach him within 48 hours after discharge to check on his progress   Current medicines are reviewed with the patient today.  The patient does not have concerns regarding medicines other than what has been noted above.  The following changes have been made:  See above.  Labs/ tests ordered today include:    Orders Placed This Encounter  Procedures  . Basic Metabolic Panel (BMET)  . EKG 12-Lead     Disposition:   FU with Dr. Stanford Breed in 2  months.   Patient is agreeable to this plan and will call if any  problems develop in the interim.   Signed: Almyra Deforest PA-C 02/20/2016 7:23 PM  [facility] [address] [place], [place]  [address] Phone: [phone] Fax: [phone]

## 2016-02-20 NOTE — Patient Instructions (Addendum)
Medication Instructions:  Your physician has recommended you make the following change in your medication:  1.  START Coreg 3.125 taking 1 tablet twice a day   Labwork: TODAY:  BMET  Testing/Procedures: None ordered  Follow-Up: Your physician recommends that you schedule a follow-up appointment in: 2 MONTHS WITH DR.CRENSHAW   Any Other Special Instructions Will Be Listed Below (If Applicable).     If you need a refill on your cardiac medications before your next appointment, please call your pharmacy.

## 2016-02-23 ENCOUNTER — Telehealth (HOSPITAL_COMMUNITY): Payer: Self-pay

## 2016-02-25 ENCOUNTER — Ambulatory Visit (HOSPITAL_COMMUNITY): Payer: Self-pay | Admitting: Dentistry

## 2016-02-25 ENCOUNTER — Encounter (HOSPITAL_COMMUNITY): Payer: Self-pay | Admitting: Dentistry

## 2016-02-25 ENCOUNTER — Encounter (INDEPENDENT_AMBULATORY_CARE_PROVIDER_SITE_OTHER): Payer: Self-pay

## 2016-02-25 VITALS — BP 124/42 | HR 61 | Temp 97.9°F

## 2016-02-25 DIAGNOSIS — K083 Retained dental root: Secondary | ICD-10-CM | POA: Insufficient documentation

## 2016-02-25 DIAGNOSIS — K029 Dental caries, unspecified: Secondary | ICD-10-CM | POA: Insufficient documentation

## 2016-02-25 DIAGNOSIS — I351 Nonrheumatic aortic (valve) insufficiency: Secondary | ICD-10-CM

## 2016-02-25 DIAGNOSIS — K08409 Partial loss of teeth, unspecified cause, unspecified class: Secondary | ICD-10-CM

## 2016-02-25 DIAGNOSIS — Z01818 Encounter for other preprocedural examination: Secondary | ICD-10-CM

## 2016-02-25 DIAGNOSIS — K045 Chronic apical periodontitis: Secondary | ICD-10-CM

## 2016-02-25 DIAGNOSIS — M264 Malocclusion, unspecified: Secondary | ICD-10-CM

## 2016-02-25 DIAGNOSIS — K036 Deposits [accretions] on teeth: Secondary | ICD-10-CM

## 2016-02-25 DIAGNOSIS — K0889 Other specified disorders of teeth and supporting structures: Secondary | ICD-10-CM

## 2016-02-25 DIAGNOSIS — IMO0002 Reserved for concepts with insufficient information to code with codable children: Secondary | ICD-10-CM

## 2016-02-25 DIAGNOSIS — K030 Excessive attrition of teeth: Secondary | ICD-10-CM

## 2016-02-25 DIAGNOSIS — K0530 Chronic periodontitis, unspecified: Secondary | ICD-10-CM | POA: Insufficient documentation

## 2016-02-25 NOTE — Progress Notes (Signed)
DENTAL CONSULTATION  Date of Consultation:  02/25/2016 Patient Name:   Alexis [name] Date of Birth:   November 12, 1950 Medical Record Number: [id number]  VITALS: BP 124/42 mmHg  Pulse 61  Temp(Src) 97.9 F (36.6 C) (Oral)  CHIEF COMPLAINT: Patient referred by Dr. [name] for a dental consultation.   HPI: Alexis [name] is a 65 year old male recently diagnosed with severe aortic regurgitation. Patient with anticipated aortic valve replacement with Dr. [name]. Patient is now seen as part of a medically necessary pre-heart valve surgery dental protocol examination to rule out dental infection that may affect the patient's systemic health and the anticipated heart valve surgery.  The patient currently denies acute toothaches, swellings, or abscesses. Patient does have a history of a toothache involving the lower right quadrant approximately 2-3 months ago. Patient described the pain as being sharp and constant in nature. Pain reached an intensity of 10 out of 10 but is currently 0 out of 10. Patient last saw the dentist for multiple dental extractions in approximately 1998. Patient denies having any complications from those dental extractions. Patient denies having any partial dentures. Patient denies having a primary dentist. Patient does have some degree of dental phobia. Patient indicates that he "does not like pain".  PROBLEM LIST: Patient Active Problem List   Diagnosis Date Noted  . Severe aortic regurgitation 02/11/2016    Priority: High  . Chronic apical periodontitis 02/25/2016  . Retained dental roots 02/25/2016  . Dental caries 02/25/2016  . Chronic periodontitis 02/25/2016  . Accretions on teeth 02/25/2016  . Tricuspid regurgitation   . CAD in native artery 02/10/2016  . Postoperative groin pseudoaneurysm ([place]) 02/10/2016  . Aortic regurgitation   . Troponin level elevated   . Pulmonary hypertension ([place]) 02/05/2016  . History of CVA (cerebrovascular accident) 02/05/2016  . Hypokalemia  02/05/2016  . Marijuana abuse 02/05/2016  . Protein-calorie malnutrition, severe 10/17/2015  . Acute embolic stroke ([place])   . Cerebral thrombosis with cerebral infarction 10/16/2015  . Essential hypertension   . Acute respiratory failure with hypoxia ([place]) 10/15/2015  . Acute on chronic systolic CHF (congestive heart failure) ([place]) 10/15/2015  . Nausea vomiting and diarrhea 10/15/2015  . Loss of weight 10/15/2015  . Chronic hepatitis C without hepatic coma ([place])   . Chronic cluster headache, not intractable     PMH: Past Medical History  Diagnosis Date  . Hepatitis C   . Hypertension   . Headache   . Chronic systolic CHF (congestive heart failure) ([place])   . Severe aortic regurgitation   . Mitral regurgitation   . Tricuspid regurgitation   . Stroke (cerebrum) ([place]) 10/2015  . Protein calorie malnutrition ([place])   . Nephrolithiasis   . Seizure ([place])     PSH: Past Surgical History  Procedure Laterality Date  . Fracture surgery    . Rotator cuff repair    . Cardiac catheterization N/A 02/09/2016    Procedure: Right/Left Heart Cath and Coronary Angiography;  Surgeon: [name], MD;  Location: [facility];  Service: Cardiovascular;  Laterality: N/A;  . Tee without cardioversion N/A 02/11/2016    Procedure: TRANSESOPHAGEAL ECHOCARDIOGRAM (TEE);  Surgeon: [name], MD;  Location: [facility];  Service: Cardiovascular;  Laterality: N/A;    ALLERGIES: No Known Allergies  MEDICATIONS: Current Outpatient Prescriptions  Medication Sig Dispense Refill  . aspirin EC 325 MG EC tablet Take 1 tablet (325 mg total) by mouth daily. 30 tablet 0  . atorvastatin (LIPITOR) 10 MG tablet Take 1  tablet (10 mg total) by mouth daily at 6 PM. 30 tablet 0  . carvedilol (COREG) 3.125 MG tablet Take 1 tablet (3.125 mg total) by mouth 2 (two) times daily. 180 tablet 3  . feeding supplement, ENSURE ENLIVE, (ENSURE ENLIVE) LIQD Take 237 mLs by mouth 3 (three) times daily between meals.  90 Bottle 0  . furosemide (LASIX) 40 MG tablet Take 1 tablet (40 mg total) by mouth daily. 30 tablet 0  . lisinopril (PRINIVIL,ZESTRIL) 40 MG tablet Take 1 tablet (40 mg total) by mouth daily. 30 tablet 0  . traMADol (ULTRAM) 50 MG tablet Take 50 mg by mouth every 6 (six) hours as needed (leg pain). Reported on 02/25/2016     No current facility-administered medications for this visit.    LABS: Lab Results  Component Value Date   WBC 7.5 02/11/2016   HGB 12.1* 02/11/2016   HCT 37.0* 02/11/2016   MCV 89.8 02/11/2016   PLT 218 02/11/2016      Component Value Date/Time   NA 140 02/20/2016 1027   K 4.5 02/20/2016 1027   CL 106 02/20/2016 1027   CO2 28 02/20/2016 1027   GLUCOSE 85 02/20/2016 1027   BUN 13 02/20/2016 1027   CREATININE 0.70 02/20/2016 1027   CREATININE 0.94 02/11/2016 0432   CALCIUM 9.1 02/20/2016 1027   GFRNONAA >60 02/11/2016 0432   GFRAA >60 02/11/2016 0432   Lab Results  Component Value Date   INR 1.03 02/09/2016   INR 1.13 10/15/2015   No results found for: PTT  SOCIAL HISTORY: Social History   Social History  . Marital Status: Married    Spouse Name: N/A  . Number of Children: 3  . Years of Education: N/A   Occupational History  . Retired     [employer]   Social History Main Topics  . Smoking status: Former Smoker -- 0.50 packs/day for 6 years    Types: Cigarettes    Quit date: 09/07/1971  . Smokeless tobacco: Former Systems developer    Types: Chew  . Alcohol Use: 0.0 oz/week    0 Standard drinks or equivalent per week     Comment: occl  . Drug Use: Yes    Special: Marijuana     Comment: THC  . Sexual Activity: Not on file   Other Topics Concern  . Not on file   Social History Narrative    FAMILY HISTORY: Family History  Problem Relation Age of Onset  . Breast cancer Mother   . Aneurysm Mother     Brain aneurysm  . Hypertension Father   . Cancer Father     Pancreatic cancer  . Hypertension Brother     REVIEW OF SYSTEMS: Reviewed the  Review of systems from Dr. [name] dated 02/17/2016 with the patient with changes noted in bold.  General:+decreased appetite, decreased energy, + weight loss, + low grade fever Cardiac:no chest pain with exertion, no chest pain at rest, +SOB with exertion, + resting SOB, + PND, + orthopnea, no palpitations, no arrhythmia, no atrial fibrillation, no LE edema, no dizzy spells Respiratory:+ shortness of breath, no home oxygen, no productive cough, + chronic dry cough, + possible bronchitis, no wheezing, no hemoptysis, no asthma, no pain with inspiration or cough, no sleep apnea, no CPAP at night ID:[id number] difficulty swallowing, + reflux, + frequent heartburn, no hiatal hernia, no abdominal pain, no constipation, no diarrhea, no hematochezia, no hematemesis, no melena GU:no dysuria, no frequency, no urinary tract infection, no hematuria, no enlarged  prostate, + H/O kidney stones, no kidney disease Vascular:no pain suggestive of claudication, no pain in feet, no leg cramps, no varicose veins, no DVT, no non-healing foot ulcer Neuro:+ recent stroke, no TIA's, + seizures- 12-13 years ago, + headaches, no temporary blindness one eye,+ slurred speech, no peripheral neuropathy, + chronic pain, no instability of gait, no memory/cognitive dysfunction Musculoskeletal:no arthritis, no joint swelling, no myalgias, no difficulty walking, normal mobility  Skin:+ rash, no itching, no skin infections, no pressure sores or ulcerations Psych:+ dental phobia, + anxiety, + depression, no nervousness, no unusual recent stress Eyes:+ blurry vision, no floaters, no recent vision changes, + wears glasses  ENT:no hearing loss, + history of toothache 2-3 months ago, no loose teeth, no dentures, last saw dentist in 1998.  Hematologic:no easy bruising, no abnormal bleeding, no clotting disorder, + frequent epistaxis Endocrine:no diabetes, does not check CBG's at  home  DENTAL HISTORY: CHIEF COMPLAINT: Patient referred by Dr. [name] for a dental consultation.   HPI: Alexis [name] is a 65 year old male recently diagnosed with severe aortic regurgitation. Patient with anticipated aortic valve replacement with Dr. [name]. Patient is now seen as part of a medically necessary pre-heart valve surgery dental protocol examination to rule out dental infection that may affect the patient's systemic health and the anticipated heart valve surgery.  The patient currently denies acute toothaches, swellings, or abscesses. Patient does have a history of a toothache involving the lower right quadrant approximately 2-3 months ago. Patient described the pain as being sharp and constant in nature. Pain reached an intensity of 10 out of 10 but is currently 0 out of 10. Patient last saw the dentist for multiple dental extractions in approximately 1998. Patient denies having any complications from those dental extractions. Patient denies having any partial dentures. Patient denies having a primary dentist. Patient does have some degree of dental phobia. Patient indicates that he "does not like pain".  DENTAL EXAMINATION: GENERAL: Patient is well-developed, well-nourished male in no acute distress. HEAD AND NECK: There is palpable bilateral lymphadenopathy that is nontender to palpation. Patient denies acute TMJ symptoms. INTRAORAL EXAM: Patient has incipient xerostomia.  There is buccal swelling in the area of tooth #30 that is tender to palpation.  There is atrophy of the edentulous alveolar ridges. DENTITION: The patient is missing tooth numbers 1, 16, 17, 18, 19, 20, 31, and 32. There are retained roots in the area of tooth numbers 3, 12, and 29. There is excessive attrition/erosion involving the maxillary and mandibular dentition. PERIODONTAL: Patient has chronic periodontitis with plaque and calculus accumulations, gingival recession, and tooth mobility DENTAL CARIES/SUBOPTIMAL  RESTORATIONS: Patient has multiple dental caries and multiple flexure lesions. ENDODONTIC: Patient currently denies acute pulpitis symptoms. Patient did have a history of toothache symptoms involving the lower right quadrant approximately 2-3 months ago. There is evidence of periapical pathology involving tooth numbers 12, 28, 29, and 30. CROWN AND BRIDGE: There are no crown or bridge restorations. PROSTHODONTIC: Patient denies having any partial dentures. OCCLUSION: Patient has a poor occlusal scheme secondary to multiple missing teeth, multiple retained root segments, supra-eruption and drifting of the unopposed teeth into the edentulous areas, and lack of replacement of the missing teeth with dental prostheses.  RADIOGRAPHIC INTERPRETATION: An orthopantogram was taken and supplemented with 12 periapical radiographs. There are multiple missing teeth. There are multiple retained root segments. There are multiple areas of periapical radiolucency and pathology. There is incipient to moderate bone loss noted. There is evidence of excessive attrition. There  is supra-eruption and drifting of the unopposed teeth into the edentulous areas.  ASSESSMENTS: 1. Severe aortic regurgitation 2. Pre-heart valve surgery dental protocol 3. Chronic apical periodontitis 4. Dental caries 5. Excessive attrition/erosion  6. Multiple retained root segments 7. Chronic periodontitis with bone loss 8. Accretions 9. Gingival recession 10. Tooth mobility 11. Multiple missing teeth 12. Supra-eruption and drifting of the unopposed teeth into the edentulous areas   13. Atrophy of the edentulous alveolar ridges 14. Poor occlusal scheme and malocclusion 15. Risk for complications up to and including death with anticipated invasive dental procedures in the operative room with general anesthesia.   PLAN/RECOMMENDATIONS: 1. I discussed the risks, benefits, and complications of various treatment options with the patient in  relationship to his medical and dental conditions, anticipated heart valve surgery, and risk for endocarditis. We discussed various treatment options to include no treatment, multiple extractions with alveoloplasty, pre-prosthetic surgery as indicated, periodontal therapy, dental restorations, root canal therapy, crown and bridge therapy, implant therapy, and replacement of missing teeth as indicated. The patient currently wishes to proceed with multiple extractions with alveoloplasty and gross debridement of remaining dentition in the operating room with general anesthesia on 03/01/2016. The patient will then follow-up with the dentist of his choice for other restorations as indicated along with evaluation for replacement of missing teeth once medically stable from the anticipated heart valve surgery. Dr. [name] is tentatively scheduled to proceed with aortic valve replacement on 03/18/2016 at his discretion.  2. Discussion of findings with medical team and coordination of future medical and dental care as needed.  I spent in excess of  120 minutes during the conduct of this consultation and >50% of this time involved direct face-to-face encounter for counseling and/or coordination of the patient's care.    [name], DDS

## 2016-02-25 NOTE — Patient Instructions (Signed)
[facility]    Department of Dental Medicine     DR. Lourdez Mcgahan      HEART VALVES AND MOUTH CARE:  FACTS:   If you have any infection in your mouth, it can infect your heart valve.  If you heart valve is infected, you will be seriously ill.  Infections in the mouth can be SILENT and do not always cause pain.  Examples of infections in the mouth are gum disease, dental cavities, and abscesses.  Some possible signs of infection are: Bad breath, bleeding gums, or teeth that are sensitive to sweets, hot, and/or cold. There are many other signs as well.  WHAT YOU HAVE TO DO:   Brush your teeth after meals and at bedtime. Spend at least 2 minutes brushing well, especially behind your back teeth and all around your teeth that stand alone. Brush at the gumline also.  Do not go to bed without brushing your teeth and flossing.  If you gums bleed when you brush or floss, do NOT stop brushing or flossing. It usually means that your gums need more attention and better cleaning.   If your Dentist or Dr. Tyonna Talerico gave you a prescription mouthwash to use, make sure to use it as directed. If you run out of the medication, get a refill at the pharmacy.   If you were given any other medications or directions by your Dentist, please follow them. If you did not understand the directions or forget what you were told, please call. We will be happy to refresh her memory.  If you need antibiotics before dental procedures, make sure you take them one hour prior to every dental visit as directed.   Get a dental checkup every 4-6 months in order to keep your mouth healthy, or to find and treat any new infection. You will most likely need your teeth cleaned or gums treated at the same time.  If you are not able to come in for your scheduled appointment, call your Dentist as soon as possible to reschedule.  If you have a problem in between dental visits, call your Dentist.  

## 2016-02-26 ENCOUNTER — Other Ambulatory Visit (HOSPITAL_COMMUNITY): Payer: Self-pay

## 2016-02-26 NOTE — Pre-Procedure Instructions (Signed)
    Jhair Feick  02/26/2016      [facility] [address], [place] [address] [place] [address] Phone: [phone] Fax: [phone]    Your procedure is scheduled on Monday, March 01, 2016.  Report to [facility] Admitting at 5:30 A.M.  Call this number if you have problems the morning of surgery:  [phone]   Remember:  Do not eat food or drink liquids after midnight.  Take these medicines the morning of surgery with A SIP OF WATER Carvedilol (Coreg)   Do not wear jewelry, make-up or nail polish.  Do not wear lotions, powders, or cologne.  You may not wear deoderant.  Do not shave 48 hours prior to surgery.  Men may shave face and neck.   Do not bring valuables to the hospital.  [facility] is not responsible for any belongings or valuables.  Contacts, dentures or bridgework may not be worn into surgery.  Leave your suitcase in the car.  After surgery it may be brought to your room.  For patients admitted to the hospital, discharge time will be determined by your treatment team.  Patients discharged the day of surgery will not be allowed to drive home.   Special instructions:   [facility]- Preparing For Surgery  Before surgery, you can play an important role. Because skin is not sterile, your skin needs to be as free of germs as possible. You can reduce the number of germs on your skin by washing with CHG (chlorahexidine gluconate) Soap before surgery.  CHG is an antiseptic cleaner which kills germs and bonds with the skin to continue killing germs even after washing.  Please do not use if you have an allergy to CHG or antibacterial soaps. If your skin becomes reddened/irritated stop using the CHG.  Do not shave (including legs and underarms) for at least 48 hours prior to first CHG shower. It is OK to shave your face.  Please follow these instructions carefully.   1. Shower the NIGHT BEFORE SURGERY and the  MORNING OF SURGERY with CHG.   2. If you chose to wash your hair, wash your hair first as usual with your normal shampoo.  3. After you shampoo, rinse your hair and body thoroughly to remove the shampoo.  4. Use CHG as you would any other liquid soap. You can apply CHG directly to the skin and wash gently with a scrungie or a clean washcloth.   5. Apply the CHG Soap to your body ONLY FROM THE NECK DOWN.  Do not use on open wounds or open sores. Avoid contact with your eyes, ears, mouth and genitals (private parts). Wash genitals (private parts) with your normal soap.  6. Wash thoroughly, paying special attention to the area where your surgery will be performed.  7. Thoroughly rinse your body with warm water from the neck down.  8. DO NOT shower/wash with your normal soap after using and rinsing off the CHG Soap.  9. Pat yourself dry with a CLEAN TOWEL.   10. Wear CLEAN PAJAMAS   11. Place CLEAN SHEETS on your bed the night of your first shower and DO NOT SLEEP WITH PETS.    Day of Surgery: Do not apply any deodorants/lotions. Please wear clean clothes to the hospital/surgery center.     Please read over the following fact sheets that you were given. Cough and Deep Breathing

## 2016-02-27 ENCOUNTER — Encounter (INDEPENDENT_AMBULATORY_CARE_PROVIDER_SITE_OTHER): Payer: Self-pay

## 2016-02-27 ENCOUNTER — Encounter (HOSPITAL_COMMUNITY): Payer: Self-pay

## 2016-02-27 ENCOUNTER — Encounter (HOSPITAL_COMMUNITY)
Admission: RE | Admit: 2016-02-27 | Discharge: 2016-02-27 | Disposition: A | Discharge status: Home / Self Care | Payer: Medicare HMO | Source: Ambulatory Visit | Attending: Dentistry | Admitting: Dentistry

## 2016-02-27 DIAGNOSIS — I110 Hypertensive heart disease with heart failure: Secondary | ICD-10-CM | POA: Diagnosis not present

## 2016-02-27 DIAGNOSIS — K083 Retained dental root: Secondary | ICD-10-CM | POA: Diagnosis not present

## 2016-02-27 DIAGNOSIS — K045 Chronic apical periodontitis: Secondary | ICD-10-CM | POA: Diagnosis not present

## 2016-02-27 DIAGNOSIS — Z8673 Personal history of transient ischemic attack (TIA), and cerebral infarction without residual deficits: Secondary | ICD-10-CM | POA: Diagnosis not present

## 2016-02-27 DIAGNOSIS — I5042 Chronic combined systolic (congestive) and diastolic (congestive) heart failure: Secondary | ICD-10-CM | POA: Diagnosis not present

## 2016-02-27 DIAGNOSIS — E46 Unspecified protein-calorie malnutrition: Secondary | ICD-10-CM | POA: Diagnosis not present

## 2016-02-27 DIAGNOSIS — K029 Dental caries, unspecified: Secondary | ICD-10-CM | POA: Diagnosis not present

## 2016-02-27 DIAGNOSIS — Z79899 Other long term (current) drug therapy: Secondary | ICD-10-CM | POA: Diagnosis not present

## 2016-02-27 DIAGNOSIS — I351 Nonrheumatic aortic (valve) insufficiency: Secondary | ICD-10-CM | POA: Diagnosis not present

## 2016-02-27 DIAGNOSIS — Z87891 Personal history of nicotine dependence: Secondary | ICD-10-CM | POA: Diagnosis not present

## 2016-02-27 DIAGNOSIS — Z7982 Long term (current) use of aspirin: Secondary | ICD-10-CM | POA: Diagnosis not present

## 2016-02-27 DIAGNOSIS — Z6820 Body mass index (BMI) 20.0-20.9, adult: Secondary | ICD-10-CM | POA: Diagnosis not present

## 2016-02-27 HISTORY — DX: Reserved for inherently not codable concepts without codable children: IMO0001

## 2016-02-27 HISTORY — DX: Major depressive disorder, single episode, unspecified: F32.9

## 2016-02-27 HISTORY — DX: Depression, unspecified: F32.A

## 2016-02-27 HISTORY — DX: Gastro-esophageal reflux disease without esophagitis: K21.9

## 2016-02-27 LAB — CBC
HEMATOCRIT: 36 % — AB (ref 39.0–52.0)
HEMOGLOBIN: 11.7 g/dL — AB (ref 13.0–17.0)
MCH: 29.5 pg (ref 26.0–34.0)
MCHC: 32.5 g/dL (ref 30.0–36.0)
MCV: 90.9 fL (ref 78.0–100.0)
Platelets: 269 10*3/uL (ref 150–400)
RBC: 3.96 MIL/uL — AB (ref 4.22–5.81)
RDW: 16 % — ABNORMAL HIGH (ref 11.5–15.5)
WBC: 4.6 10*3/uL (ref 4.0–10.5)

## 2016-02-27 LAB — PROTIME-INR
INR: 1.05 (ref 0.00–1.49)
Prothrombin Time: 13.9 seconds (ref 11.6–15.2)

## 2016-02-27 LAB — COMPREHENSIVE METABOLIC PANEL
ALBUMIN: 3.8 g/dL (ref 3.5–5.0)
ALK PHOS: 75 U/L (ref 38–126)
ALT: 21 U/L (ref 17–63)
AST: 27 U/L (ref 15–41)
Anion gap: 8 (ref 5–15)
BUN: 9 mg/dL (ref 6–20)
CALCIUM: 9.2 mg/dL (ref 8.9–10.3)
CHLORIDE: 111 mmol/L (ref 101–111)
CO2: 21 mmol/L — AB (ref 22–32)
CREATININE: 0.9 mg/dL (ref 0.61–1.24)
GFR calc non Af Amer: >60 mL/min (ref >60)
GLUCOSE: 80 mg/dL (ref 65–99)
Potassium: 3.7 mmol/L (ref 3.5–5.1)
SODIUM: 140 mmol/L (ref 135–145)
Total Bilirubin: 0.9 mg/dL (ref 0.3–1.2)
Total Protein: 7.1 g/dL (ref 6.5–8.1)

## 2016-02-27 NOTE — Progress Notes (Signed)
PATIENT STATED DR. Paulding HIM TO CONTINUE ALL MEDS INCLUDING ASPIRIN.  WILL LEAVE CHART FOR ANESTHESIA REVIEW. (NO ANSWER)  [facility]  [facility] .

## 2016-02-27 NOTE — Progress Notes (Signed)
Anesthesia Chart Review: Patient is a 65 year old male scheduled for multiple teeth extractions with alveoloplasty and gross debridement of remaining teeth on 03/01/16 by Dr. Enrique Sack. Patient has anticipated AVR on 03/18/16 by Dr. [name].  History includes former smoker, Hepatitis C, HTN, chronic systolic CHF, severe AR with mild MR/TR (02/2016 echo), pulmonary hypertension (02/2016 echo), right frontal lobe CVA (due to small vessel disease) 10/2015, seizure, SOB (with lying flat), depression, GERD, protein calorie malnutrition. He had a right femoral pseudoaneurysm following cardiac cath that had thrombosed by 02/10/16 U/S.   PCP is Dr. Francia Greaves. Cardiologist is Dr. Stanford Breed, last visit 02/20/16. He saw Neurologist Dr. [name] in 10/2015 with recommendations for follow-up with the [facility] or at [facility].  PAT Vitals: BP 128/52, HR 68, RR 18, T 36.3C, O2 sat 99%.  Med include ASA 325mg , Lipitor, Coreg, Ensure, Lasix, lisinopril.  02/20/16 EKG: NSR, possible LAE, LVH.  02/09/16 Cardiac cath:  Essentially normal coronary arteries with only one notable lesion in a very small caliber third OM branch with 90% stenosis  There is moderate to severe left ventricular systolic dysfunction and moderate to severe aortic regurgitation by echocardiogram. Follow-up echocardiogram this coming weekend or later on this week. Thankfully, would appear that the patient's symptoms dyspnea and chest discomfort are not likely the result of significant coronary disease. Continued evaluation of aortic insufficiency.  02/11/16 TEE:  Mildly dilated LV with mild LV hypertrophy. EF 35%, diffuse hypokinesis. Normal RV size and systolic function. Normal LA size with no LAA thrombus. Normal RA size. Trivial TR. Trivial MR. The aortic valve was trileaflet but leaflets appeared redundant. There was severe, eccentric aortic insufficiency. No aortic stenosis. Unfortunately, the TEE machine used does not have 3D.  The aortic root measured 3.6 cm and the ascending aorta was not dilated. No PFO/ASD (negative bubble study). There was holodiastolic flow reversal in the ascending aorta and partial diastolic flow reversal in the descending thoracic aorta.  Impression: Severe aortic insufficiency.   02/06/16 Echo (TTE): Study Conclusions - Left ventricle: The cavity size was normal. Wall thickness was  normal. Systolic function was moderately reduced. The estimated  ejection fraction was in the range of 35% to 40%. Diffuse  hypokinesis. There was fusion of early and atrial contributions  to ventricular filling. The study is not technically sufficient  to allow evaluation of LV diastolic function. - Aortic valve: Moderately calcified annulus. Mild diffuse  calcification. There was moderate to severe regurgitation. - Mitral valve: There was mild regurgitation. - Left atrium: The atrium was severely dilated. - Tricuspid valve: There was mild regurgitation. - Atrial septum: No defect or patent foramen ovale was identified. - Pulmonary arteries: PA peak pressure: 45 mm Hg (S). Impressions: - The right ventricular systolic pressure was increased consistent  with moderate pulmonary hypertension.  10/16/15 CTA head/neck: IMPRESSION: 1. No large vessel occlusion. 2. No significant anterior circulation stenosis. 3. Mild left P1 PCA stenosis. 4. Widely patent cervical carotid arteries. 5. Mild asymmetric enlargement of the right palatine tonsil. Correlate with direct visualization.  02/05/16 CXR: IMPRESSION: No acute cardiopulmonary process.  Preoperative labs noted. LFT, PT/INR, PLT WNL. Cr. 0.90. H/H 11.7/36.0. Glucose 80.   Further evaluation on the day of surgery to ensure no acute changes prior to proceeding.  George Hugh [facility] Stay Center/Anesthesiology Phone [phone] 02/27/2016 4:17 PM

## 2016-02-29 LAB — ECHO TEE: AVPHT: 234 ms

## 2016-03-01 ENCOUNTER — Encounter (HOSPITAL_COMMUNITY): Payer: Self-pay | Admitting: *Deleted

## 2016-03-01 ENCOUNTER — Ambulatory Visit (HOSPITAL_COMMUNITY): Payer: Medicare HMO | Admitting: Certified Registered Nurse Anesthetist

## 2016-03-01 ENCOUNTER — Ambulatory Visit (HOSPITAL_COMMUNITY)
Admission: RE | Admit: 2016-03-01 | Discharge: 2016-03-01 | Disposition: A | Discharge status: Home / Self Care | Payer: Medicare HMO | Source: Ambulatory Visit | Attending: Dentistry | Admitting: Dentistry

## 2016-03-01 ENCOUNTER — Encounter (HOSPITAL_COMMUNITY)
Admission: RE | Disposition: A | Discharge status: Home / Self Care | Payer: Self-pay | Source: Ambulatory Visit | Attending: Dentistry

## 2016-03-01 ENCOUNTER — Ambulatory Visit (HOSPITAL_COMMUNITY): Payer: Medicare HMO | Admitting: Vascular Surgery

## 2016-03-01 DIAGNOSIS — Z87891 Personal history of nicotine dependence: Secondary | ICD-10-CM | POA: Insufficient documentation

## 2016-03-01 DIAGNOSIS — E46 Unspecified protein-calorie malnutrition: Secondary | ICD-10-CM | POA: Insufficient documentation

## 2016-03-01 DIAGNOSIS — I351 Nonrheumatic aortic (valve) insufficiency: Secondary | ICD-10-CM | POA: Insufficient documentation

## 2016-03-01 DIAGNOSIS — Z79899 Other long term (current) drug therapy: Secondary | ICD-10-CM | POA: Insufficient documentation

## 2016-03-01 DIAGNOSIS — Z7982 Long term (current) use of aspirin: Secondary | ICD-10-CM | POA: Insufficient documentation

## 2016-03-01 DIAGNOSIS — Z6820 Body mass index (BMI) 20.0-20.9, adult: Secondary | ICD-10-CM | POA: Insufficient documentation

## 2016-03-01 DIAGNOSIS — K0530 Chronic periodontitis, unspecified: Secondary | ICD-10-CM

## 2016-03-01 DIAGNOSIS — Z8673 Personal history of transient ischemic attack (TIA), and cerebral infarction without residual deficits: Secondary | ICD-10-CM | POA: Insufficient documentation

## 2016-03-01 DIAGNOSIS — K036 Deposits [accretions] on teeth: Secondary | ICD-10-CM

## 2016-03-01 DIAGNOSIS — K083 Retained dental root: Secondary | ICD-10-CM | POA: Insufficient documentation

## 2016-03-01 DIAGNOSIS — R0789 Other chest pain: Secondary | ICD-10-CM

## 2016-03-01 DIAGNOSIS — K029 Dental caries, unspecified: Secondary | ICD-10-CM

## 2016-03-01 DIAGNOSIS — I110 Hypertensive heart disease with heart failure: Secondary | ICD-10-CM | POA: Insufficient documentation

## 2016-03-01 DIAGNOSIS — R079 Chest pain, unspecified: Secondary | ICD-10-CM

## 2016-03-01 DIAGNOSIS — I5042 Chronic combined systolic (congestive) and diastolic (congestive) heart failure: Secondary | ICD-10-CM | POA: Insufficient documentation

## 2016-03-01 DIAGNOSIS — K045 Chronic apical periodontitis: Secondary | ICD-10-CM | POA: Insufficient documentation

## 2016-03-01 HISTORY — PX: MULTIPLE EXTRACTIONS WITH ALVEOLOPLASTY: SHX5342

## 2016-03-01 SURGERY — MULTIPLE EXTRACTION WITH ALVEOLOPLASTY
Anesthesia: General

## 2016-03-01 MED ORDER — HYDROMORPHONE HCL 1 MG/ML IJ SOLN
0.2500 mg | INTRAMUSCULAR | Status: DC | PRN
  Administered 2016-03-01 (×3): 0.5 mg via INTRAVENOUS

## 2016-03-01 MED ORDER — PROPOFOL 10 MG/ML IV BOLUS
INTRAVENOUS | Status: DC | PRN
  Administered 2016-03-01: 100 mg via INTRAVENOUS

## 2016-03-01 MED ORDER — FENTANYL CITRATE (PF) 250 MCG/5ML IJ SOLN
INTRAMUSCULAR | Status: AC
Start: 2016-03-01 — End: 2016-03-01
  Filled 2016-03-01: qty 5

## 2016-03-01 MED ORDER — CARVEDILOL 3.125 MG PO TABS
3.1250 mg | ORAL_TABLET | Freq: Once | ORAL | Status: AC
  Administered 2016-03-01: 3.125 mg via ORAL
  Filled 2016-03-01: qty 1

## 2016-03-01 MED ORDER — 0.9 % SODIUM CHLORIDE (POUR BTL) OPTIME
TOPICAL | Status: DC | PRN
  Administered 2016-03-01: 1000 mL

## 2016-03-01 MED ORDER — OXYCODONE-ACETAMINOPHEN 5-325 MG PO TABS: ORAL_TABLET | ORAL | Status: DC

## 2016-03-01 MED ORDER — INSULIN ASPART 100 UNIT/ML ~~LOC~~ SOLN
SUBCUTANEOUS | Status: AC
  Filled 2016-03-01: qty 5

## 2016-03-01 MED ORDER — BUPIVACAINE-EPINEPHRINE (PF) 0.5% -1:200000 IJ SOLN
INTRAMUSCULAR | Status: AC
  Filled 2016-03-01: qty 3.6

## 2016-03-01 MED ORDER — CARVEDILOL 3.125 MG PO TABS
ORAL_TABLET | ORAL | Status: AC
  Filled 2016-03-01: qty 1

## 2016-03-01 MED ORDER — LIDOCAINE-EPINEPHRINE 2 %-1:100000 IJ SOLN
INTRAMUSCULAR | Status: DC | PRN
  Administered 2016-03-01: 3.4 mL

## 2016-03-01 MED ORDER — OXYCODONE-ACETAMINOPHEN 5-325 MG PO TABS
ORAL_TABLET | ORAL | Status: AC
  Administered 2016-03-01: 1 via ORAL
  Filled 2016-03-01: qty 2

## 2016-03-01 MED ORDER — MIDAZOLAM HCL 2 MG/2ML IJ SOLN
INTRAMUSCULAR | Status: AC
  Filled 2016-03-01: qty 2

## 2016-03-01 MED ORDER — BUPIVACAINE-EPINEPHRINE 0.5% -1:200000 IJ SOLN
INTRAMUSCULAR | Status: DC | PRN
  Administered 2016-03-01: 10.8 mL

## 2016-03-01 MED ORDER — SUGAMMADEX SODIUM 200 MG/2ML IV SOLN
INTRAVENOUS | Status: DC | PRN
  Administered 2016-03-01: 110 mg via INTRAVENOUS

## 2016-03-01 MED ORDER — ROCURONIUM BROMIDE 100 MG/10ML IV SOLN
INTRAVENOUS | Status: DC | PRN
  Administered 2016-03-01: 50 mg via INTRAVENOUS

## 2016-03-01 MED ORDER — OXYMETAZOLINE HCL 0.05 % NA SOLN
NASAL | Status: AC
  Filled 2016-03-01: qty 15

## 2016-03-01 MED ORDER — CEFAZOLIN SODIUM-DEXTROSE 2-4 GM/100ML-% IV SOLN
2.0000 g | Freq: Once | INTRAVENOUS | Status: AC
  Administered 2016-03-01: 2 g via INTRAVENOUS

## 2016-03-01 MED ORDER — OXYCODONE-ACETAMINOPHEN 5-325 MG PO TABS
1.0000 | ORAL_TABLET | Freq: Once | ORAL | Status: AC
  Administered 2016-03-01: 1 via ORAL

## 2016-03-01 MED ORDER — OXYCODONE-ACETAMINOPHEN 5-325 MG PO TABS
1.0000 | ORAL_TABLET | Freq: Four times a day (QID) | ORAL | Status: DC | PRN
  Administered 2016-03-01: 1 via ORAL

## 2016-03-01 MED ORDER — PROPOFOL 10 MG/ML IV BOLUS
INTRAVENOUS | Status: AC
  Filled 2016-03-01: qty 20

## 2016-03-01 MED ORDER — HYDROMORPHONE HCL 1 MG/ML IJ SOLN
INTRAMUSCULAR | Status: AC
  Administered 2016-03-01: 0.5 mg via INTRAVENOUS
  Filled 2016-03-01: qty 1

## 2016-03-01 MED ORDER — LACTATED RINGERS IV SOLN
INTRAVENOUS | Status: DC | PRN
  Administered 2016-03-01: 07:00:00 via INTRAVENOUS

## 2016-03-01 MED ORDER — MEPERIDINE HCL 25 MG/ML IJ SOLN: 6.2500 mg | INTRAMUSCULAR | Status: DC | PRN

## 2016-03-01 MED ORDER — FENTANYL CITRATE (PF) 100 MCG/2ML IJ SOLN
INTRAMUSCULAR | Status: DC | PRN
  Administered 2016-03-01: 100 ug via INTRAVENOUS
  Administered 2016-03-01 (×2): 50 ug via INTRAVENOUS

## 2016-03-01 MED ORDER — LIDOCAINE HCL (CARDIAC) 20 MG/ML IV SOLN
INTRAVENOUS | Status: DC | PRN
  Administered 2016-03-01: 100 mg via INTRAVENOUS

## 2016-03-01 MED ORDER — ONDANSETRON HCL 4 MG/2ML IJ SOLN: 4.0000 mg | Freq: Once | INTRAMUSCULAR | Status: DC | PRN

## 2016-03-01 MED ORDER — LIDOCAINE HCL 4 % EX SOLN
CUTANEOUS | Status: DC | PRN
  Administered 2016-03-01: 2 mL via TOPICAL

## 2016-03-01 MED ORDER — ONDANSETRON HCL 4 MG/2ML IJ SOLN
INTRAMUSCULAR | Status: DC | PRN
  Administered 2016-03-01: 4 mg via INTRAVENOUS

## 2016-03-01 MED ORDER — HYDROMORPHONE HCL 1 MG/ML IJ SOLN
INTRAMUSCULAR | Status: AC
  Filled 2016-03-01: qty 1

## 2016-03-01 MED ORDER — LIDOCAINE-EPINEPHRINE 2 %-1:100000 IJ SOLN
INTRAMUSCULAR | Status: AC
  Filled 2016-03-01: qty 10.2

## 2016-03-01 MED ORDER — MIDAZOLAM HCL 5 MG/5ML IJ SOLN
INTRAMUSCULAR | Status: DC | PRN
  Administered 2016-03-01 (×2): 1 mg via INTRAVENOUS

## 2016-03-01 SURGICAL SUPPLY — 37 items
ALCOHOL 70% 16 OZ (MISCELLANEOUS) ×3 IMPLANT
ATTRACTOMAT 16X20 MAGNETIC DRP (DRAPES) ×3 IMPLANT
BLADE SURG 15 STRL LF DISP TIS (BLADE) ×2 IMPLANT
BLADE SURG 15 STRL SS (BLADE) ×4
COVER SURGICAL LIGHT HANDLE (MISCELLANEOUS) ×3 IMPLANT
GAUZE PACKING FOLDED 2  STR (GAUZE/BANDAGES/DRESSINGS) ×2
GAUZE PACKING FOLDED 2 STR (GAUZE/BANDAGES/DRESSINGS) ×1 IMPLANT
GAUZE SPONGE 4X4 16PLY XRAY LF (GAUZE/BANDAGES/DRESSINGS) ×6 IMPLANT
GLOVE BIOGEL PI IND STRL 6 (GLOVE) ×2 IMPLANT
GLOVE BIOGEL PI INDICATOR 6 (GLOVE) ×4
GLOVE SURG ORTHO 8.0 STRL STRW (GLOVE) ×3 IMPLANT
GLOVE SURG SS PI 6.0 STRL IVOR (GLOVE) ×6 IMPLANT
GOWN STRL REUS W/ TWL LRG LVL3 (GOWN DISPOSABLE) ×1 IMPLANT
GOWN STRL REUS W/TWL 2XL LVL3 (GOWN DISPOSABLE) ×3 IMPLANT
GOWN STRL REUS W/TWL LRG LVL3 (GOWN DISPOSABLE) ×2
HEMOSTAT SURGICEL 2X14 (HEMOSTASIS) IMPLANT
KIT BASIN OR (CUSTOM PROCEDURE TRAY) ×3 IMPLANT
KIT ROOM TURNOVER OR (KITS) ×3 IMPLANT
MANIFOLD NEPTUNE WASTE (CANNULA) ×3 IMPLANT
NEEDLE BLUNT 16X1.5 OR ONLY (NEEDLE) ×3 IMPLANT
NEEDLE DENTAL 27 LONG (NEEDLE) ×6 IMPLANT
NS IRRIG 1000ML POUR BTL (IV SOLUTION) ×3 IMPLANT
PACK EENT II TURBAN DRAPE (CUSTOM PROCEDURE TRAY) ×3 IMPLANT
PAD ARMBOARD 7.5X6 YLW CONV (MISCELLANEOUS) ×3 IMPLANT
SPONGE SURGIFOAM ABS GEL 100 (HEMOSTASIS) ×3 IMPLANT
SPONGE SURGIFOAM ABS GEL 12-7 (HEMOSTASIS) IMPLANT
SPONGE SURGIFOAM ABS GEL SZ50 (HEMOSTASIS) IMPLANT
SUCTION FRAZIER HANDLE 10FR (MISCELLANEOUS) ×2
SUCTION TUBE FRAZIER 10FR DISP (MISCELLANEOUS) ×1 IMPLANT
SUT CHROMIC 3 0 PS 2 (SUTURE) ×9 IMPLANT
SUT CHROMIC 4 0 P 3 18 (SUTURE) IMPLANT
SYR 50ML SLIP (SYRINGE) ×3 IMPLANT
TOWEL OR 17X26 10 PK STRL BLUE (TOWEL DISPOSABLE) ×3 IMPLANT
TUBE CONNECTING 12'X1/4 (SUCTIONS) ×1
TUBE CONNECTING 12X1/4 (SUCTIONS) ×2 IMPLANT
WATER TABLETS ICX (MISCELLANEOUS) ×3 IMPLANT
YANKAUER SUCT BULB TIP NO VENT (SUCTIONS) ×3 IMPLANT

## 2016-03-01 NOTE — Progress Notes (Signed)
Pt with elevated BP in 180's over 50-60's, Spoke with Dr. Al Corpus regarding pts medication regimen and lack of Lasix today.  Okay to discharge home with instructions to take lasix as soon as patient arrives home, discharge instructions given to pt and niece.

## 2016-03-01 NOTE — Transfer of Care (Signed)
Immediate Anesthesia Transfer of Care Note  Patient: Alexis [name]  Procedure(s) Performed: Procedure(s): Extraction of tooth #'s [id number] with alveoloplasty and gross debridement of remaining teeth. (N/A)  Patient Location: PACU  Anesthesia Type:General  Level of Consciousness: awake, alert  and oriented  Airway & Oxygen Therapy: Patient Spontanous Breathing and Patient connected to nasal cannula oxygen  Post-op Assessment: Report given to RN and Post -op Vital signs reviewed and stable  Post vital signs: Reviewed and stable  Last Vitals:  Filed Vitals:   03/01/16 0624  BP: 186/56  Pulse: 69  Temp: 36.8 C  Resp: 18    Last Pain:  Filed Vitals:   03/01/16 0641  PainSc: 9       Patients Stated Pain Goal: 3 ([id number])  Complications: No apparent anesthesia complications

## 2016-03-01 NOTE — Progress Notes (Signed)
Patient ID: Alexis [name], male   DOB: [birth year], 65 y.o.   MRN: [id number] Patient examined chart reviewed Seen in PACU See full note by PA Patient currently with no chest pain Has a lot of right jaw pain from dental procedure Exam with swollen jaw and recent dental procedure AR murmur Right groin healed with previous hematoma ECG no acute changes Reviewed his angio from 02/09/16 and he has clean coronary arteries Dr Darcus Pester notes indicate 99% OM3 but this is a tiny "twig" of a vessel In most distal aspect of circumflex and not clinically significant  Ok to d/c home  If he needs admission for pain control of jaw post dental procedure can be Admitted by hospitalist.  Jenkins Rouge

## 2016-03-01 NOTE — Progress Notes (Signed)
RR elevated, BP 170/58, pt complaining of chest pain, scheduled for heart surgery next month, Dr. Conrad Crab Orchard made aware order for 12 lead EKG, will continue to monitor closely

## 2016-03-01 NOTE — Consult Note (Signed)
CARDIOLOGY CONSULT NOTE   Patient ID: Alexis [name] MRN: [id number], DOB/AGE: Nov 25, 1950   Admit date: 03/01/2016 Date of Consult: 03/01/2016   Primary Physician: [name], MD Primary Cardiologist: Dr. Stanford Breed  Pt. Profile  Mr. 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 Chest pain resolved quickly, now only concern is R jaw pain after dental procedure.   Problem List  Past Medical History  Diagnosis Date  . Hepatitis C   . Hypertension   . Headache   . Chronic systolic CHF (congestive heart failure) ([place])   . Severe aortic regurgitation   . Mitral regurgitation   . Tricuspid regurgitation   . Stroke (cerebrum) ([place]) 10/2015  . Protein calorie malnutrition ([place])   . Nephrolithiasis   . Seizure ([place])   . Shortness of breath dyspnea     LYING FLAT   . Depression   . GERD (gastroesophageal reflux disease)     Past Surgical History  Procedure Laterality Date  . Rotator cuff repair    . Cardiac catheterization N/A 02/09/2016    Procedure: Right/Left Heart Cath and Coronary Angiography;  Surgeon: [name], MD;  Location: [place] [facility];  Service: Cardiovascular;  Laterality: N/A;  . Tee without cardioversion N/A 02/11/2016    Procedure: TRANSESOPHAGEAL ECHOCARDIOGRAM (TEE);  Surgeon: [name], MD;  Location: [facility];  Service: Cardiovascular;  Laterality: N/A;  . Fracture surgery      RT ARM  . Legs      BIL FX   . 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 Echocardiogram obtained on 10/16/2015 showed EF 40-45%,  diffuse hypokinesis, severe AR, moderate MR, moderate TR, PA peak pressure 81 mgHg. He was admitted on [id number] for acute systolic heart failure after experiencing worsening dyspnea on exertion, orthopnea and PND for several months. Review of the previous echo showed EF appears to be around 35-40% with significant aortic insufficiency. His BNP was greater than 4500 on arrival. His blood pressure was poorly controlled. Echocardiogram repeated on 6/2 confirms EF 35-40%, severe AR, mild MR, severely dilated left atrium, PA peak pressure 45 mmHg. He underwent left and right heart cath on 02/09/2016 which showed normal coronaries with only one notable lesion in a very small caliber third OM branch with 90% stenosis. Post procedure was complicated by a small pseudoaneurysm in the right groin measuring 1.13 cm. He underwent TEE on 02/11/2016 which confirms severe AI. He was seen by Dr. [name] on 02/17/2016, surgical options were discussed and he agreed to undergo AVR with bioprosthetic tissue valve. In order to take care of some personal issues, patient wished to delay the surgery until 03/18/2016.  I saw him back in the office on 02/20/2016, at which time he was doing well on the heart failure perspective. His blood pressure was uncontrolled on office visit, therefore I added a low-dose carvedilol 3.125 mg twice a day. Since I saw him, he says he lost another 4-5 pounds. He did have 2 episodes of chest discomfort in the last 10  days, however they were very transient. He denies any recent shortness of breath. Today, he presents for presurgical dental extraction, he had 7 teeth extracted along with gross debridement. Post procedure, he complained of significant right-sided jaw pain. He also complaining of shortness of breath, weakness, dizziness and brief episode of chest discomfort. EKG was obtained which did not reveal significant ST-T wave changes. Cardiology has been converted for chest pain.  Since I saw him 10 days ago in  the office, he is noticeably weaker, granted he received multiple doses of Dilaudid due to dental procedure. Last dose of Dilaudid was given about a hour and half before I saw him.     Inpatient Medications  . carvedilol      . HYDROmorphone      . oxymetazoline        Family History Family History  Problem Relation Age of Onset  . Breast cancer Mother   . Aneurysm Mother     Brain aneurysm  . Hypertension Father   . Cancer Father     Pancreatic cancer  . Hypertension Brother      Social History Social History   Social History  . Marital Status: Married    Spouse Name: N/A  . Number of Children: 3  . Years of Education: N/A   Occupational History  . Retired     [employer]   Social History Main Topics  . Smoking status: Former Smoker -- 0.50 packs/day for 6 years    Types: Cigarettes    Quit date: 09/07/1971  . Smokeless tobacco: Former Systems developer    Types: Chew  . Alcohol Use: 0.0 oz/week    0 Standard drinks or equivalent per week     Comment: occl  . Drug Use: Yes    Special: Marijuana     Comment: THC      LAST USED WAS LAST MONTH    . Sexual Activity: Not on file   Other Topics Concern  . Not on file   Social History Narrative     Review of Systems  General:  No chills, fever, night sweats or weight changes.  Cardiovascular:  No dyspnea on exertion, edema, orthopnea, palpitations, paroxysmal nocturnal dyspnea. +chest pain Dermatological: No rash, lesions/masses Respiratory: No cough  +dyspnea Urologic: No hematuria, dysuria Abdominal:   No nausea, vomiting, diarrhea, bright red blood per rectum, melena, or hematemesis Neurologic:  No visual changes, changes in mental status. +wkns, dizziness All other systems reviewed and are otherwise negative except as noted above.  Physical Exam  Blood pressure 150/55, pulse 61, temperature 97.6 F (36.4 C), temperature source Oral, resp. rate 12, height 5\' 4"  (1.626 m), weight 116 lb 14.2 oz (53.02 kg), SpO2 99 %.   General: Pleasant, NAD Psych: Normal affect. Neuro: Alert and oriented X 3. Moves all extremities spontaneously. HEENT: Normal  Neck: Supple without bruits or JVD. Lungs:  Resp regular and unlabored, CTA. Heart: RRR no s3, s4, or murmurs. Abdomen: Soft, non-tender, non-distended, BS + x 4.  Extremities: No clubbing, cyanosis or edema. DP/PT/Radials 2+ and equal bilaterally.  Labs  No results for input(s): CKTOTAL, CKMB, TROPONINI in the last 72 hours. Lab Results  Component Value Date   WBC 4.6 02/27/2016   HGB 11.7* 02/27/2016   HCT 36.0* 02/27/2016   MCV 90.9 02/27/2016   PLT 269 02/27/2016    Recent Labs Lab 02/27/16 1416  NA 140  K 3.7  CL 111  CO2 21*  BUN 9  CREATININE 0.90  CALCIUM 9.2  PROT 7.1  BILITOT 0.9  ALKPHOS 75  ALT 21  AST 27  GLUCOSE 80   Lab Results  Component Value Date   CHOL 159 10/16/2015   HDL 49 10/16/2015   LDLCALC 102* 10/16/2015   TRIG 41 10/16/2015   No results found for: DDIMER  Radiology/Studies  Dg Chest 2 View  02/05/2016  CLINICAL DATA:  Patient with shortness of breath, worsening for multiple months. EXAM: CHEST  2 VIEW COMPARISON:  Chest CT 10/15/2015; chest radiograph 10/15/2015. FINDINGS: Stable cardiac no consolidative pulmonary opacities. No pleural effusion or pneumothorax. Postsurgical changes proximal right humerus. Thoracic spine degenerative changes. IMPRESSION: No acute cardiopulmonary process. Electronically Signed   By: Lovey Newcomer M.D.   On: 02/05/2016 13:13    ECG  NSR without significant ST-T wave changes  ASSESSMENT AND PLAN  1. Chest pain  - previous cath report reviewed, only very small caliber OM3 lesion, otherwise, clean coronaries. Cath film reviewed with Dr. Johnsie Cancel  - EKG without significant ST-T wave changes. No cardiac workup needed for CP. His CP has resolved, he is more concerned of his R jaw pain as result of dental procedure.    2. SOB with weakness and dizziness  - likely related to  peri-procedural sedation and pain. If continue to have weakness, recommend observation by hospitalist, otherwise, if symptom improve, can be discharged.   3. Severe AI pending upcoming AVR  4. Chronic systolic HF: euvolemic on physical exam  5. HTN  6. Recent CVA in 10/2015 with R frontal lobe periventricular infarct 2/2 small vessel disease  7. Pseudoaneurysm post cath: largely resolved, still has a hard contusion,   Signed, Almyra Deforest, PA-C 03/01/2016, 12:46 PM  See separate progress note from me Pain resolved recent cath with normal coronary arteries 02/09/16 No ECG changes Routine post op dental pain control per Dr Tanda Rockers

## 2016-03-01 NOTE — Discharge Instructions (Signed)

## 2016-03-01 NOTE — Progress Notes (Signed)
Pt up to go to bathroom, very winded after walking, Dr. Johnsie Cancel by to see patient and exam performed, will give oral pain med and discharge home when patient ready per anesthesia.

## 2016-03-01 NOTE — Anesthesia Preprocedure Evaluation (Addendum)
Anesthesia Evaluation  Patient identified by MRN, date of birth, ID band Patient awake    Reviewed: Allergy & Precautions, NPO status , Patient's Chart, lab work & pertinent test results  Airway Mallampati: II   Neck ROM: Full    Dental  (+) Poor Dentition,    Pulmonary shortness of breath and with exertion, former smoker,    Pulmonary exam normal breath sounds clear to auscultation       Cardiovascular Exercise Tolerance: Good hypertension, Pt. on medications +CHF  Normal cardiovascular exam+ Valvular Problems/Murmurs AI  Rhythm:Regular     Neuro/Psych  Headaches, Seizures -, Well Controlled,  Anxiety Depression CVA, No Residual Symptoms    GI/Hepatic GERD  Controlled and Medicated,(+) Hepatitis -, C  Endo/Other    Renal/GU      Musculoskeletal   Abdominal (+)  Abdomen: soft.    Peds  Hematology   Anesthesia Other Findings   Reproductive/Obstetrics                           Anesthesia Physical Anesthesia Plan  ASA: IV  Anesthesia Plan: General   Post-op Pain Management:    Induction: Intravenous  Airway Management Planned: Nasal ETT  Additional Equipment:   Intra-op Plan:   Post-operative Plan: Extubation in OR  Informed Consent: I have reviewed the patients History and Physical, chart, labs and discussed the procedure including the risks, benefits and alternatives for the proposed anesthesia with the patient or authorized representative who has indicated his/her understanding and acceptance.   Dental advisory given  Plan Discussed with: Surgeon and CRNA  Anesthesia Plan Comments:        Anesthesia Quick Evaluation

## 2016-03-01 NOTE — H&P (Signed)
03/01/2016  Patient:            Alexis [name] Date of Birth:  June 27, 1951 MRN:                [id number]   BP 186/56 mmHg  Pulse 69  Temp(Src) 98.3 F (36.8 C) (Oral)  Resp 18  Ht 5' 4"  (1.626 m)  Wt 116 lb 14.2 oz (53.02 kg)  BMI 20.05 kg/m2  SpO2 100%  Alexis [name] os a 65 yo male with severe aortic valve disease with anticipated AVR. Patient now presents for multiple extractions with alveoloplasty with gross debridement in the OR with general anesthesia.  Patient denies acute medical or dental changes. Please use Dr. Guy Sandifer note of 02/17/16 for use as the H and P for the dental OR procedure.  Dr. Enrique Sack  CARDIOTHORACIC SURGERY CONSULTATION REPORT  Referring Provider is [name], MD  Primary Cardiologist is Stanford Breed [name], MD PCP is [name], MD  Chief Complaint  Patient presents with  . Aortic Insuffiency    severe AI, eval for AVR, review TEE    HPI:  Patient is a 65 year old African-American male with history of hypertension, hepatitis C, seizure disorder, and recent stroke who has been referred for surgical consultation to discuss treatment options for management of severe symptomatic aortic insufficiency with acute combined systolic and diastolic congestive heart failure. The patient is a service connected veteran and has been followed through the [place] medical system for many years. He is not certain about any previous diagnosis of heart valve disease, but he thinks that his primary care physician may have told him that he had a heart murmur in the past. He had never been formally evaluated by a cardiologist until recently. The patient presented acutely for hospital admission on 10/15/2015 with severe resting shortness of breath, cough, chest pain, headache, nausea, vomiting, and diarrhea. He described a 3 month history of anorexia and associated 35 pounds weight loss. He reports history of low-grade fevers and night sweats. Part to his hospital  admission he had been treated as an outpatient with oral antibiotics for a presumed urinary tract infection. The patient was diagnosed with acute hypoxic respiratory failure secondary to acute congestive heart failure with possible bronchitis. BNP level was elevated 2457. CT angiogram of the chest reveals an artifact in the left lower lobe pulmonary artery that was felt to be an artifact and not consistent with acute pulmonary embolus. There was cardiac enlargement and pulmonary edema. Transthoracic echocardiogram revealed severe aortic insufficiency with moderate global left ventricular systolic dysfunction, ejection fraction estimated 40-45%, severe pulmonary hypertension and moderate tricuspid regurgitation. Cardiology was not consulted. The patient's shortness of breath improved with medical therapy including antibiotics, bronchodilators, lisinopril, and Lasix. MRI of the brain was performed at the time of the patient's initial presentation in notable for a small acute infarct in the right frontal lobe. The patient had reportedly experienced a transient episode of leg weakness that had resolved. The patient also states he had some difficulty with speech. The patient was evaluated by neurology and a thorough search for possible source of embolization was recommended. CT angiogram of the head and neck was unrevealing. A transesophageal echocardiogram was not performed. Blood cultures were not obtained. The patient was discharged from the hospital on oral azithromycin for presumed bronchitis.  The patient was readmitted to the hospital 02/05/2016 with severe exertional shortness of breath, orthopnea, and PND. BNP level was greater than 4500. Follow-up transthoracic echocardiogram revealed severe aortic insufficiency.  There was at least moderate left ventricular systolic dysfunction with ejection fraction estimated 35-40%. The patient was evaluated by cardiology and treated with intravenous Lasix with good  response. He underwent left and right heart catheterization 02/09/2016 revealing essentially normal coronary arteries with 90% stenosis of a very small-caliber third obtuse marginal branch of the left circumflex and otherwise minimal nonobstructive disease. There was moderate to severe left ventricular systolic dysfunction with severe aortic insufficiency. There was mild pulmonary hypertension. The patient developed right groin pseudoaneurysm following that required ultrasound-guided thrombosis. Transesophageal echocardiogram was performed prior to hospital discharge and confirmed the presence of severe aortic insufficiency. No obvious vegetations were seen. The patient diuresed more than 20 pounds of fluid and experienced significant clinical improvement. He was discharged from the hospital with plans for elective cardiothoracic surgical consultation as an outpatient.  The patient is married but has been separated from his wife for several years. He lives alone in [place]. He has 3 adult children and several grandchildren. 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 He has been retired for approximately 5 years. Prior to becoming ill the patient states that he traveled fairly regularly, primarily to visit his family. He describes a 4-5 months history of anorexia, weight loss, low-grade fever, night sweats, and progressive shortness of breath. He has not had chest pain or chest tightness. He has not had dizzy spells or syncope. His breathing has been much better since his recent hospital discharge although he has gained some weight over the past week. He denies any productive cough. Orthopnea has improved. He does not have lower extremity edema. He still gets night sweats. He denies migratory arthritis or arthralgias.  Past Medical History  Diagnosis Date  . Hepatitis C   . Hypertension   . Headache   . Chronic systolic CHF (congestive heart failure) ([place])   .  Severe aortic regurgitation   . Mitral regurgitation   . Tricuspid regurgitation   . Stroke (cerebrum) ([place]) 10/2015  . Protein calorie malnutrition ([place])   . Nephrolithiasis   . Seizure [facility])     Past Surgical History  Procedure Laterality Date  . Fracture surgery    . Rotator cuff repair    . Cardiac catheterization N/A 02/09/2016    Procedure: Right/Left Heart Cath and Coronary Angiography; Surgeon: [name], MD; Location: [place] [facility] LAB; Service: Cardiovascular; Laterality: N/A;  . Tee without cardioversion N/A 02/11/2016    Procedure: TRANSESOPHAGEAL ECHOCARDIOGRAM (TEE); Surgeon: [name], MD; Location: [facility] ENDOSCOPY; Service: Cardiovascular; Laterality: N/A;    Family History  Problem Relation Age of Onset  . Breast cancer Mother   . Aneurysm Mother     Brain aneurysm  . Hypertension Father   . Cancer Father     Pancreatic cancer  . Hypertension Brother     Social History   Social History  . Marital Status: Married    Spouse Name: N/A  . Number of Children: 3  . Years of Education: N/A   Occupational History  . Not on file.   Social History Main Topics  . Smoking status: Former Research scientist (life sciences)  . Smokeless tobacco: Never Used  . Alcohol Use: 0.0 oz/week    0 Standard drinks or equivalent per week     Comment: occ  . Drug Use: Yes    Special: Marijuana     Comment: THC  . Sexual Activity: Not on file   Other Topics Concern  . Not on file  Social History Narrative    Current Outpatient Prescriptions  Medication Sig Dispense Refill  . aspirin EC 325 MG EC tablet Take 1 tablet (325 mg total) by mouth daily. 30 tablet 0  . atorvastatin (LIPITOR) 10 MG tablet Take 1 tablet (10 mg total) by mouth daily at 6 PM. 30 tablet 0  . feeding supplement, ENSURE ENLIVE, (ENSURE ENLIVE) LIQD  Take 237 mLs by mouth 3 (three) times daily between meals. 90 Bottle 0  . furosemide (LASIX) 40 MG tablet Take 1 tablet (40 mg total) by mouth daily. 30 tablet 0  . lisinopril (PRINIVIL,ZESTRIL) 40 MG tablet Take 1 tablet (40 mg total) by mouth daily. 30 tablet 0   No current facility-administered medications for this visit.    No Active Allergies    Review of Systems:  General:decreased appetite, decreased energy, + weight gain, + weight loss, + low grade fever Cardiac:no chest pain with exertion, no chest pain at rest, +SOB with exertion, + resting SOB, + PND, + orthopnea, no palpitations, no arrhythmia, no atrial fibrillation, no LE edema, no dizzy spells, no syncope Respiratory:+ shortness of breath, no home oxygen, no productive cough, + chronic dry cough, + possible bronchitis, no wheezing, no hemoptysis, no asthma, no pain with inspiration or cough, no sleep apnea, no CPAP at night UG:[id number] difficulty swallowing, no reflux, + frequent heartburn, no hiatal hernia, no abdominal pain, no constipation, no diarrhea, no hematochezia, no hematemesis, no melena GU:no dysuria, no frequency, no urinary tract infection, no hematuria, no enlarged prostate, no kidney stones, no kidney disease Vascular:no pain suggestive of claudication, no pain in feet, no leg cramps, no varicose veins, no DVT, no non-healing foot ulcer Neuro:+ recent stroke, no TIA's, + seizures, + headaches, no temporary blindness one eye, + slurred speech, no peripheral neuropathy, + chronic pain, no instability of gait, no memory/cognitive dysfunction Musculoskeletal:no arthritis, no joint swelling, no myalgias, no difficulty walking,  normal mobility  Skin:+ rash, no itching, no skin infections, no pressure sores or ulcerations Psych:+ anxiety, + depression, no nervousness, no unusual recent stress Eyes:+ blurry vision, no floaters, no recent vision changes, + wears glasses or contacts ENT:no hearing loss, no loose or painful teeth, no dentures, last saw dentist many years ago Hematologic:no easy bruising, no abnormal bleeding, no clotting disorder, + frequent epistaxis Endocrine:no diabetes, does not check CBG's at home   Physical Exam:  BP 155/70 mmHg  Pulse 76  Resp 19  Ht 5' 4"  (1.626 m)  Wt 121 lb (54.885 kg)  BMI 20.76 kg/m2  SpO2 98% General:Thin, well-appearing HEENT:Unremarkable  Neck:no JVD, no bruits, no adenopathy  Chest:clear to auscultation, symmetrical breath sounds, no wheezes, no rhonchi  CV:RRR, grade IV/VI holodiastolic murmur with water hammer pulse Abdomen:soft, non-tender, no masses  Extremities:warm, well-perfused, pulses diminished but palpable, no LE edema Rectal/GUDeferred Neuro:Grossly non-focal and symmetrical throughout Skin:Clean and dry, no rashes, no breakdown   Diagnostic Tests:  Transthoracic Echocardiography  Patient: Alexis [name], Alexis [name] MR #: [id number] Study Date: 10/16/2015 Gender: M Age: 64 Height: 162.6 cm Weight: 53.1 kg BSA: 1.55  m^2 Pt. Status: Room: 3E21C  SONOGRAPHER Lavella Lemons [id number] ATTENDING Billy Fischer, Erin PERFORMING [facility], Inpatient  cc:  ------------------------------------------------------------------- LV EF: 40% - 45%  ------------------------------------------------------------------- Indications: CHF - 428.0.  ------------------------------------------------------------------- History: PMH: Hepatits C. Headache. Transient ischemic attack. Risk factors: Hypertension.  ------------------------------------------------------------------- Study Conclusions  - Left ventricle: The cavity size was normal. There was moderate  concentric hypertrophy. Systolic function was mildly to  moderately reduced.  The estimated ejection fraction was in the  range of 40% to 45%. Diffuse hypokinesis. - Aortic valve: Trileaflet; mildly thickened, mildly calcified  leaflets. There was severe regurgitation. - Mitral valve: There was moderate regurgitation directed  posteriorly. - Right ventricle: The cavity size was mildly dilated. Wall  thickness was normal. - Right atrium: The atrium was moderately dilated. - Tricuspid valve: There was moderate regurgitation. - Pulmonary arteries: Systolic pressure was severely increased. PA  peak pressure: 81 mm Hg (S).  Transthoracic echocardiography. M-mode, complete 2D, spectral Doppler, and color Doppler. Birthdate: Patient birthdate: June 08, 1951. Age: Patient is 65 yr old. Sex: Gender: male. BMI: 20.1 kg/m^2. Blood pressure: 148/56 Patient status: Inpatient. Study date: Study date: 10/16/2015. Study time: 02:39 PM. Location: Bedside.  -------------------------------------------------------------------  ------------------------------------------------------------------- Left ventricle: The cavity size was normal. There was moderate concentric hypertrophy. Systolic function was  mildly to moderately reduced. The estimated ejection fraction was in the range of 40% to 45%. Diffuse hypokinesis.  ------------------------------------------------------------------- Aortic valve: Trileaflet; mildly thickened, mildly calcified leaflets. Mobility was not restricted. Doppler: Transvalvular velocity was within the normal range. There was no stenosis. There was severe regurgitation.  ------------------------------------------------------------------- Aorta: Aortic root: The aortic root was normal in size.  ------------------------------------------------------------------- Mitral valve: Mildly thickened leaflets . Mobility was not restricted. Doppler: Transvalvular velocity was within the normal range. There was no evidence for stenosis. There was moderate regurgitation directed posteriorly.  ------------------------------------------------------------------- Left atrium: The atrium was normal in size.  ------------------------------------------------------------------- Right ventricle: The cavity size was mildly dilated. Wall thickness was normal. Systolic function was normal.  ------------------------------------------------------------------- Pulmonic valve: Poorly visualized. Structurally normal valve. Cusp separation was normal. Doppler: Transvalvular velocity was within the normal range. There was no evidence for stenosis. There was no regurgitation.  ------------------------------------------------------------------- Tricuspid valve: Structurally normal valve. Doppler: Transvalvular velocity was within the normal range. There was moderate regurgitation.  ------------------------------------------------------------------- Pulmonary artery: The main pulmonary artery was normal-sized. Systolic pressure was severely increased.  ------------------------------------------------------------------- Right atrium: The atrium was  moderately dilated.  ------------------------------------------------------------------- Pericardium: There was no pericardial effusion.  ------------------------------------------------------------------- Systemic veins: Inferior vena cava: The vessel was dilated. The respirophasic diameter changes were blunted (< 50%), consistent with elevated central venous pressure.  ------------------------------------------------------------------- Measurements  Left ventricle Value Reference LV ID, ED, PLAX chordal (H) 57.9 mm 43 - 52 LV ID, ES, PLAX chordal (H) 44.5 mm 23 - 38 LV fx shortening, PLAX chordal (L) 23 % >=29 LV PW thickness, ED 12.7 mm --------- IVS/LV PW ratio, ED 1.18 <=1.3 LV e&', lateral 8.81 cm/s --------- LV e&', medial 9.79 cm/s --------- LV e&', average 9.3 cm/s ---------  Ventricular septum Value Reference IVS thickness, ED 15 mm ---------  LVOT Value Reference LVOT ID, S 25 mm --------- LVOT area 4.91 cm^2 ---------  Aortic valve Value Reference Aortic regurg peak velocity 173 cm/s --------- Aortic regurg pressure half-time 229 ms --------- Aortic regurg peak gradient 12 mm Hg ---------  Aorta Value Reference Aortic root ID, ED 34 mm ---------  Left atrium Value Reference LA ID, A-P,  ES 36 mm --------- LA ID/bsa, A-P (H) 2.33 cm/m^2 <=2.2 LA volume, S 137 ml --------- LA volume/bsa, S 88.6 ml/m^2 --------- LA volume, ES, 1-p A4C 125 ml --------- LA volume/bsa, ES, 1-p A4C 80.9 ml/m^2 --------- LA volume, ES, 1-p A2C 140 ml --------- LA volume/bsa, ES, 1-p A2C 90.6 ml/m^2 ---------  Mitral valve Value Reference Mitral maximal regurg velocity, 528 cm/s --------- PISA Mitral regurg VTI, PISA 162 cm --------- Mitral ERO, PISA 0.11 cm^2 ---------  Mitral regurg volume, PISA 18 ml ---------  Pulmonary arteries Value Reference PA pressure, S, DP (H) 81 mm Hg <=30  Tricuspid valve Value Reference Tricuspid regurg peak velocity 406 cm/s --------- Tricuspid peak RV-RA gradient 66 mm Hg ---------  Systemic veins Value Reference Estimated CVP 15 mm Hg ---------  Right ventricle Value Reference RV pressure, S, DP (H) 81 mm Hg <=30 RV s&', lateral, S 17.1 cm/s ---------  Legend: (L) and (H) mark values outside specified reference range.  ------------------------------------------------------------------- Prepared and Electronically Authenticated by  Candee Furbish, M.D. 2017-02-09T15:34:42   Transthoracic Echocardiography  Patient: Alexis [name], Alexis [name] MR #: [id number] Study Date: 02/06/2016 Gender:  M Age: 73 Height: 162.6 cm Weight: 48.4 kg BSA: 1.47 m^2 Pt. Status: Room: 2H27C  ADMITTING [place], Everlene Farrier D ATTENDING [place], Anand D ORDERING [place], [place] D REFERRING [place], [place] D PERFORMING [facility], Inpatient SONOGRAPHER Madelin Rear, RDCS  cc:  ------------------------------------------------------------------- LV EF: 35% - 40%  ------------------------------------------------------------------- Indications: CHF - 428.0.  ------------------------------------------------------------------- History: PMH: Mitral valve disease. Aortic valve disease. Risk factors: Hypertension.  ------------------------------------------------------------------- Study Conclusions  - Left ventricle: The cavity size was normal. Wall thickness was  normal. Systolic function was moderately reduced. The estimated  ejection fraction was in the range of 35% to 40%. Diffuse  hypokinesis. There was fusion of early and atrial contributions  to ventricular filling. The study is not technically sufficient  to allow evaluation of LV diastolic function. - Aortic valve: Moderately calcified annulus. Mild diffuse  calcification. There was moderate to severe regurgitation. - Mitral valve: There was mild regurgitation. - Left atrium: The atrium was severely dilated. - Atrial septum: No defect or patent foramen ovale was identified. - Pulmonary arteries: PA peak pressure: 45 mm Hg (S).  Impressions:  - The right ventricular systolic pressure was increased consistent  with moderate pulmonary hypertension.  Transthoracic echocardiography. M-mode, complete 2D, spectral Doppler, and color Doppler. Birthdate: Patient birthdate: Nov 25, 1950. Age: Patient is 65 yr old. Sex: Gender: male. BMI: 18.3 kg/m^2. Blood pressure: 151/56 Patient status: Inpatient. Study date: Study date: 02/06/2016. Study time:  11:34 AM. Location: ICU/CCU  -------------------------------------------------------------------  ------------------------------------------------------------------- Left ventricle: The cavity size was normal. Wall thickness was normal. Systolic function was moderately reduced. The estimated ejection fraction was in the range of 35% to 40%. Diffuse hypokinesis. There was fusion of early and atrial contributions to ventricular filling. The study is not technically sufficient to allow evaluation of LV diastolic function.  ------------------------------------------------------------------- Aortic valve: Moderately calcified annulus. Mild diffuse calcification. Doppler: There was moderate to severe regurgitation.  ------------------------------------------------------------------- Mitral valve: Structurally normal valve. Leaflet separation was normal. Doppler: Transvalvular velocity was within the normal range. There was no evidence for stenosis. There was mild regurgitation.  ------------------------------------------------------------------- Left atrium: The atrium was severely dilated.  ------------------------------------------------------------------- Atrial septum: No defect or patent foramen ovale was identified.  ------------------------------------------------------------------- Right ventricle: The cavity size was normal. Wall thickness was normal. Systolic function was normal.  ------------------------------------------------------------------- Pulmonic valve: Structurally normal valve. Cusp separation was normal. Doppler: Transvalvular velocity was within the normal range. There was no regurgitation.  ------------------------------------------------------------------- Tricuspid valve: Structurally normal valve. Leaflet separation was normal. Doppler: Transvalvular velocity was within the normal range. There was mild  regurgitation.  ------------------------------------------------------------------- Right atrium: The atrium was normal in size.  ------------------------------------------------------------------- Measurements  Left ventricle Value Reference LV ID, ED, PLAX chordal (H) 55.7 mm 43 - 52 LV ID, ES, PLAX chordal (H) 44.9 mm 23 - 38 LV fx shortening, PLAX chordal (L) 19 % >=29 LV PW thickness,  ED 8.13 mm --------- IVS/LV PW ratio, ED 1.08 <=1.3 LV ejection fraction, 1-p A4C 22 % --------- LV end-diastolic volume, 2-p 413 ml --------- LV end-systolic volume, 2-p 244 ml --------- LV ejection fraction, 2-p 23 % --------- Stroke volume, 2-p 58 ml --------- LV end-diastolic volume/bsa, 2-p 010 ml/m^2 --------- LV end-systolic volume/bsa, 2-p 272 ml/m^2 --------- Stroke volume/bsa, 2-p 39.4 ml/m^2 --------- LV e&', lateral 4.13 cm/s ---------  Ventricular septum Value Reference IVS thickness, ED 8.74 mm ---------  LVOT Value Reference LVOT ID, S 21 mm --------- LVOT area 3.46 cm^2 ---------  Aortic valve Value Reference Aortic regurg pressure half-time 298 ms ---------  Aorta Value Reference Aortic root ID, ED 34 mm ---------  Left atrium Value Reference LA ID, A-P, ES 31 mm  --------- LA ID/bsa, A-P 2.11 cm/m^2 <=2.2 LA volume, S 102 ml --------- LA volume/bsa, S 69.3 ml/m^2 --------- LA volume, ES, 1-p A4C 97.6 ml --------- LA volume/bsa, ES, 1-p A4C 66.3 ml/m^2 --------- LA volume, ES, 1-p A2C 99.1 ml --------- LA volume/bsa, ES, 1-p A2C 67.3 ml/m^2 ---------  Mitral valve Value Reference Mitral regurg VTI, PISA 198 cm --------- Mitral ERO, PISA 0.16 cm^2 --------- Mitral regurg volume, PISA 32 ml ---------  Pulmonary arteries Value Reference PA pressure, S, DP (H) 45 mm Hg <=30  Systemic veins Value Reference Estimated CVP 5 mm Hg ---------  Right ventricle Value Reference TAPSE 27 mm --------- RV s&', lateral, S 11.5 cm/s ---------  Legend: (L) and (H) mark values outside specified reference range.  ------------------------------------------------------------------- Prepared and Electronically Authenticated by  Fransico Him, MD 2017-06-02T15:27:21    CARDIAC CATHETERIZATION Procedures    Right/Left Heart Cath and Coronary Angiography    Conclusion     Essentially normal coronary arteries with only one notable lesion in a very small caliber third OM branch with 90% stenosis  There is moderate to severe left ventricular systolic dysfunction and moderate to severe aortic regurgitation by echocardiogram. Follow-up echocardiogram this coming weekend or later on this week.   Thankfully, would appear that the patient's symptoms  dyspnea and chest discomfort are not likely the result of significant coronary disease.  Continued evaluation of aortic insufficiency. He appears to be euvolemic.  Plan: Return to nursing unit for post catheterization care and TR band removal.  He appears to be adequately diuresed.  Defer further evaluation to cardiology consult primary service.    Glenetta Hew, M.D., M.S. Interventional Cardiologist   Pager # [phone] Phone # [phone] [address] [place], [place] [address]      Indications    Congestive dilated cardiomyopathy (HCC) [I42.0 (ICD-10-CM)]   Aortic regurgitation [I35.1 (ICD-10-CM)]    Technique and Indications    PCP: [name], MD REFERRING MD: [place] CARDIOLOGIST: New - Dr. Stanford Breed.  65 year old male with past medical history of recent CVA, hypertension, hepatitis C for evaluation of congestive heart failure. Patient was admitted in February 2017 with complaints of dyspnea. He was also noted to have weight loss. His BNP was elevated. Chest CT showed question filling defect felt likely artifact. There were small bilateral pleural effusions. He was also found to have a CVA by MRI. Echocardiogram revealed ejection fraction 40- 45%, moderate left ventricular hypertrophy, severe aortic insufficiency, biatrial enlargement, moderate tricuspid regurgitation and severely elevated pulmonary pressures. He was treated with antibiotics, bronchodilators, lisinopril and Lasix. Patient has chronic dyspnea on exertion. However over the last several months this has worsened. He also describes orthopnea and PND. He denies pedal edema. He has  had occasional bouts of chest pain for years not exertional. He does not have exertional chest pain. No syncope. No fevers but he has had subjective chills. He was seenby primary care and cardiology was asked to evaluate.  As part of his evaluation for aortic insufficiency, he is referred for right  left heart catheterization.   PROCEDURE Estimated blood loss <50 mL. There were no immediate complications during the procedure.  Time Out: Verified patient identification, verified procedure, site/side was marked, verified correct patient position, special equipment/implants available, medications/allergies/relevent history reviewed, required imaging and test results available. Performed.  During this procedure the patient is administered a total of Versed 3 mg and Fentanyl 75 mg to achieve and maintain moderate conscious sedation. The patient's heart rate, blood pressure, and oxygen saturation are monitored continuously during the procedure. The period of conscious sedation is 44 minutes, of which I was present face-to-face 100% of this time.  Access:  RIGHT Radial Artery: 6 Fr sheath -- Seldinger technique using Angiocath Micropuncture Kit 10 mL radial cocktail IA; 2500 Units IV Heparin  Right Common Femoral Vein: 7 Fr Sheath - Seldinger Technique..   Right Heart Catheterization: 7 Fr Gordy Councilman catheter advanced under fluoroscopy with balloon inflated to the RA, RV, then PCWP-PA for hemodynamic measurement.  Simultaneous FA & PA blood gases checked for SaO2% to calculate FICK CO/CI  Thermodilution Injections performed to calculate CO/CI  Simultaneous PCWP/LV & RV/LV pressures monitored with Angled Pigtail in LV.  Catheter removed completely out of the body with balloon deflated.  Left Heart Catheterization: 5Fr Catheters advanced or exchanged over a J-wire under direct fluoroscopic guidance into the ascending aorta; TIG 4.0 catheter advanced firsvv.0 Left & RIGHT Coronary Artery Cineangiography: TIG 4.0 Catheter  Right Coronary Artery, SVG-RCA & SVG-OM Cineangiography: n/a  Femoral Venous Sheath(s) removed in the PACU Holding Area with manual pressure for hemostasis.   Radial sheath removed in the Cardiac Catheterization lab with TR Band placed for hemostasis.  TR Band: 1350 Hours; 17  mL air  MEDICATIONS * 75 g IV fentanyl, 3 mgIV Versed * SQ Lidocaine 61m for radial, 18 mL for femoral vein * Radial Cocktail: 3 mg in verapamil 10 mL NS * Isovue Contrast: 70 * Heparin: 2500 Units    Coronary Findings    Dominance: Right   Left Main  . Vessel is large.     Left Anterior Descending   . First Diagonal Branch   The vessel is small in size.   . First Septal Branch   The vessel is small in size.   .Marland KitchenSecond Diagonal Branch   The vessel is moderate in size.   .Marland KitchenSecond Septal Branch   The vessel is small in size.   . Third Diagonal Branch   The vessel is small in size.   . Third Septal Branch   The vessel is small in size.     Left Circumflex   . Lateral First Obtuse Marginal Branch   The vessel is small in size.   . Third Obtuse Marginal Branch   The vessel is small in size.   . 3rd Mrg lesion, 99% stenosed. Diffuse. Feels very faintly to distal.     Right Coronary Artery  . Vessel is large. Vessel is angiographically normal. The vessel is tortuous.   . Acute Marginal Branch   The vessel is small in size.   . Right Posterior Atrioventricular Branch   The vessel is moderate in size.   . First Right  Posterolateral   The vessel is small in size.   Marland Kitchen Second Right Posterolateral   The vessel is moderate in size.   . Third Right Posterolateral   The vessel is large in size.       Right Heart Pressures Normal right heart cath pressures LV EDP is normal.    Left Heart    Left Ventricle There is moderate to severe left ventricular systolic dysfunction. EF roughly 35% by echo The ejection fraction could not be assess due to No LV gram.   Aortic Valve There is no aortic valve stenosis. The aortic valve is calcified. Moderate-severe by echo    Coronary Diagrams    Diagnostic Diagram            Implants    No implant  documentation for this case.    PACS Images    Show images for Cardiac catheterization     Link to Procedure Log    Procedure Log      Hemo Data       Most Recent Value   Fick Cardiac Output  3.69 L/min   Fick Cardiac Output Index  2.43 (L/min)/BSA   Thermal Cardiac Output  4.06 L/min   Thermal Cardiac Output Index  2.67 (L/min)/BSA   Mitral Mean Gradient  4.1 mmHg   Mitral Peak Gradient  0 mmHg   Mitral Valve Area Index  1.72 cm2/BSA   RA A Wave  6 mmHg   RA V Wave  5 mmHg   RA Mean  4 mmHg   RV Systolic Pressure  28 mmHg   RV Diastolic Pressure  3 mmHg   RV EDP  6 mmHg   PA Systolic Pressure  31 mmHg   PA Diastolic Pressure  13 mmHg   PA Mean  20 mmHg   PW A Wave  13 mmHg   PW V Wave  10 mmHg   PW Mean  10 mmHg   LV Systolic Pressure  007 mmHg   LV Diastolic Pressure  3 mmHg   LV EDP  8 mmHg   Arterial Occlusion Pressure Extended Systolic Pressure  121 mmHg   Arterial Occlusion Pressure Extended Diastolic Pressure  47 mmHg   Arterial Occlusion Pressure Extended Mean Pressure  76 mmHg   Left Ventricular Apex Extended Systolic Pressure  975 mmHg   Left Ventricular Apex Extended Diastolic Pressure  4 mmHg   Left Ventricular Apex Extended EDP Pressure  8 mmHg   TPVR Index  7.49 HRUI     Procedure: TEE  Sedation: Versed 5 mg IV, Fentanyl 50 mcg   Indication: Aortic insufficiency  Findings: Please see echo section for full report. Mildly dilated LV with mild LV hypertrophy. EF 35%, diffuse hypokinesis. Normal RV size and systolic function. Normal LA size with no LAA thrombus. Normal RA size. Trivial TR. Trivial MR. The aortic valve was trileaflet but leaflets appeared redundant. There was severe, eccentric aortic insufficiency. No aortic stenosis. Unfortunately, the TEE machine used does not  have 3D. The aortic root measured 3.6 cm and the ascending aorta was not dilated. No PFO/ASD (negative bubble study). There was holodiastolic flow reversal in the ascending aorta and partial diastolic flow reversal in the descending thoracic aorta.   Impression: Severe aortic insufficiency.   Loralie Champagne 02/11/2016    Impression:  Patient has stage D severe symptomatic aortic insufficiency. I have personally reviewed the patient's chart, transthoracic and transesophageal echocardiograms, CT angiogram of the chest, MRI of the brain, and  diagnostic cardiac catheterization. At the time the patient's most recent hospitalization he presented with acute combined systolic and diastolic congestive heart failure, New York Heart Association functional class IV. Symptoms improved quickly with diuretic therapy. His previous clinical presentation is somewhat suspicious for the possibility of subacute bacterial endocarditis, although blood cultures were not obtained when he was hospitalized in February, blood cultures obtained during his recent hospitalization were negative, and transesophageal echocardiogram did not reveal the presence of obvious vegetations. Transesophageal echocardiogram confirmed the presence of essentially wide open aortic insufficiency with possible perforation of at least one of the leaflets of the aortic valve. There is moderate left ventricular systolic dysfunction. Under the circumstances I favor proceeding with aortic valve replacement as soon as practical. The patient has not seen a dentist in many years and should be evaluated prior to surgery.   Plan:  The patient was counseled at length regarding treatment alternatives for management of severe aortic insufficiency including continued medical therapy versus proceeding with aortic valve replacement in the near future. The natural history of aortic insufficiency was reviewed, as was long term prognosis with medical therapy  alone. Surgical options were discussed at length including conventional surgical aortic valve replacement through either a full median sternotomy or using minimally invasive techniques. Discussion was held comparing the relative risks of mechanical valve replacement with need for lifelong anticoagulation versus use of a bioprosthetic tissue valve and the associated potential for late structural valve deterioration and failure. This discussion was placed in the context of the patient's particular circumstances, and as a result the patient specifically requests that their valve be replaced using a bioprosthetic tissue valve.   I have offered to schedule the patient for surgery as early as next week. For personal reasons the patient desires to wait until sometime in July so that he can get his personal affairs in order. We have discussed expectations for his postoperative convalescence, particularly given the fact that he lives alone. All of his questions have been addressed. We tentatively plan to proceed with surgery on Thursday, 03/18/2016. The patient has been advised to monitor his weight closely and look for signs of fluid retention such as increased shortness of breath, weight gain, and lower extremity edema. He will be referred to the dental clinic for evaluation as soon as practical. The patient will return for follow-up on 03/16/2016 prior to surgery.   I spent in excess of 90 minutes during the conduct of this office consultation and >50% of this time involved direct face-to-face encounter with the patient for counseling and/or coordination of their care.   Valentina Gu. [name], MD 02/17/2016 3:50 PM         CARDIOTHORACIC SURGERY CONSULTATION REPORT  Referring Provider is [name], MD  Primary Cardiologist is Stanford Breed [name], MD PCP is [name], MD  Chief Complaint  Patient presents with  . Aortic Insuffiency    severe AI, eval for AVR, review TEE     HPI:  Patient is a 65 year old African-American male with history of hypertension, hepatitis C, seizure disorder, and recent stroke who has been referred for surgical consultation to discuss treatment options for management of severe symptomatic aortic insufficiency with acute combined systolic and diastolic congestive heart failure. The patient is a service connected veteran and has been followed through the [place] medical system for many years. He is not certain about any previous diagnosis of heart valve disease, but he thinks that his primary care physician may have told him that he  had a heart murmur in the past. He had never been formally evaluated by a cardiologist until recently. The patient presented acutely for hospital admission on 10/15/2015 with severe resting shortness of breath, cough, chest pain, headache, nausea, vomiting, and diarrhea. He described a 3 month history of anorexia and associated 35 pounds weight loss. He reports history of low-grade fevers and night sweats. Part to his hospital admission he had been treated as an outpatient with oral antibiotics for a presumed urinary tract infection. The patient was diagnosed with acute hypoxic respiratory failure secondary to acute congestive heart failure with possible bronchitis. BNP level was elevated 2457. CT angiogram of the chest reveals an artifact in the left lower lobe pulmonary artery that was felt to be an artifact and not consistent with acute pulmonary embolus. There was cardiac enlargement and pulmonary edema. Transthoracic echocardiogram revealed severe aortic insufficiency with moderate global left ventricular systolic dysfunction, ejection fraction estimated 40-45%, severe pulmonary hypertension and moderate tricuspid regurgitation. Cardiology was not consulted. The patient's shortness of breath improved with medical therapy including antibiotics, bronchodilators, lisinopril, and Lasix. MRI of the brain was performed at the  time of the patient's initial presentation in notable for a small acute infarct in the right frontal lobe. The patient had reportedly experienced a transient episode of leg weakness that had resolved. The patient also states he had some difficulty with speech. The patient was evaluated by neurology and a thorough search for possible source of embolization was recommended. CT angiogram of the head and neck was unrevealing. A transesophageal echocardiogram was not performed. Blood cultures were not obtained. The patient was discharged from the hospital on oral azithromycin for presumed bronchitis.  The patient was readmitted to the hospital 02/05/2016 with severe exertional shortness of breath, orthopnea, and PND. BNP level was greater than 4500. Follow-up transthoracic echocardiogram revealed severe aortic insufficiency. There was at least moderate left ventricular systolic dysfunction with ejection fraction estimated 35-40%. The patient was evaluated by cardiology and treated with intravenous Lasix with good response. He underwent left and right heart catheterization 02/09/2016 revealing essentially normal coronary arteries with 90% stenosis of a very small-caliber third obtuse marginal branch of the left circumflex and otherwise minimal nonobstructive disease. There was moderate to severe left ventricular systolic dysfunction with severe aortic insufficiency. There was mild pulmonary hypertension. The patient developed right groin pseudoaneurysm following that required ultrasound-guided thrombosis. Transesophageal echocardiogram was performed prior to hospital discharge and confirmed the presence of severe aortic insufficiency. No obvious vegetations were seen. The patient diuresed more than 20 pounds of fluid and experienced significant clinical improvement. He was discharged from the hospital with plans for elective cardiothoracic surgical consultation as an outpatient.  The patient is married but has  been separated from his wife for several years. He lives alone in [place]. He has 3 adult children and several grandchildren. 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 He has been retired for approximately 5 years. Prior to becoming ill the patient states that he traveled fairly regularly, primarily to visit his family. He describes a 4-5 months history of anorexia, weight loss, low-grade fever, night sweats, and progressive shortness of breath. He has not had chest pain or chest tightness. He has not had dizzy spells or syncope. His breathing has been much better since his recent hospital discharge although he has gained some weight over the past week. He denies any productive cough. Orthopnea has improved. He does not have lower extremity edema. He still  gets night sweats. He denies migratory arthritis or arthralgias.  Past Medical History  Diagnosis Date  . Hepatitis C   . Hypertension   . Headache   . Chronic systolic CHF (congestive heart failure) ([place])   . Severe aortic regurgitation   . Mitral regurgitation   . Tricuspid regurgitation   . Stroke (cerebrum) ([place]) 10/2015  . Protein calorie malnutrition ([place])   . Nephrolithiasis   . Seizure [facility])     Past Surgical History  Procedure Laterality Date  . Fracture surgery    . Rotator cuff repair    . Cardiac catheterization N/A 02/09/2016    Procedure: Right/Left Heart Cath and Coronary Angiography; Surgeon: [name], MD; Location: [facility]; Service: Cardiovascular; Laterality: N/A;  . Tee without cardioversion N/A 02/11/2016    Procedure: TRANSESOPHAGEAL ECHOCARDIOGRAM (TEE); Surgeon: [name], MD; Location: [facility] ENDOSCOPY; Service: Cardiovascular; Laterality: N/A;    Family History  Problem Relation Age of Onset  . Breast cancer Mother   . Aneurysm Mother     Brain aneurysm  . Hypertension Father    . Cancer Father     Pancreatic cancer  . Hypertension Brother     Social History   Social History  . Marital Status: Married    Spouse Name: N/A  . Number of Children: 3  . Years of Education: N/A   Occupational History  . Not on file.   Social History Main Topics  . Smoking status: Former Research scientist (life sciences)  . Smokeless tobacco: Never Used  . Alcohol Use: 0.0 oz/week    0 Standard drinks or equivalent per week     Comment: occ  . Drug Use: Yes    Special: Marijuana     Comment: THC  . Sexual Activity: Not on file   Other Topics Concern  . Not on file   Social History Narrative    Current Outpatient Prescriptions  Medication Sig Dispense Refill  . aspirin EC 325 MG EC tablet Take 1 tablet (325 mg total) by mouth daily. 30 tablet 0  . atorvastatin (LIPITOR) 10 MG tablet Take 1 tablet (10 mg total) by mouth daily at 6 PM. 30 tablet 0  . feeding supplement, ENSURE ENLIVE, (ENSURE ENLIVE) LIQD Take 237 mLs by mouth 3 (three) times daily between meals. 90 Bottle 0  . furosemide (LASIX) 40 MG tablet Take 1 tablet (40 mg total) by mouth daily. 30 tablet 0  . lisinopril (PRINIVIL,ZESTRIL) 40 MG tablet Take 1 tablet (40 mg total) by mouth daily. 30 tablet 0   No current facility-administered medications for this visit.    No Active Allergies    Review of Systems:  General:decreased appetite, decreased energy, + weight gain, + weight loss, + low grade fever Cardiac:no chest pain with exertion, no chest pain at rest, +SOB with exertion, + resting SOB, + PND, + orthopnea, no palpitations, no arrhythmia, no atrial fibrillation, no LE edema, no dizzy spells, no syncope Respiratory:+ shortness of breath, no home oxygen, no productive cough, + chronic dry cough, + possible bronchitis,  no wheezing, no hemoptysis, no asthma, no pain with inspiration or cough, no sleep apnea, no CPAP at night YQ:MVHQIONGEX difficulty swallowing, no reflux, + frequent heartburn, no hiatal hernia, no abdominal pain, no constipation, no diarrhea, no hematochezia, no hematemesis, no melena GU:no dysuria, no frequency, no urinary tract infection, no hematuria, no enlarged prostate, no kidney stones, no kidney disease Vascular:no pain suggestive of claudication, no pain in feet, no leg cramps,  no varicose veins, no DVT, no non-healing foot ulcer Neuro:+ recent stroke, no TIA's, + seizures, + headaches, no temporary blindness one eye, + slurred speech, no peripheral neuropathy, + chronic pain, no instability of gait, no memory/cognitive dysfunction Musculoskeletal:no arthritis, no joint swelling, no myalgias, no difficulty walking, normal mobility  Skin:+ rash, no itching, no skin infections, no pressure sores or ulcerations Psych:+ anxiety, + depression, no nervousness, no unusual recent stress Eyes:+ blurry vision, no floaters, no recent vision changes, + wears glasses or contacts ENT:no hearing loss, no loose or painful teeth, no dentures, last saw dentist many years ago Hematologic:no easy bruising, no abnormal bleeding, no clotting disorder, + frequent epistaxis Endocrine:no diabetes, does not check CBG's at home   Physical Exam:  BP 155/70 mmHg  Pulse 76  Resp 19  Ht 5' 4"  (1.626 m)  Wt 121 lb (54.885 kg)  BMI 20.76 kg/m2  SpO2  98% General:Thin, well-appearing HEENT:Unremarkable  Neck:no JVD, no bruits, no adenopathy  Chest:clear to auscultation, symmetrical breath sounds, no wheezes, no rhonchi  CV:RRR, grade IV/VI holodiastolic murmur with water hammer pulse Abdomen:soft, non-tender, no masses  Extremities:warm, well-perfused, pulses diminished but palpable, no LE edema Rectal/GUDeferred Neuro:Grossly non-focal and symmetrical throughout Skin:Clean and dry, no rashes, no breakdown   Diagnostic Tests:  Transthoracic Echocardiography  Patient: Alexis [name], Alexis [name] MR #: [id number] Study Date: 10/16/2015 Gender: M Age: 7 Height: 162.6 cm Weight: 53.1 kg BSA: 1.55 m^2 Pt. Status: Room: 3E21C  SONOGRAPHER Lavella Lemons [id number] ATTENDING Billy Fischer, Erin PERFORMING [facility], Inpatient  cc:  ------------------------------------------------------------------- LV EF: 40% - 45%  ------------------------------------------------------------------- Indications: CHF - 428.0.  ------------------------------------------------------------------- History: PMH: Hepatits C. Headache. Transient ischemic attack. Risk factors: Hypertension.  ------------------------------------------------------------------- Study Conclusions  - Left ventricle: The cavity size was normal. There was moderate  concentric hypertrophy. Systolic function was mildly to  moderately reduced. The estimated ejection fraction was in the  range of 40% to 45%. Diffuse hypokinesis. - Aortic valve:  Trileaflet; mildly thickened, mildly calcified  leaflets. There was severe regurgitation. - Mitral valve: There was moderate regurgitation directed  posteriorly. - Right ventricle: The cavity size was mildly dilated. Wall  thickness was normal. - Right atrium: The atrium was moderately dilated. - Tricuspid valve: There was moderate regurgitation. - Pulmonary arteries: Systolic pressure was severely increased. PA  peak pressure: 81 mm Hg (S).  Transthoracic echocardiography. M-mode, complete 2D, spectral Doppler, and color Doppler. Birthdate: Patient birthdate: 1951-02-12. Age: Patient is 65 yr old. Sex: Gender: male. BMI: 20.1 kg/m^2. Blood pressure: 148/56 Patient status: Inpatient. Study date: Study date: 10/16/2015. Study time: 02:39 PM. Location: Bedside.  -------------------------------------------------------------------  ------------------------------------------------------------------- Left ventricle: The cavity size was normal. There was moderate concentric hypertrophy. Systolic function was mildly to moderately reduced. The estimated ejection fraction was in the range of 40% to 45%. Diffuse hypokinesis.  ------------------------------------------------------------------- Aortic valve: Trileaflet; mildly thickened, mildly calcified leaflets. Mobility was not restricted. Doppler: Transvalvular velocity was within the normal range. There was no stenosis. There was severe regurgitation.  ------------------------------------------------------------------- Aorta: Aortic root: The aortic root was normal in size.  ------------------------------------------------------------------- Mitral valve: Mildly thickened leaflets . Mobility was not restricted. Doppler: Transvalvular velocity was within the normal range. There was no evidence for stenosis. There was moderate regurgitation directed  posteriorly.  ------------------------------------------------------------------- Left atrium: The atrium was normal in size.  ------------------------------------------------------------------- Right ventricle: The cavity size was mildly dilated. Wall thickness was normal. Systolic function was normal.  ------------------------------------------------------------------- Pulmonic valve: Poorly visualized. Structurally normal valve. Cusp separation was  normal. Doppler: Transvalvular velocity was within the normal range. There was no evidence for stenosis. There was no regurgitation.  ------------------------------------------------------------------- Tricuspid valve: Structurally normal valve. Doppler: Transvalvular velocity was within the normal range. There was moderate regurgitation.  ------------------------------------------------------------------- Pulmonary artery: The main pulmonary artery was normal-sized. Systolic pressure was severely increased.  ------------------------------------------------------------------- Right atrium: The atrium was moderately dilated.  ------------------------------------------------------------------- Pericardium: There was no pericardial effusion.  ------------------------------------------------------------------- Systemic veins: Inferior vena cava: The vessel was dilated. The respirophasic diameter changes were blunted (< 50%), consistent with elevated central venous pressure.  ------------------------------------------------------------------- Measurements  Left ventricle Value Reference LV ID, ED, PLAX chordal (H) 57.9 mm 43 - 52 LV ID, ES, PLAX chordal (H) 44.5 mm 23 - 38 LV fx shortening, PLAX chordal (L) 23 % >=29 LV PW thickness, ED 12.7 mm --------- IVS/LV PW ratio,  ED 1.18 <=1.3 LV e&', lateral 8.81 cm/s --------- LV e&', medial 9.79 cm/s --------- LV e&', average 9.3 cm/s ---------  Ventricular septum Value Reference IVS thickness, ED 15 mm ---------  LVOT Value Reference LVOT ID, S 25 mm --------- LVOT area 4.91 cm^2 ---------  Aortic valve Value Reference Aortic regurg peak velocity 173 cm/s --------- Aortic regurg pressure half-time 229 ms --------- Aortic regurg peak gradient 12 mm Hg ---------  Aorta Value Reference Aortic root ID, ED 34 mm ---------  Left atrium Value Reference LA ID, A-P, ES 36 mm --------- LA ID/bsa, A-P (H) 2.33 cm/m^2 <=2.2 LA volume, S 137 ml --------- LA volume/bsa, S 88.6 ml/m^2 --------- LA volume, ES, 1-p A4C 125 ml --------- LA volume/bsa, ES, 1-p A4C 80.9 ml/m^2 --------- LA volume, ES, 1-p A2C 140 ml --------- LA volume/bsa, ES, 1-p A2C 90.6 ml/m^2 ---------  Mitral valve Value Reference Mitral maximal regurg velocity, 528 cm/s --------- PISA Mitral regurg VTI, PISA 162 cm --------- Mitral ERO, PISA 0.11 cm^2 --------- Mitral  regurg volume, PISA 18 ml ---------  Pulmonary arteries Value Reference PA pressure, S, DP (H) 81 mm Hg <=30  Tricuspid valve Value Reference Tricuspid regurg peak velocity 406 cm/s --------- Tricuspid peak RV-RA gradient 66 mm Hg ---------  Systemic veins Value Reference Estimated CVP 15 mm Hg ---------  Right ventricle Value Reference RV pressure, S, DP (H) 81 mm Hg <=30 RV s&', lateral, S 17.1 cm/s ---------  Legend: (L) and (H) mark values outside specified reference range.  ------------------------------------------------------------------- Prepared and Electronically Authenticated by  Candee Furbish, M.D. 2017-02-09T15:34:42   Transthoracic Echocardiography  Patient: Alexis [name], Alexis [name] MR #: [id number] Study Date: 02/06/2016 Gender: M Age: 57 Height: 162.6 cm Weight: 48.4 kg BSA: 1.47 m^2 Pt. Status: Room: 2H27C  ADMITTING Reeves, Everlene Farrier D ATTENDING Ashkum, Anand D ORDERING [place], [place] D REFERRING [place], [place] D PERFORMING [facility], Inpatient SONOGRAPHER Madelin Rear, RDCS  cc:  ------------------------------------------------------------------- LV EF: 35% - 40%  ------------------------------------------------------------------- Indications: CHF - 428.0.  ------------------------------------------------------------------- History: PMH: Mitral valve disease. Aortic valve disease. Risk factors: Hypertension.  ------------------------------------------------------------------- Study Conclusions  - Left ventricle: The cavity size was normal. Wall  thickness was  normal. Systolic function was moderately reduced. The estimated  ejection fraction was in the range of 35% to 40%. Diffuse  hypokinesis. There was fusion of early and atrial contributions  to ventricular filling. The study is not technically sufficient  to allow evaluation of LV diastolic function. - Aortic valve: Moderately calcified annulus. Mild diffuse  calcification. There was moderate to severe regurgitation. - Mitral valve: There was mild regurgitation. - Left atrium: The atrium was severely dilated. - Atrial septum: No  defect or patent foramen ovale was identified. - Pulmonary arteries: PA peak pressure: 45 mm Hg (S).  Impressions:  - The right ventricular systolic pressure was increased consistent  with moderate pulmonary hypertension.  Transthoracic echocardiography. M-mode, complete 2D, spectral Doppler, and color Doppler. Birthdate: Patient birthdate: Aug 08, 1951. Age: Patient is 65 yr old. Sex: Gender: male. BMI: 18.3 kg/m^2. Blood pressure: 151/56 Patient status: Inpatient. Study date: Study date: 02/06/2016. Study time: 11:34 AM. Location: ICU/CCU  -------------------------------------------------------------------  ------------------------------------------------------------------- Left ventricle: The cavity size was normal. Wall thickness was normal. Systolic function was moderately reduced. The estimated ejection fraction was in the range of 35% to 40%. Diffuse hypokinesis. There was fusion of early and atrial contributions to ventricular filling. The study is not technically sufficient to allow evaluation of LV diastolic function.  ------------------------------------------------------------------- Aortic valve: Moderately calcified annulus. Mild diffuse calcification. Doppler: There was moderate to severe regurgitation.  ------------------------------------------------------------------- Mitral valve:  Structurally normal valve. Leaflet separation was normal. Doppler: Transvalvular velocity was within the normal range. There was no evidence for stenosis. There was mild regurgitation.  ------------------------------------------------------------------- Left atrium: The atrium was severely dilated.  ------------------------------------------------------------------- Atrial septum: No defect or patent foramen ovale was identified.  ------------------------------------------------------------------- Right ventricle: The cavity size was normal. Wall thickness was normal. Systolic function was normal.  ------------------------------------------------------------------- Pulmonic valve: Structurally normal valve. Cusp separation was normal. Doppler: Transvalvular velocity was within the normal range. There was no regurgitation.  ------------------------------------------------------------------- Tricuspid valve: Structurally normal valve. Leaflet separation was normal. Doppler: Transvalvular velocity was within the normal range. There was mild regurgitation.  ------------------------------------------------------------------- Right atrium: The atrium was normal in size.  ------------------------------------------------------------------- Measurements  Left ventricle Value Reference LV ID, ED, PLAX chordal (H) 55.7 mm 43 - 52 LV ID, ES, PLAX chordal (H) 44.9 mm 23 - 38 LV fx shortening, PLAX chordal (L) 19 % >=29 LV PW thickness, ED 8.13 mm --------- IVS/LV PW ratio, ED 1.08 <=1.3 LV ejection fraction, 1-p A4C 22 % --------- LV end-diastolic volume, 2-p 016 ml --------- LV end-systolic volume, 2-p 010 ml --------- LV ejection fraction, 2-p  23 % --------- Stroke volume, 2-p 58 ml --------- LV end-diastolic volume/bsa, 2-p 932 ml/m^2 --------- LV end-systolic volume/bsa, 2-p 355 ml/m^2 --------- Stroke volume/bsa, 2-p 39.4 ml/m^2 --------- LV e&', lateral 4.13 cm/s ---------  Ventricular septum Value Reference IVS thickness, ED 8.74 mm ---------  LVOT Value Reference LVOT ID, S 21 mm --------- LVOT area 3.46 cm^2 ---------  Aortic valve Value Reference Aortic regurg pressure half-time 298 ms ---------  Aorta Value Reference Aortic root ID, ED 34 mm ---------  Left atrium Value Reference LA ID, A-P, ES 31 mm --------- LA ID/bsa, A-P 2.11 cm/m^2 <=2.2 LA volume, S 102 ml --------- LA volume/bsa, S 69.3 ml/m^2 --------- LA volume, ES, 1-p A4C 97.6 ml --------- LA volume/bsa, ES, 1-p A4C 66.3 ml/m^2 --------- LA volume, ES, 1-p A2C 99.1 ml --------- LA volume/bsa, ES, 1-p A2C 67.3 ml/m^2 ---------  Mitral valve Value Reference Mitral regurg VTI, PISA 198 cm --------- Mitral ERO, PISA 0.16 cm^2 --------- Mitral regurg volume, PISA 32 ml ---------  Pulmonary arteries Value Reference PA pressure, S, DP (H) 45 mm  Hg <=30  Systemic veins Value Reference Estimated CVP 5 mm Hg ---------  Right ventricle Value Reference TAPSE 27 mm --------- RV s&', lateral, S 11.5 cm/s ---------  Legend: (L) and (H) mark values outside specified reference range.  ------------------------------------------------------------------- Prepared and Electronically Authenticated by  Fransico Him, MD 2017-06-02T15:27:21    CARDIAC CATHETERIZATION Procedures  Right/Left Heart Cath and Coronary Angiography    Conclusion     Essentially normal coronary arteries with only one notable lesion in a very small caliber third OM branch with 90% stenosis  There is moderate to severe left ventricular systolic dysfunction and moderate to severe aortic regurgitation by echocardiogram. Follow-up echocardiogram this coming weekend or later on this week.   Thankfully, would appear that the patient's symptoms dyspnea and chest discomfort are not likely the result of significant coronary disease.  Continued evaluation of aortic insufficiency. He appears to be euvolemic.  Plan: Return to nursing unit for post catheterization care and TR band removal.  He appears to be adequately diuresed.  Defer further evaluation to cardiology consult primary service.    Glenetta Hew, M.D., M.S. Interventional Cardiologist   Pager # [phone] Phone # [phone] [address] [place], [place] [address]      Indications    Congestive dilated cardiomyopathy (HCC) [I42.0 (ICD-10-CM)]   Aortic regurgitation [I35.1 (ICD-10-CM)]    Technique and Indications    PCP: [name], MD REFERRING MD: [place] CARDIOLOGIST: New - Dr. Stanford Breed.  65 year old male with past medical history of recent CVA, hypertension,  hepatitis C for evaluation of congestive heart failure. Patient was admitted in February 2017 with complaints of dyspnea. He was also noted to have weight loss. His BNP was elevated. Chest CT showed question filling defect felt likely artifact. There were small bilateral pleural effusions. He was also found to have a CVA by MRI. Echocardiogram revealed ejection fraction 40- 45%, moderate left ventricular hypertrophy, severe aortic insufficiency, biatrial enlargement, moderate tricuspid regurgitation and severely elevated pulmonary pressures. He was treated with antibiotics, bronchodilators, lisinopril and Lasix. Patient has chronic dyspnea on exertion. However over the last several months this has worsened. He also describes orthopnea and PND. He denies pedal edema. He has had occasional bouts of chest pain for years not exertional. He does not have exertional chest pain. No syncope. No fevers but he has had subjective chills. He was seenby primary care and cardiology was asked to evaluate.  As part of his evaluation for aortic insufficiency, he is referred for right left heart catheterization.   PROCEDURE Estimated blood loss <50 mL. There were no immediate complications during the procedure.  Time Out: Verified patient identification, verified procedure, site/side was marked, verified correct patient position, special equipment/implants available, medications/allergies/relevent history reviewed, required imaging and test results available. Performed.  During this procedure the patient is administered a total of Versed 3 mg and Fentanyl 75 mg to achieve and maintain moderate conscious sedation. The patient's heart rate, blood pressure, and oxygen saturation are monitored continuously during the procedure. The period of conscious sedation is 44 minutes, of which I was present face-to-face 100% of this time.  Access:  RIGHT Radial Artery: 6 Fr sheath -- Seldinger technique using Angiocath Micropuncture  Kit 10 mL radial cocktail IA; 2500 Units IV Heparin  Right Common Femoral Vein: 7 Fr Sheath - Seldinger Technique..   Right Heart Catheterization: 7 Fr Gordy Councilman catheter advanced under fluoroscopy with balloon inflated to the RA, RV, then PCWP-PA for hemodynamic measurement.  Simultaneous FA & PA blood gases checked for SaO2% to calculate FICK CO/CI  Thermodilution Injections performed to calculate CO/CI  Simultaneous PCWP/LV & RV/LV pressures monitored with Angled Pigtail in LV.  Catheter removed completely out of the body with balloon deflated.  Left Heart Catheterization: 5Fr Catheters advanced or exchanged over a J-wire under direct fluoroscopic guidance into the  ascending aorta; TIG 4.0 catheter advanced firsvv.0 Left & RIGHT Coronary Artery Cineangiography: TIG 4.0 Catheter  Right Coronary Artery, SVG-RCA & SVG-OM Cineangiography: n/a  Femoral Venous Sheath(s) removed in the PACU Holding Area with manual pressure for hemostasis.   Radial sheath removed in the Cardiac Catheterization lab with TR Band placed for hemostasis.  TR Band: 1350 Hours; 17 mL air  MEDICATIONS * 75 g IV fentanyl, 3 mgIV Versed * SQ Lidocaine 66m for radial, 18 mL for femoral vein * Radial Cocktail: 3 mg in verapamil 10 mL NS * Isovue Contrast: 70 * Heparin: 2500 Units    Coronary Findings    Dominance: Right   Left Main  . Vessel is large.     Left Anterior Descending   . First Diagonal Branch   The vessel is small in size.   . First Septal Branch   The vessel is small in size.   .Marland KitchenSecond Diagonal Branch   The vessel is moderate in size.   .Marland KitchenSecond Septal Branch   The vessel is small in size.   . Third Diagonal Branch   The vessel is small in size.   . Third Septal Branch   The vessel is small in size.     Left Circumflex   . Lateral First Obtuse Marginal Branch   The vessel is small in size.   . Third Obtuse Marginal Branch    The vessel is small in size.   . 3rd Mrg lesion, 99% stenosed. Diffuse. Feels very faintly to distal.     Right Coronary Artery  . Vessel is large. Vessel is angiographically normal. The vessel is tortuous.   . Acute Marginal Branch   The vessel is small in size.   . Right Posterior Atrioventricular Branch   The vessel is moderate in size.   . First Right Posterolateral   The vessel is small in size.   .Marland KitchenSecond Right Posterolateral   The vessel is moderate in size.   . Third Right Posterolateral   The vessel is large in size.       Right Heart Pressures Normal right heart cath pressures LV EDP is normal.    Left Heart    Left Ventricle There is moderate to severe left ventricular systolic dysfunction. EF roughly 35% by echo The ejection fraction could not be assess due to No LV gram.   Aortic Valve There is no aortic valve stenosis. The aortic valve is calcified. Moderate-severe by echo    Coronary Diagrams    Diagnostic Diagram            Implants    No implant documentation for this case.    PACS Images    Show images for Cardiac catheterization     Link to Procedure Log    Procedure Log      Hemo Data       Most Recent Value   Fick Cardiac Output  3.69 L/min   Fick Cardiac Output Index  2.43 (L/min)/BSA   Thermal Cardiac Output  4.06 L/min   Thermal Cardiac Output Index  2.67 (L/min)/BSA   Mitral Mean Gradient  4.1 mmHg   Mitral Peak Gradient  0 mmHg   Mitral Valve Area Index  1.72 cm2/BSA   RA A Wave  6 mmHg   RA V Wave  5 mmHg   RA Mean  4 mmHg   RV Systolic Pressure  28 mmHg   RV Diastolic Pressure  3 mmHg  RV EDP  6 mmHg   PA Systolic Pressure  31 mmHg   PA Diastolic Pressure  13 mmHg   PA Mean  20 mmHg   PW A Wave  13 mmHg   PW V Wave  10 mmHg   PW Mean  10 mmHg    LV Systolic Pressure  973 mmHg   LV Diastolic Pressure  3 mmHg   LV EDP  8 mmHg   Arterial Occlusion Pressure Extended Systolic Pressure  532 mmHg   Arterial Occlusion Pressure Extended Diastolic Pressure  47 mmHg   Arterial Occlusion Pressure Extended Mean Pressure  76 mmHg   Left Ventricular Apex Extended Systolic Pressure  992 mmHg   Left Ventricular Apex Extended Diastolic Pressure  4 mmHg   Left Ventricular Apex Extended EDP Pressure  8 mmHg   TPVR Index  7.49 HRUI     Procedure: TEE  Sedation: Versed 5 mg IV, Fentanyl 50 mcg   Indication: Aortic insufficiency  Findings: Please see echo section for full report. Mildly dilated LV with mild LV hypertrophy. EF 35%, diffuse hypokinesis. Normal RV size and systolic function. Normal LA size with no LAA thrombus. Normal RA size. Trivial TR. Trivial MR. The aortic valve was trileaflet but leaflets appeared redundant. There was severe, eccentric aortic insufficiency. No aortic stenosis. Unfortunately, the TEE machine used does not have 3D. The aortic root measured 3.6 cm and the ascending aorta was not dilated. No PFO/ASD (negative bubble study). There was holodiastolic flow reversal in the ascending aorta and partial diastolic flow reversal in the descending thoracic aorta.   Impression: Severe aortic insufficiency.   Loralie Champagne 02/11/2016    Impression:  Patient has stage D severe symptomatic aortic insufficiency. I have personally reviewed the patient's chart, transthoracic and transesophageal echocardiograms, CT angiogram of the chest, MRI of the brain, and diagnostic cardiac catheterization. At the time the patient's most recent hospitalization he presented with acute combined systolic and diastolic congestive heart failure, New York Heart Association functional class IV. Symptoms improved quickly with diuretic therapy. His previous clinical presentation is  somewhat suspicious for the possibility of subacute bacterial endocarditis, although blood cultures were not obtained when he was hospitalized in February, blood cultures obtained during his recent hospitalization were negative, and transesophageal echocardiogram did not reveal the presence of obvious vegetations. Transesophageal echocardiogram confirmed the presence of essentially wide open aortic insufficiency with possible perforation of at least one of the leaflets of the aortic valve. There is moderate left ventricular systolic dysfunction. Under the circumstances I favor proceeding with aortic valve replacement as soon as practical. The patient has not seen a dentist in many years and should be evaluated prior to surgery.   Plan:  The patient was counseled at length regarding treatment alternatives for management of severe aortic insufficiency including continued medical therapy versus proceeding with aortic valve replacement in the near future. The natural history of aortic insufficiency was reviewed, as was long term prognosis with medical therapy alone. Surgical options were discussed at length including conventional surgical aortic valve replacement through either a full median sternotomy or using minimally invasive techniques. Discussion was held comparing the relative risks of mechanical valve replacement with need for lifelong anticoagulation versus use of a bioprosthetic tissue valve and the associated potential for late structural valve deterioration and failure. This discussion was placed in the context of the patient's particular circumstances, and as a result the patient specifically requests that their valve be replaced using a bioprosthetic tissue valve.  I have offered to schedule the patient for surgery as early as next week. For personal reasons the patient desires to wait until sometime in July so that he can get his personal affairs in order. We have discussed expectations  for his postoperative convalescence, particularly given the fact that he lives alone. All of his questions have been addressed. We tentatively plan to proceed with surgery on Thursday, 03/18/2016. The patient has been advised to monitor his weight closely and look for signs of fluid retention such as increased shortness of breath, weight gain, and lower extremity edema. He will be referred to the dental clinic for evaluation as soon as practical. The patient will return for follow-up on 03/16/2016 prior to surgery.   I spent in excess of 90 minutes during the conduct of this office consultation and >50% of this time involved direct face-to-face encounter with the patient for counseling and/or coordination of their care.   Valentina Gu. [name], MD 02/17/2016 3:50 PM

## 2016-03-01 NOTE — Anesthesia Postprocedure Evaluation (Signed)
Anesthesia Post Note  Patient: [name]  Procedure(s) Performed: Procedure(s) (LRB): Extraction of tooth #'s [id number] with alveoloplasty and gross debridement of remaining teeth. (N/A)  Patient location during evaluation: PACU Anesthesia Type: General Level of consciousness: awake and alert Pain management: pain level controlled Vital Signs Assessment: post-procedure vital signs reviewed and stable Respiratory status: spontaneous breathing, nonlabored ventilation, respiratory function stable and patient connected to nasal cannula oxygen Cardiovascular status: blood pressure returned to baseline and stable Postop Assessment: no signs of nausea or vomiting Anesthetic complications: no Comments: Cardiology help appreciated. Cleared to head home.    Last Vitals:  Filed Vitals:   03/01/16 1130 03/01/16 1145  BP:  150/55  Pulse:  61  Temp: 36.4 C   Resp:  12    Last Pain:  Filed Vitals:   03/01/16 1148  PainSc: 7                  Margaret Cockerill DAVID

## 2016-03-01 NOTE — Progress Notes (Signed)
Pt using 10cc syringe to take po's, encouraged liquids at home.  Niece  Coming to pick up patient.

## 2016-03-01 NOTE — Progress Notes (Signed)
PRE-OPERATIVE NOTE:  03/01/2016 Alexis [name] [id number]  VITALS: BP 186/56 mmHg  Pulse 69  Temp(Src) 98.3 F (36.8 C) (Oral)  Resp 18  Ht 5\' 4"  (1.626 m)  Wt 116 lb 14.2 oz (53.02 kg)  BMI 20.05 kg/m2  SpO2 100%  Lab Results  Component Value Date   WBC 4.6 02/27/2016   HGB 11.7* 02/27/2016   HCT 36.0* 02/27/2016   MCV 90.9 02/27/2016   PLT 269 02/27/2016   BMET    Component Value Date/Time   NA 140 02/27/2016 1416   K 3.7 02/27/2016 1416   CL 111 02/27/2016 1416   CO2 21* 02/27/2016 1416   GLUCOSE 80 02/27/2016 1416   BUN 9 02/27/2016 1416   CREATININE 0.90 02/27/2016 1416   CREATININE 0.70 02/20/2016 1027   CALCIUM 9.2 02/27/2016 1416   GFRNONAA >60 02/27/2016 1416   GFRAA >60 02/27/2016 1416    Lab Results  Component Value Date   INR 1.05 02/27/2016   INR 1.03 02/09/2016   INR 1.13 10/15/2015   No results found for: PTT   Alexis [name] presents for multiple extractions with alveoloplasty and gross debridement of remaining dentition the operative room and general anesthesia.   SUBJECTIVE: The patient denies any acute medical or dental changes and agrees to proceed with treatment as planned.  EXAM: No sign of acute dental changes.  ASSESSMENT: Patient is affected by chronic apical periodontitis, retained root segments, loose teeth, dental caries, chronic periodontitis, and accretions.  PLAN: Patient agrees to proceed with treatment as planned in the operating room as previously discussed and accepts the risks, benefits, and complications of the proposed treatment. Patient is aware of the risk for bleeding, bruising, swelling, infection, pain, nerve damage, soft tissue damage, damage to adjacent teeth, sinus involvement, root tip fracture, mandible fracture, and the risks of complications associated with the anesthesia. Patient also is aware of the potential for other complications up to and including death due to his overall cardiovascular  compromise.   [name], DDS

## 2016-03-01 NOTE — Anesthesia Procedure Notes (Signed)
Procedure Name: Intubation Date/Time: 03/01/2016 7:38 AM Performed by: Clearnce Sorrel Pre-anesthesia Checklist: Patient identified, Emergency Drugs available, Suction available, Patient being monitored and Timeout performed Patient Re-evaluated:Patient Re-evaluated prior to inductionOxygen Delivery Method: Circle system utilized Preoxygenation: Pre-oxygenation with 100% oxygen Intubation Type: IV induction Ventilation: Mask ventilation without difficulty Laryngoscope Size: Mac and 3 Grade View: Grade I Tube type: Oral Tube size: 7.0 mm Number of attempts: 1 Airway Equipment and Method: Stylet Placement Confirmation: ETT inserted through vocal cords under direct vision,  positive ETCO2 and breath sounds checked- equal and bilateral Secured at: 23 cm Tube secured with: Tape Dental Injury: Teeth and Oropharynx as per pre-operative assessment

## 2016-03-01 NOTE — Progress Notes (Signed)
Dr. Conrad [place] called with EKG results and patient status.  Dr. Conrad Braxton requesting cardiology consult to clear prior to discharge today.  Dr. Enrique Sack made aware and he states he will call consult.

## 2016-03-01 NOTE — Op Note (Signed)
OPERATIVE REPORT  Patient:            Alexis [name] Date of Birth:  27-Oct-1950 MRN:                [id number]   DATE OF PROCEDURE:  03/01/2016  PREOPERATIVE DIAGNOSES: 1. Severe aortic regurgitation 2. Pre-heart valve surgery dental protocol 3. Chronic apical periodontitis 4. Dental caries 5. Retained root segments 6. Chronic periodontitis 7. Accretions  POSTOPERATIVE DIAGNOSES: 1. Severe aortic regurgitation 2. Pre-heart valve surgery dental protocol 3. Chronic apical periodontitis 4. Dental caries 5. Retained root segments 6. Chronic periodontitis 7. Accretions  OPERATIONS: 1. Multiple extraction of tooth numbers 2, 3, 12, 21, 28, 29, and 30 2. 4 Quadrants of alveoloplasty 3. Gross debridement of remaining dentition   SURGEON: [name], DDS  ASSISTANT: Camie Patience, (dental assistant)  ANESTHESIA: General anesthesia via oral endotracheal tube.  MEDICATIONS: 1. Ancef 2 g IV prior to invasive dental procedures. 2. Local anesthesia with a total utilization of 4 carpules each containing 34 mg of lidocaine with 0.017 mg of epinephrine as well as 2 carpules each containing 9 mg of bupivacaine with 0.009 mg of epinephrine.  SPECIMENS: There are 7 teeth that were discarded.  DRAINS: None  CULTURES: None  COMPLICATIONS: None   ESTIMATED BLOOD LOSS: 50 mLs.  INTRAVENOUS FLUIDS: 500 mLs of Lactated ringers solution.  INDICATIONS: The patient was recently diagnosed with severe aortic regurgitation.  A medically necessary dental consultation was then requested prior to anticipated heart valve surgery.  The patient was examined and treatment planned for multiple extractions with alveoloplasty and gross debridement of remaining dentition in the operating room with general anesthesia.  This treatment plan was formulated to decrease the risks and complications associated with dental infection from affecting the patient's systemic health and the anticipated heart  valve surgery.  OPERATIVE FINDINGS: Patient was examined operating room number 10.  The teeth were identified for extraction. The patient was noted be affected by chronic periodontitis, accretions, chronic apical periodontitis, multiple retained root segments, and dental caries. The patient did have some previous buccal swelling area #30 but none was noted today.   DESCRIPTION OF PROCEDURE: Patient was brought to the main operating room number 10. Patient was then placed in the supine position on the operating table. General anesthesia was then induced per the anesthesia team. The patient was then prepped and draped in the usual manner for a dental medicine procedure. A timeout was performed. The patient was identified and procedures were verified. A throat pack was placed at this time. The oral cavity was then thoroughly examined with the findings noted above. The patient was then ready for dental medicine procedure as follows:  Local anesthesia was then administered sequentially with a total utilization of 4 carpules each containing 34 mg of lidocaine with 0.017 mg of epinephrine as well as 2 carpules  each containing 9 mg bupivacaine with 0.009 mg of epinephrine.  The Maxillary left and right quadrants first approached. Anesthesia was then delivered utilizing infiltration with lidocaine with epinephrine. A #15 blade incision was then made from the maxillary right tuberosity and extended to the mesial of #4.  A  surgical flap was then carefully reflected. Tooth numbers 2 and 3 were then subluxated with a series of straight elevators. Tooth number 2 was then removed with a 53R forceps without complications. Retained roots in the area #3 were then removed with rongeurs without complications. Alveoloplasty was then performed utilizing a ronguers and bone file.  The surgical site was then irrigated with copious amounts of sterile saline. A piece of Surgifoam was placed in the extraction sockets  appropriately. The tissues were approximated and trimmed appropriately. The surgical site was then closed from the maxillary right tuberosity and extended to the distal of #4 utilizing 3-0 chromic gut suture in a continuous interrupted suture technique 1.  At this point time the maxillary left surgical site was approached. A 15 blade incision was made on the buccal and palatal aspects of tooth #12.  An envelope flap was then reflected appropriately. The retained root in the area #12 was then removed with a 150 forceps without complications. Alveoloplasty was then performed on the buccal and palatal aspects to assist in obtaining primary closure. The surgical site was then irrigated with copious amounts sterile saline. A piece of Surgifoam was placed in the extraction socket. The surgical site was then closed from the mesial of #13 and extended to the distal of #11 utilizing 3-0 chromic gut suture material in a continuous interrupted suture technique 1.  At this point time, the mandibular quadrants were approached. The patient was given a mandibular right inferior alveolar nerve block and mandibular left mental nerve block along with bilateral long buccal nerve blocks utilizing the bupivacaine with epinephrine. Further infiltration was then achieved utilizing the lidocaine with epinephrine. A 15 blade incision was then made from the distal of number 19 and extended to the mesial of #21.  A surgical flap was then carefully reflected. Tooth #21 was then subluxated with a series of straight elevators and removed without complication with a [id number] forceps.  Alveoloplasty was then performed on the buccal and lingual aspects to assist in obtaining primary closure. The surgical site was then irrigated with copious amounts sterile saline. A piece of Surgifoam was placed in the extraction socket. Surgical site was then closed from the distal of #19 and extended to the distal of #22 utilizing 3-0 chromic gut suture in a  continuous interrupted suture technique 1.  The mandibular right surgical site was then approached. A 15 blade incision was made from the distal of #32 and extended to the mesial of #28.  A surgical flap was then carefully reflected. The tooth numbers 28-30 were then subluxated with a series of straight elevators.  Tooth numbers 28, 29, and 30 were then removed utilizing a 151 forceps. Alveoloplasty was then performed utilizing a rongeurs and bone file to assist in obtaining primary closure. The tissues were approximated and trimmed appropriately. The surgical sites were then irrigated with copious amounts of sterile saline. A piece of Surgifoam was placed in the extraction sockets appropriately. The mandibular right surgical site was then closed from the distal of #32 and extended to the distal of #27 utilizing 3-0 chromic gut suture in a continuous interrupted suture technique 1. One individual interrupted sutures then placed to further closed surgical site utilizing 3-0 chromic gut material.    At this point time the remaining teeth were approached. A gross debridement procedure was performed utilizing a sonic scaler. Series of hand curettes were then utilized to further remove accretions. A sonic scaler was then again used to further refine removal of accretions. This completed the gross debridement procedure.  At this point time, the entire mouth was irrigated with copious amounts of sterile saline. The patient was examined for complications, seeing none, the dental medicine procedure was deemed to be complete. The throat pack was removed at this time. An oral airway was then placed at  the request of the anesthesia team. A series of 4 x 4 gauze were placed in the mouth to aid hemostasis. The patient was then handed over to the anesthesia team for final disposition. After an appropriate amount of time, the patient was extubated and taken to the postanesthsia care unit in good condition. All counts were  correct for the dental medicine procedure.   [name], DDS.

## 2016-03-01 NOTE — Progress Notes (Signed)
Pt resting quielty, respiratory rate up to 47 and pt diaphoretic, otherwise VS stable, when questioned pt denies chest pain or difficulty breathing, states pain in mouth is a 7 and he has pain in his left eye, history of migraines and states he came in with a headache this morning. Will administer pain meds and continue monitoring .

## 2016-03-02 ENCOUNTER — Encounter (HOSPITAL_COMMUNITY): Payer: Self-pay | Admitting: Dentistry

## 2016-03-04 ENCOUNTER — Encounter: Payer: Self-pay | Admitting: *Deleted

## 2016-03-11 ENCOUNTER — Telehealth: Payer: Self-pay | Admitting: Cardiology

## 2016-03-11 NOTE — Telephone Encounter (Signed)
Returning call about his lab work .Marland Kitchen Thanks

## 2016-03-11 NOTE — Telephone Encounter (Signed)
Spoke with pt, aware of lab results. 

## 2016-03-15 ENCOUNTER — Encounter (HOSPITAL_COMMUNITY): Payer: Self-pay | Admitting: Dentistry

## 2016-03-15 ENCOUNTER — Ambulatory Visit (HOSPITAL_COMMUNITY): Payer: Medicaid - Dental | Admitting: Dentistry

## 2016-03-15 VITALS — BP 150/45 | HR 72 | Temp 98.2°F

## 2016-03-15 DIAGNOSIS — I351 Nonrheumatic aortic (valve) insufficiency: Secondary | ICD-10-CM

## 2016-03-15 DIAGNOSIS — K08409 Partial loss of teeth, unspecified cause, unspecified class: Secondary | ICD-10-CM

## 2016-03-15 DIAGNOSIS — Z01818 Encounter for other preprocedural examination: Secondary | ICD-10-CM

## 2016-03-15 MED ORDER — CHLORHEXIDINE GLUCONATE 0.12 % MT SOLN: OROMUCOSAL | Status: DC

## 2016-03-15 NOTE — Patient Instructions (Signed)
PLAN: 1. Use chlorhexidine rinses twice daily after breakfast and at bedtime. Prescription was sent to [facility] with refills for 6 months. 2. Continue salt water rinses as needed to aid healing. 3. Brush after meals and at bedtime. 4. Patient is cleared for aortic valve replacement at this time. 5. Follow-up with new primary dentist in 3 months for exam, radiographs, and discussion of other dental treatment needs. Patient to be medically cleared by Dr. [name] for this treatment.  Patient will require antibiotic premedication prior to invasive dental procedures as per American Heart Association guidelines.   [name], DDS

## 2016-03-15 NOTE — Progress Notes (Signed)
POST OPERATIVE NOTE:  03/15/2016 Danton Sewer [id number]  VITALS: BP 150/45 mmHg  Pulse 72  Temp(Src) 98.2 F (36.8 C) (Oral)  LABS:  Lab Results  Component Value Date   WBC 4.6 02/27/2016   HGB 11.7* 02/27/2016   HCT 36.0* 02/27/2016   MCV 90.9 02/27/2016   PLT 269 02/27/2016   BMET    Component Value Date/Time   NA 140 02/27/2016 1416   K 3.7 02/27/2016 1416   CL 111 02/27/2016 1416   CO2 21* 02/27/2016 1416   GLUCOSE 80 02/27/2016 1416   BUN 9 02/27/2016 1416   CREATININE 0.90 02/27/2016 1416   CREATININE 0.70 02/20/2016 1027   CALCIUM 9.2 02/27/2016 1416   GFRNONAA >60 02/27/2016 1416   GFRAA >60 02/27/2016 1416    Lab Results  Component Value Date   INR 1.05 02/27/2016   INR 1.03 02/09/2016   INR 1.13 10/15/2015   No results found for: PTT   Nayef Berrones is status post Multiple extractions with alveoloplasty and gross debridement of remaining dentition in the operating room on 03/01/2016. Patient now presents for evaluation of healing and suture removal as needed.  SUBJECTIVE: The patient has minimal complaints. Patient is eating well by report.  EXAM: There is no sign of infection, heme, or ooze. Sutures are all gone. Patient is healing in by generalized primary closure.  PROCEDURE: The patient was given a chlorhexidine gluconate rinse for 30 seconds. No sutures were noted. Patient tolerated the procedure well.  ASSESSMENT: Post operative course is consistent with dental procedures performed in the operating room with general anesthesia. Loss of teeth due to extractions  PLAN: 1. Use chlorhexidine rinses twice daily after breakfast and at bedtime. Prescription was sent to [facility] with refills for 6 months. 2. Continue salt water rinses as needed to aid healing. 3. Brush after meals and at bedtime. 4. Patient is cleared for aortic valve replacement at this time. 5. Follow-up with new primary dentist in 3 months for exam, radiographs, and  discussion of other dental treatment needs. Patient to be medically cleared by Dr. [name] for this treatment.  Patient will require antibiotic premedication prior to invasive dental procedures as per American Heart Association guidelines.   [name], DDS

## 2016-03-15 NOTE — Pre-Procedure Instructions (Signed)
    Rucker Toma  03/15/2016      [address] [place], [place] [address] Phone: [phone] Fax: [phone]    Your procedure is scheduled on Thursday July 13th at 7:30 am  Report to [facility] Admitting at 5:30 am  Call this number if you have problems the morning of surgery:  [phone]   Remember:  Do not eat food or drink liquids after midnight.   Take these medicines the morning of surgery with A SIP OF WATER: carvedilol (coreg), pain pill if needed.  Stop aspirin or aspirin containing products, Nsaids (i.e. Naproxen, aleve, motrin, ibuprofen, advil), vitamins and herbal supplements.   Do not wear jewelry  Do not wear lotions, powders, or cologne.  You may not wear deoderant.  Men may shave face and neck.  Do not bring valuables to the hospital.  [facility] is not responsible for any belongings or valuables.  Contacts, dentures or bridgework may not be worn into surgery.  Leave your suitcase in the car.  After surgery it may be brought to your room.  Special instructions:  [facility]- Preparing For Surgery  Before surgery, you can play an important role. Because skin is not sterile, your skin needs to be as free of germs as possible. You can reduce the number of germs on your skin by washing with CHG (chlorahexidine gluconate) Soap before surgery.  CHG is an antiseptic cleaner which kills germs and bonds with the skin to continue killing germs even after washing.  Please do not use if you have an allergy to CHG or antibacterial soaps. If your skin becomes reddened/irritated stop using the CHG.  Do not shave (including legs and underarms) for at least 48 hours prior to first CHG shower. It is OK to shave your face.  Please follow these instructions carefully.   1. Shower the NIGHT BEFORE SURGERY and the MORNING OF SURGERY with CHG.   2. If you chose to wash your hair, wash your  hair first as usual with your normal shampoo.  3. After you shampoo, rinse your hair and body thoroughly to remove the shampoo.  4. Use CHG as you would any other liquid soap. You can apply CHG directly to the skin and wash gently with a scrungie or a clean washcloth.   5. Apply the CHG Soap to your body ONLY FROM THE NECK DOWN.  Do not use on open wounds or open sores. Avoid contact with your eyes, ears, mouth and genitals (private parts). Wash genitals (private parts) with your normal soap.  6. Wash thoroughly, paying special attention to the area where your surgery will be performed.  7. Thoroughly rinse your body with warm water from the neck down.  8. DO NOT shower/wash with your normal soap after using and rinsing off the CHG Soap.  9. Pat yourself dry with a CLEAN TOWEL.   10. Wear CLEAN PAJAMAS   11. Place CLEAN SHEETS on your bed the night of your first shower and DO NOT SLEEP WITH PETS.    Day of Surgery: Do not apply any deodorants/lotions. Please wear clean clothes to the hospital/surgery center.      Please read over the following fact sheets that you were given. Coughing and Deep Breathing, Blood Transfusion Information, MRSA Information and Surgical Site Infection Prevention, preparing for surgery (shower instructions), incentive spirometry.

## 2016-03-16 ENCOUNTER — Encounter (HOSPITAL_COMMUNITY)
Admission: RE | Admit: 2016-03-16 | Discharge: 2016-03-16 | Disposition: A | Discharge status: Home / Self Care | Payer: Medicare HMO | Source: Ambulatory Visit | Attending: Thoracic Surgery (Cardiothoracic Vascular Surgery) | Admitting: Thoracic Surgery (Cardiothoracic Vascular Surgery)

## 2016-03-16 ENCOUNTER — Ambulatory Visit (HOSPITAL_COMMUNITY)
Admission: RE | Admit: 2016-03-16 | Discharge: 2016-03-16 | Disposition: A | Discharge status: Home / Self Care | Payer: Medicare HMO | Source: Ambulatory Visit | Attending: Thoracic Surgery (Cardiothoracic Vascular Surgery) | Admitting: Thoracic Surgery (Cardiothoracic Vascular Surgery)

## 2016-03-16 ENCOUNTER — Ambulatory Visit (INDEPENDENT_AMBULATORY_CARE_PROVIDER_SITE_OTHER): Payer: Medicare HMO | Admitting: Thoracic Surgery (Cardiothoracic Vascular Surgery)

## 2016-03-16 ENCOUNTER — Encounter (HOSPITAL_COMMUNITY): Payer: Self-pay

## 2016-03-16 ENCOUNTER — Encounter: Payer: Self-pay | Admitting: Thoracic Surgery (Cardiothoracic Vascular Surgery)

## 2016-03-16 ENCOUNTER — Ambulatory Visit (HOSPITAL_BASED_OUTPATIENT_CLINIC_OR_DEPARTMENT_OTHER)
Admission: RE | Admit: 2016-03-16 | Discharge: 2016-03-16 | Disposition: A | Discharge status: Home / Self Care | Payer: Medicare HMO | Source: Ambulatory Visit | Attending: Thoracic Surgery (Cardiothoracic Vascular Surgery) | Admitting: Thoracic Surgery (Cardiothoracic Vascular Surgery)

## 2016-03-16 VITALS — BP 170/69 | HR 76 | Resp 20 | Ht 64.0 in | Wt 119.0 lb

## 2016-03-16 VITALS — BP 179/53 | HR 72 | Temp 97.0°F | Resp 20 | Ht 64.0 in | Wt 119.4 lb

## 2016-03-16 DIAGNOSIS — I6523 Occlusion and stenosis of bilateral carotid arteries: Secondary | ICD-10-CM | POA: Insufficient documentation

## 2016-03-16 DIAGNOSIS — I5023 Acute on chronic systolic (congestive) heart failure: Secondary | ICD-10-CM | POA: Diagnosis not present

## 2016-03-16 DIAGNOSIS — I071 Rheumatic tricuspid insufficiency: Secondary | ICD-10-CM | POA: Diagnosis not present

## 2016-03-16 DIAGNOSIS — F329 Major depressive disorder, single episode, unspecified: Secondary | ICD-10-CM | POA: Diagnosis present

## 2016-03-16 DIAGNOSIS — Z01818 Encounter for other preprocedural examination: Secondary | ICD-10-CM

## 2016-03-16 DIAGNOSIS — I351 Nonrheumatic aortic (valve) insufficiency: Secondary | ICD-10-CM

## 2016-03-16 DIAGNOSIS — Z7982 Long term (current) use of aspirin: Secondary | ICD-10-CM

## 2016-03-16 DIAGNOSIS — Z0183 Encounter for blood typing: Secondary | ICD-10-CM

## 2016-03-16 DIAGNOSIS — I2510 Atherosclerotic heart disease of native coronary artery without angina pectoris: Secondary | ICD-10-CM | POA: Diagnosis not present

## 2016-03-16 DIAGNOSIS — D62 Acute posthemorrhagic anemia: Secondary | ICD-10-CM | POA: Diagnosis not present

## 2016-03-16 DIAGNOSIS — K5900 Constipation, unspecified: Secondary | ICD-10-CM | POA: Diagnosis present

## 2016-03-16 DIAGNOSIS — N200 Calculus of kidney: Secondary | ICD-10-CM | POA: Diagnosis present

## 2016-03-16 DIAGNOSIS — D696 Thrombocytopenia, unspecified: Secondary | ICD-10-CM | POA: Diagnosis present

## 2016-03-16 DIAGNOSIS — K219 Gastro-esophageal reflux disease without esophagitis: Secondary | ICD-10-CM | POA: Diagnosis present

## 2016-03-16 DIAGNOSIS — I361 Nonrheumatic tricuspid (valve) insufficiency: Secondary | ICD-10-CM

## 2016-03-16 DIAGNOSIS — Z01812 Encounter for preprocedural laboratory examination: Secondary | ICD-10-CM

## 2016-03-16 DIAGNOSIS — I083 Combined rheumatic disorders of mitral, aortic and tricuspid valves: Principal | ICD-10-CM | POA: Diagnosis present

## 2016-03-16 DIAGNOSIS — I110 Hypertensive heart disease with heart failure: Secondary | ICD-10-CM | POA: Diagnosis present

## 2016-03-16 DIAGNOSIS — I082 Rheumatic disorders of both aortic and tricuspid valves: Secondary | ICD-10-CM | POA: Insufficient documentation

## 2016-03-16 DIAGNOSIS — E43 Unspecified severe protein-calorie malnutrition: Secondary | ICD-10-CM | POA: Diagnosis present

## 2016-03-16 DIAGNOSIS — Z8249 Family history of ischemic heart disease and other diseases of the circulatory system: Secondary | ICD-10-CM

## 2016-03-16 DIAGNOSIS — Z8673 Personal history of transient ischemic attack (TIA), and cerebral infarction without residual deficits: Secondary | ICD-10-CM

## 2016-03-16 DIAGNOSIS — G40909 Epilepsy, unspecified, not intractable, without status epilepticus: Secondary | ICD-10-CM | POA: Diagnosis present

## 2016-03-16 DIAGNOSIS — Z87891 Personal history of nicotine dependence: Secondary | ICD-10-CM

## 2016-03-16 DIAGNOSIS — I5042 Chronic combined systolic (congestive) and diastolic (congestive) heart failure: Secondary | ICD-10-CM | POA: Diagnosis present

## 2016-03-16 DIAGNOSIS — Z79899 Other long term (current) drug therapy: Secondary | ICD-10-CM

## 2016-03-16 DIAGNOSIS — B1920 Unspecified viral hepatitis C without hepatic coma: Secondary | ICD-10-CM | POA: Diagnosis present

## 2016-03-16 DIAGNOSIS — I272 Other secondary pulmonary hypertension: Secondary | ICD-10-CM | POA: Diagnosis present

## 2016-03-16 DIAGNOSIS — J9811 Atelectasis: Secondary | ICD-10-CM | POA: Diagnosis not present

## 2016-03-16 DIAGNOSIS — Z681 Body mass index (BMI) 19 or less, adult: Secondary | ICD-10-CM

## 2016-03-16 LAB — URINALYSIS, ROUTINE W REFLEX MICROSCOPIC
Bilirubin Urine: NEGATIVE
GLUCOSE, UA: NEGATIVE mg/dL
Hgb urine dipstick: NEGATIVE
Ketones, ur: NEGATIVE mg/dL
Leukocytes, UA: NEGATIVE
NITRITE: NEGATIVE
PROTEIN: NEGATIVE mg/dL
SPECIFIC GRAVITY, URINE: 1.01 (ref 1.005–1.030)
pH: 6 (ref 5.0–8.0)

## 2016-03-16 LAB — PULMONARY FUNCTION TEST
DL/VA % pred: 93 %
DL/VA: 3.92 ml/min/mmHg/L
DLCO COR % PRED: 88 %
DLCO UNC: 19.47 ml/min/mmHg
DLCO cor: 21.46 ml/min/mmHg
DLCO unc % pred: 80 %
FEF 25-75 POST: 2.11 L/s
FEF 25-75 Pre: 1.92 L/sec
FEF2575-%Change-Post: 9 %
FEF2575-%PRED-POST: 98 %
FEF2575-%Pred-Pre: 89 %
FEV1-%CHANGE-POST: 3 %
FEV1-%PRED-POST: 116 %
FEV1-%PRED-PRE: 112 %
FEV1-POST: 2.71 L
FEV1-PRE: 2.61 L
FEV1FVC-%CHANGE-POST: 5 %
FEV1FVC-%PRED-PRE: 93 %
FEV6-%Change-Post: -1 %
FEV6-%Pred-Post: 122 %
FEV6-%Pred-Pre: 123 %
FEV6-Post: 3.55 L
FEV6-Pre: 3.6 L
FEV6FVC-%CHANGE-POST: 0 %
FEV6FVC-%Pred-Post: 104 %
FEV6FVC-%Pred-Pre: 104 %
FVC-%CHANGE-POST: -1 %
FVC-%PRED-PRE: 118 %
FVC-%Pred-Post: 117 %
FVC-PRE: 3.62 L
FVC-Post: 3.58 L
POST FEV1/FVC RATIO: 76 %
PRE FEV6/FVC RATIO: 99 %
Post FEV6/FVC ratio: 99 %
Pre FEV1/FVC ratio: 72 %
RV % pred: 95 %
RV: 1.94 L
TLC % PRED: 99 %
TLC: 5.79 L

## 2016-03-16 LAB — COMPREHENSIVE METABOLIC PANEL
ALBUMIN: 3.7 g/dL (ref 3.5–5.0)
ALK PHOS: 95 U/L (ref 38–126)
ALT: 21 U/L (ref 17–63)
ANION GAP: 10 (ref 5–15)
AST: 27 U/L (ref 15–41)
BILIRUBIN TOTAL: 0.7 mg/dL (ref 0.3–1.2)
BUN: 9 mg/dL (ref 6–20)
CALCIUM: 9.2 mg/dL (ref 8.9–10.3)
CO2: 24 mmol/L (ref 22–32)
Chloride: 107 mmol/L (ref 101–111)
Creatinine, Ser: 0.71 mg/dL (ref 0.61–1.24)
GFR calc Af Amer: >60 mL/min (ref >60)
GFR calc non Af Amer: >60 mL/min (ref >60)
GLUCOSE: 90 mg/dL (ref 65–99)
POTASSIUM: 3.6 mmol/L (ref 3.5–5.1)
SODIUM: 141 mmol/L (ref 135–145)
TOTAL PROTEIN: 6.9 g/dL (ref 6.5–8.1)

## 2016-03-16 LAB — VAS US DOPPLER PRE CABG
LCCADSYS: -90 cm/s
LCCAPDIAS: 9 cm/s
LCCAPSYS: 145 cm/s
LEFT ECA DIAS: -7 cm/s
LEFT VERTEBRAL DIAS: 10 cm/s
LICADDIAS: -21 cm/s
Left CCA dist dias: -9 cm/s
Left ICA dist sys: -156 cm/s
Left ICA prox dias: -16 cm/s
Left ICA prox sys: -136 cm/s
RCCADSYS: -98 cm/s
RCCAPDIAS: 2 cm/s
RIGHT VERTEBRAL DIAS: 6 cm/s
Right CCA prox sys: 98 cm/s

## 2016-03-16 LAB — BLOOD GAS, ARTERIAL
ACID-BASE EXCESS: 3.6 mmol/L — AB (ref 0.0–2.0)
Bicarbonate: 27.9 mEq/L — ABNORMAL HIGH (ref 20.0–24.0)
DRAWN BY: 42180
FIO2: 0.21
O2 SAT: 92.4 %
PATIENT TEMPERATURE: 98.6
TCO2: 29.3 mmol/L (ref 0–100)
pCO2 arterial: 44.8 mmHg (ref 35.0–45.0)
pH, Arterial: 7.411 (ref 7.350–7.450)
pO2, Arterial: 64.9 mmHg — ABNORMAL LOW (ref 80.0–100.0)

## 2016-03-16 LAB — SURGICAL PCR SCREEN
MRSA, PCR: NEGATIVE
STAPHYLOCOCCUS AUREUS: NEGATIVE

## 2016-03-16 LAB — PROTIME-INR
INR: 0.99 (ref 0.00–1.49)
Prothrombin Time: 13.3 seconds (ref 11.6–15.2)

## 2016-03-16 LAB — CBC
HEMATOCRIT: 39 % (ref 39.0–52.0)
HEMOGLOBIN: 12.4 g/dL — AB (ref 13.0–17.0)
MCH: 30 pg (ref 26.0–34.0)
MCHC: 31.8 g/dL (ref 30.0–36.0)
MCV: 94.2 fL (ref 78.0–100.0)
Platelets: 232 10*3/uL (ref 150–400)
RBC: 4.14 MIL/uL — ABNORMAL LOW (ref 4.22–5.81)
RDW: 15 % (ref 11.5–15.5)
WBC: 5.3 10*3/uL (ref 4.0–10.5)

## 2016-03-16 LAB — ABO/RH: ABO/RH(D): B POS

## 2016-03-16 LAB — APTT: aPTT: 31 seconds (ref 24–37)

## 2016-03-16 MED ORDER — ALBUTEROL SULFATE (2.5 MG/3ML) 0.083% IN NEBU
2.5000 mg | INHALATION_SOLUTION | Freq: Once | RESPIRATORY_TRACT | Status: AC
  Administered 2016-03-16: 2.5 mg via RESPIRATORY_TRACT

## 2016-03-16 NOTE — Progress Notes (Signed)
PCP - [facility] Cardiologist - denies  Chest x-ray - 03/16/16 pending EKG - 03/01/16 Stress Test - >10 years ago ECHO - 02/06/16 Cardiac Cath - 02/09/16    Patient denies shortness of breath, fever, cough and chest pain at PAT appointment

## 2016-03-16 NOTE — H&P (Signed)
Alexis [name]       Alexis [name],Alexis [name] [id number]             [phone]          CARDIOTHORACIC SURGERY HISTORY AND PHYSICAL EXAM  Referring Provider is Alexis Latch, Alexis [name]  Primary Cardiologist is Alexis Breed [name], Alexis [name] PCP is Alexis Farm, Alexis [name]  Chief Complaint  Patient presents with  . Aortic Insuffiency    severe AI, eval for AVR, review TEE    HPI:  Patient is a 65 year old African-American male with history of hypertension, hepatitis C, seizure disorder, and recent stroke who has been referred for surgical consultation to discuss treatment options for management of severe symptomatic aortic insufficiency with acute combined systolic and diastolic congestive heart failure. The patient is a service connected veteran and has been followed through the [place] medical system for many years. He is not certain about any previous diagnosis of heart valve disease, but he thinks that his primary care physician may have told [name] that he had a heart murmur in the past. He had never been formally evaluated by a cardiologist until recently. The patient presented acutely for hospital admission on 10/15/2015 with severe resting shortness of breath, cough, chest pain, headache, nausea, vomiting, and diarrhea. He described a 3 month history of anorexia and associated 35 pounds weight loss. He reports history of low-grade fevers and night sweats. Part to his hospital admission he had been treated as an outpatient with oral antibiotics for a presumed urinary tract infection. The patient was diagnosed with acute hypoxic respiratory failure secondary to acute congestive heart failure with possible bronchitis. BNP level was elevated 2457. CT angiogram of the chest reveals an artifact in the left lower lobe pulmonary artery that was felt to be an artifact and not consistent with acute pulmonary embolus. There was cardiac enlargement and pulmonary edema. Transthoracic echocardiogram revealed  severe aortic insufficiency with moderate global left ventricular systolic dysfunction, ejection fraction estimated 40-45%, severe pulmonary hypertension and moderate tricuspid regurgitation. Cardiology was not consulted. The patient's shortness of breath improved with medical therapy including antibiotics, bronchodilators, lisinopril, and Lasix. MRI of the brain was performed at the time of the patient's initial presentation in notable for a small acute infarct in the right frontal lobe. The patient had reportedly experienced a transient episode of leg weakness that had resolved. The patient also states he had some difficulty with speech. The patient was evaluated by neurology and a thorough search for possible source of embolization was recommended. CT angiogram of the head and neck was unrevealing. A transesophageal echocardiogram was not performed. Blood cultures were not obtained. The patient was discharged from the hospital on oral azithromycin for presumed bronchitis.  The patient was readmitted to the hospital 02/05/2016 with severe exertional shortness of breath, orthopnea, and PND. BNP level was greater than 4500. Follow-up transthoracic echocardiogram revealed severe aortic insufficiency. There was at least moderate left ventricular systolic dysfunction with ejection fraction estimated 35-40%. The patient was evaluated by cardiology and treated with intravenous Lasix with good response. He underwent left and right heart catheterization 02/09/2016 revealing essentially normal coronary arteries with 90% stenosis of a very small-caliber third obtuse marginal branch of the left circumflex and otherwise minimal nonobstructive disease. There was moderate to severe left ventricular systolic dysfunction with severe aortic insufficiency. There was mild pulmonary hypertension. The patient developed right groin pseudoaneurysm following that required ultrasound-guided thrombosis. Transesophageal echocardiogram  was performed prior to hospital discharge  and confirmed the presence of severe aortic insufficiency. No obvious vegetations were seen. The patient diuresed more than 20 pounds of fluid and experienced significant clinical improvement. He was discharged from the hospital with plans for elective cardiothoracic surgical consultation as an outpatient.  The patient is married but has been separated from his wife for several years. He lives alone in [place]. He has 3 adult children and several grandchildren. 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 He has been retired for approximately 5 years. Prior to becoming ill the patient states that he traveled fairly regularly, primarily to visit his family. He describes a 4-5 months history of anorexia, weight loss, low-grade fever, night sweats, and progressive shortness of breath. He has not had chest pain or chest tightness. He has not had dizzy spells or syncope. His breathing has been much better since his recent hospital discharge although he has gained some weight over the past week. He denies any productive cough. Orthopnea has improved. He does not have lower extremity edema. He still gets night sweats. He denies migratory arthritis or arthralgias.  Patient returns to the office today for follow-up of stage D severe symptomatic aortic insufficiency with tentative plans to proceed with elective aortic valve replacement later this week. He was last seen in our office on 02/17/2016. Since then he underwent dental extraction and has been cleared for surgery by Dr. Lawana Chambers. He returns to the office today and reports no significant problems or complaints. He has mild residual soreness in his mouth from recent dental extraction. He has not had any significant bleeding. He states that he still gets short of breath intermittently but his breathing remains stable and noticeably much better than it was prior to his previous hospitalization. He  denies any resting shortness of breath. He has not had any fevers, chills, or productive cough. Appetite is good.           Past Medical History  Diagnosis Date  . Hepatitis C   . Hypertension   . Headache   . Chronic systolic CHF (congestive heart failure) ([place])   . Severe aortic regurgitation   . Mitral regurgitation   . Tricuspid regurgitation   . Stroke (cerebrum) ([place]) 10/2015  . Protein calorie malnutrition ([place])   . Nephrolithiasis   . Seizure ([place])   . Shortness of breath dyspnea     LYING FLAT   . Depression   . GERD (gastroesophageal reflux disease)     Past Surgical History  Procedure Laterality Date  . Rotator cuff repair    . Cardiac catheterization N/A 02/09/2016    Procedure: Right/Left Heart Cath and Coronary Angiography;  Surgeon: Alexis Man, Alexis [name];  Location: [facility];  Service: Cardiovascular;  Laterality: N/A;  . Tee without cardioversion N/A 02/11/2016    Procedure: TRANSESOPHAGEAL ECHOCARDIOGRAM (TEE);  Surgeon: Alexis Dresser, Alexis [name];  Location: [facility];  Service: Cardiovascular;  Laterality: N/A;  . Fracture surgery      RT ARM  . Legs      BIL FX   . Lithotripsy    . Multiple extractions with alveoloplasty N/A 03/01/2016    Procedure: Extraction of tooth #'s 2,3,76,28,31,51,76 with alveoloplasty and gross debridement of remaining teeth.;  Surgeon: [name], DDS;  Location: [facility];  Service: Oral Surgery;  Laterality: N/A;    Family History  Problem Relation Age of Onset  . Breast cancer Mother   . Aneurysm Mother  Brain aneurysm  . Hypertension Father   . Cancer Father     Pancreatic cancer  . Hypertension Brother     Social History Social History  Substance Use Topics  . Smoking status: Former Smoker -- 0.50 packs/day for 6 years    Types: Cigarettes    Quit date: 09/07/1971  . Smokeless tobacco: Former Systems developer    Types: Chew  . Alcohol Use: 0.0 oz/week    0 Standard drinks or equivalent per week     Comment:  occl    Prior to Admission medications   Medication Sig Start Date End Date Taking? Authorizing Provider  aspirin EC 325 MG EC tablet Take 1 tablet (325 mg total) by mouth daily. 02/11/16   Velvet Bathe, Alexis [name]  atorvastatin (LIPITOR) 10 MG tablet Take 1 tablet (10 mg total) by mouth daily at 6 PM. 10/18/15   Maryann Mikhail, DO  carvedilol (COREG) 3.125 MG tablet Take 1 tablet (3.125 mg total) by mouth 2 (two) times daily. 02/20/16   Almyra Deforest, PA  chlorhexidine (PERIDEX) 0.12 % solution Rinse with 15 mls twice daily for 30 seconds. Use after breakfast and at bedtime. Spit out excess. Do not swallow. 03/15/16 09/15/16  [name], DDS  feeding supplement, ENSURE ENLIVE, (ENSURE ENLIVE) LIQD Take 237 mLs by mouth 3 (three) times daily between meals. 10/18/15   Maryann Mikhail, DO  furosemide (LASIX) 40 MG tablet Take 1 tablet (40 mg total) by mouth daily. 02/11/16   Velvet Bathe, Alexis [name]  lisinopril (PRINIVIL,ZESTRIL) 40 MG tablet Take 1 tablet (40 mg total) by mouth daily. 10/18/15   Maryann Mikhail, DO    No Known Allergies   Review of Systems:  General:decreased appetite, decreased energy, + weight gain, + weight loss, + low grade fever Cardiac:no chest pain with exertion, no chest pain at rest, +SOB with exertion, + resting SOB, + PND, + orthopnea, no palpitations, no arrhythmia, no atrial fibrillation, no LE edema, no dizzy spells, no syncope Respiratory:+ shortness of breath, no home oxygen, no productive cough, + chronic dry cough, + possible bronchitis, no wheezing, no hemoptysis, no asthma, no pain with inspiration or cough, no sleep apnea, no CPAP at night FH:QRFXJOITGP difficulty swallowing, no reflux, + frequent heartburn, no hiatal hernia, no abdominal pain, no constipation, no diarrhea, no hematochezia, no hematemesis, no  melena GU:no dysuria, no frequency, no urinary tract infection, no hematuria, no enlarged prostate, no kidney stones, no kidney disease Vascular:no pain suggestive of claudication, no pain in feet, no leg cramps, no varicose veins, no DVT, no non-healing foot ulcer Neuro:+ recent stroke, no TIA's, + seizures, + headaches, no temporary blindness one eye, + slurred speech, no peripheral neuropathy, + chronic pain, no instability of gait, no memory/cognitive dysfunction Musculoskeletal:no arthritis, no joint swelling, no myalgias, no difficulty walking, normal mobility  Skin:+ rash, no itching, no skin infections, no pressure sores or ulcerations Psych:+ anxiety, + depression, no nervousness, no unusual recent stress Eyes:+ blurry vision, no floaters, no recent vision changes, + wears glasses or contacts ENT:no hearing loss, no loose or painful teeth, no dentures, last saw dentist many years ago Hematologic:no easy bruising, no abnormal bleeding, no clotting disorder, + frequent epistaxis Endocrine:no diabetes, does not check CBG's at home   Physical Exam:  BP 155/70 mmHg  Pulse 76  Resp 19  Ht _0  (1.626 m)  Wt 121 lb (54.885 kg)  BMI 20.76 kg/m2  SpO2 98% General:Thin, well-appearing HEENT:Unremarkable  Neck:no JVD, no bruits, no adenopathy  Chest:clear to auscultation, symmetrical breath sounds, no wheezes, no rhonchi   CV:RRR, grade IV/VI holodiastolic murmur with water hammer pulse Abdomen:soft, non-tender, no masses  Extremities:warm, well-perfused, pulses diminished but palpable, no LE edema Rectal/GUDeferred Neuro:Grossly non-focal and symmetrical throughout Skin:Clean and dry, no rashes, no breakdown   Diagnostic Tests:  Transthoracic Echocardiography  Patient: Alexis [name], Alexis [name] MR #: [id number] Study Date: 10/16/2015 Gender: M Age: 17 Height: 162.6 cm Weight: 53.1 kg BSA: 1.55 m^2 Pt. Status: Room: 3E21C  SONOGRAPHER Alexis [name] [id number] ATTENDING Alexis [name], Alexis [name] PERFORMING Alexis [name], Alexis [name]  cc:  ------------------------------------------------------------------- LV EF: 40% - 45%  ------------------------------------------------------------------- Indications: CHF - 428.0.  ------------------------------------------------------------------- History: PMH: Hepatits C. Headache. Transient ischemic attack. Risk factors: Hypertension.  ------------------------------------------------------------------- Study Conclusions  - Left ventricle: The cavity size was normal. There was moderate  concentric hypertrophy. Systolic function was mildly to  moderately reduced. The estimated ejection fraction was in the  range of 40% to 45%. Diffuse hypokinesis. - Aortic valve: Trileaflet; mildly thickened, mildly calcified  leaflets. There was severe regurgitation. - Mitral valve: There was moderate regurgitation directed  posteriorly. - Right ventricle: The cavity size was mildly dilated. Wall  thickness was normal. - Right atrium: The atrium was moderately dilated. - Tricuspid valve:  There was moderate regurgitation. - Pulmonary arteries: Systolic pressure was severely increased. PA  peak pressure: 81 mm Hg (S).  Transthoracic echocardiography. M-mode, complete 2D, spectral Doppler, and color Doppler. Birthdate: Patient birthdate: 03-13-1951. Age: Patient is 65 yr old. Sex: Gender: male. BMI: 20.1 kg/m^2. Blood pressure: 148/56 Patient status: Alexis [name]. Study date: Study date: 10/16/2015. Study time: 02:39 PM. Location: Bedside.  -------------------------------------------------------------------  ------------------------------------------------------------------- Left ventricle: The cavity size was normal. There was moderate concentric hypertrophy. Systolic function was mildly to moderately reduced. The estimated ejection fraction was in the range of 40% to 45%. Diffuse hypokinesis.  ------------------------------------------------------------------- Aortic valve: Trileaflet; mildly thickened, mildly calcified leaflets. Mobility was not restricted. Doppler: Transvalvular velocity was within the normal range. There was no stenosis. There was severe regurgitation.  ------------------------------------------------------------------- Aorta: Aortic root: The aortic root was normal in size.  ------------------------------------------------------------------- Mitral valve: Mildly thickened leaflets . Mobility was not restricted. Doppler: Transvalvular velocity was within the normal range. There was no evidence for stenosis. There was moderate regurgitation directed posteriorly.  ------------------------------------------------------------------- Left atrium: The atrium was normal in size.  ------------------------------------------------------------------- Right ventricle: The cavity size was mildly dilated. Wall thickness was normal. Systolic function was  normal.  ------------------------------------------------------------------- Pulmonic valve: Poorly visualized. Structurally normal valve. Cusp separation was normal. Doppler: Transvalvular velocity was within the normal range. There was no evidence for stenosis. There was no regurgitation.  ------------------------------------------------------------------- Tricuspid valve: Structurally normal valve. Doppler: Transvalvular velocity was within the normal range. There was moderate regurgitation.  ------------------------------------------------------------------- Pulmonary artery: The main pulmonary artery was normal-sized. Systolic pressure was severely increased.  ------------------------------------------------------------------- Right atrium: The atrium was moderately dilated.  ------------------------------------------------------------------- Pericardium: There was no pericardial effusion.  ------------------------------------------------------------------- Systemic veins: Inferior vena cava: The vessel was dilated. The respirophasic diameter changes were blunted (< 50%), consistent with elevated central venous pressure.  ------------------------------------------------------------------- Measurements  Left ventricle Value Reference LV ID, ED, PLAX chordal (H) 57.9 mm 43 - 52 LV ID, ES, PLAX chordal (H) 44.5 mm 23 - 38 LV fx shortening, PLAX chordal (L) 23 % >=29 LV PW thickness, ED 12.7 mm --------- IVS/LV PW ratio, ED 1.18 <=1.3 LV e&', lateral 8.81 cm/s --------- LV e&', medial 9.79 cm/s --------- LV e&', average 9.3 cm/s ---------  Ventricular septum  Value Reference IVS thickness, ED 15 mm ---------  LVOT Value Reference LVOT ID, S 25 mm --------- LVOT area 4.91 cm^2 ---------  Aortic valve Value Reference Aortic regurg peak velocity 173 cm/s --------- Aortic regurg pressure half-time 229 ms --------- Aortic regurg peak gradient 12 mm Hg ---------  Aorta Value Reference Aortic root ID, ED 34 mm ---------  Left atrium Value Reference LA ID, A-P, ES 36 mm --------- LA ID/bsa, A-P (H) 2.33 cm/m^2 <=2.2 LA volume, S 137 ml --------- LA volume/bsa, S 88.6 ml/m^2 --------- LA volume, ES, 1-p A4C 125 ml --------- LA volume/bsa, ES, 1-p A4C 80.9 ml/m^2 --------- LA volume, ES, 1-p A2C 140 ml --------- LA volume/bsa, ES, 1-p A2C 90.6 ml/m^2 ---------  Mitral valve Value Reference Mitral maximal regurg velocity, 528 cm/s --------- PISA Mitral regurg VTI, PISA 162 cm --------- Mitral ERO, PISA 0.11 cm^2 --------- Mitral regurg volume, PISA 18 ml ---------  Pulmonary arteries Value Reference PA pressure, S, DP (H) 81 mm Hg <=30  Tricuspid valve Value Reference Tricuspid regurg peak  velocity 406 cm/s --------- Tricuspid peak RV-RA gradient 66 mm Hg ---------  Systemic veins Value Reference Estimated CVP 15 mm Hg ---------  Right ventricle Value Reference RV pressure, S, DP (H) 81 mm Hg <=30 RV s&', lateral, S 17.1 cm/s ---------  Legend: (L) and (H) mark values outside specified reference range.  ------------------------------------------------------------------- Prepared and Electronically Authenticated by  Alexis [name], M.D. 2017-02-09T15:34:42   Transthoracic Echocardiography  Patient: Alexis [name], Alexis [name] MR #: [id number] Study Date: 02/06/2016 Gender: M Age: 26 Height: 162.6 cm Weight: 48.4 kg BSA: 1.47 m^2 Pt. Status: Room: 2H27C  ADMITTING [place], Everlene Farrier D ATTENDING [place], Anand D ORDERING [place], [place] D REFERRING [place], [place] D PERFORMING Alexis [name], Alexis [name] SONOGRAPHER Madelin Rear, RDCS  cc:  ------------------------------------------------------------------- LV EF: 35% - 40%  ------------------------------------------------------------------- Indications: CHF - 428.0.  ------------------------------------------------------------------- History: PMH: Mitral valve disease. Aortic valve disease. Risk factors: Hypertension.  ------------------------------------------------------------------- Study Conclusions  - Left ventricle: The cavity size was normal. Wall thickness was  normal. Systolic function was moderately reduced. The estimated  ejection fraction was in the range of 35% to 40%. Diffuse  hypokinesis. There was fusion of early and atrial contributions  to ventricular filling. The study is not technically sufficient  to allow  evaluation of LV diastolic function. - Aortic valve: Moderately calcified annulus. Mild diffuse  calcification. There was moderate to severe regurgitation. - Mitral valve: There was mild regurgitation. - Left atrium: The atrium was severely dilated. - Atrial septum: No defect or patent foramen ovale was identified. - Pulmonary arteries: PA peak pressure: 45 mm Hg (S).  Impressions:  - The right ventricular systolic pressure was increased consistent  with moderate pulmonary hypertension.  Transthoracic echocardiography. M-mode, complete 2D, spectral Doppler, and color Doppler. Birthdate: Patient birthdate: 05-18-1951. Age: Patient is 65 yr old. Sex: Gender: male. BMI: 18.3 kg/m^2. Blood pressure: 151/56 Patient status: Alexis [name]. Study date: Study date: 02/06/2016. Study time: 11:34 AM. Location: ICU/CCU  -------------------------------------------------------------------  ------------------------------------------------------------------- Left ventricle: The cavity size was normal. Wall thickness was normal. Systolic function was moderately reduced. The estimated ejection fraction was in the range of 35% to 40%. Diffuse hypokinesis. There was fusion of early and atrial contributions to ventricular filling. The study is not technically sufficient to allow evaluation of LV diastolic function.  ------------------------------------------------------------------- Aortic valve: Moderately calcified annulus. Mild diffuse calcification. Doppler: There was moderate to severe regurgitation.  ------------------------------------------------------------------- Mitral valve: Structurally normal valve. Leaflet separation was normal. Doppler: Transvalvular velocity was within  the normal range. There was no evidence for stenosis. There was mild regurgitation.  ------------------------------------------------------------------- Left atrium: The atrium  was severely dilated.  ------------------------------------------------------------------- Atrial septum: No defect or patent foramen ovale was identified.  ------------------------------------------------------------------- Right ventricle: The cavity size was normal. Wall thickness was normal. Systolic function was normal.  ------------------------------------------------------------------- Pulmonic valve: Structurally normal valve. Cusp separation was normal. Doppler: Transvalvular velocity was within the normal range. There was no regurgitation.  ------------------------------------------------------------------- Tricuspid valve: Structurally normal valve. Leaflet separation was normal. Doppler: Transvalvular velocity was within the normal range. There was mild regurgitation.  ------------------------------------------------------------------- Right atrium: The atrium was normal in size.  ------------------------------------------------------------------- Measurements  Left ventricle Value Reference LV ID, ED, PLAX chordal (H) 55.7 mm 43 - 52 LV ID, ES, PLAX chordal (H) 44.9 mm 23 - 38 LV fx shortening, PLAX chordal (L) 19 % >=29 LV PW thickness, ED 8.13 mm --------- IVS/LV PW ratio, ED 1.08 <=1.3 LV ejection fraction, 1-p A4C 22 % --------- LV end-diastolic volume, 2-p 539 ml --------- LV end-systolic volume, 2-p 767 ml --------- LV ejection fraction, 2-p 23 % --------- Stroke volume, 2-p 58 ml --------- LV end-diastolic volume/bsa, 2-p 341 ml/m^2 --------- LV end-systolic volume/bsa, 2-p 937 ml/m^2 --------- Stroke volume/bsa, 2-p 39.4 ml/m^2  --------- LV e&', lateral 4.13 cm/s ---------  Ventricular septum Value Reference IVS thickness, ED 8.74 mm ---------  LVOT Value Reference LVOT ID, S 21 mm --------- LVOT area 3.46 cm^2 ---------  Aortic valve Value Reference Aortic regurg pressure half-time 298 ms ---------  Aorta Value Reference Aortic root ID, ED 34 mm ---------  Left atrium Value Reference LA ID, A-P, ES 31 mm --------- LA ID/bsa, A-P 2.11 cm/m^2 <=2.2 LA volume, S 102 ml --------- LA volume/bsa, S 69.3 ml/m^2 --------- LA volume, ES, 1-p A4C 97.6 ml --------- LA volume/bsa, ES, 1-p A4C 66.3 ml/m^2 --------- LA volume, ES, 1-p A2C 99.1 ml --------- LA volume/bsa, ES, 1-p A2C 67.3 ml/m^2 ---------  Mitral valve Value Reference Mitral regurg VTI, PISA 198 cm --------- Mitral ERO, PISA 0.16 cm^2 --------- Mitral regurg volume, PISA 32 ml ---------  Pulmonary arteries Value Reference PA pressure, S, DP (H) 45 mm Hg <=30  Systemic veins Value Reference Estimated CVP 5 mm Hg ---------  Right ventricle Value Reference TAPSE 27 mm  --------- RV s&', lateral, S 11.5 cm/s ---------  Legend: (L) and (H) mark values outside specified reference range.  ------------------------------------------------------------------- Prepared and Electronically Authenticated by  Alexis Him, Alexis [name] 2017-06-02T15:27:21    CARDIAC CATHETERIZATION Procedures    Right/Left Heart Cath and Coronary Angiography    Conclusion     Essentially normal coronary arteries with only one notable lesion in a very small caliber third OM branch with 90% stenosis  There is moderate to severe left ventricular systolic dysfunction and moderate to severe aortic regurgitation by echocardiogram. Follow-up echocardiogram this coming weekend or later on this week.   Thankfully, would appear that the patient's symptoms dyspnea and chest discomfort are not likely the result of significant coronary disease.  Continued evaluation of aortic insufficiency. He appears to be euvolemic.  Plan: Return to nursing unit for post catheterization care and TR band removal.  He appears to be adequately diuresed.  Defer further evaluation to cardiology consult primary service.    Glenetta Hew, M.D., M.S. Interventional Cardiologist   Pager # [phone] Phone # [phone] [address] [place], [place] [address]      Indications    Congestive dilated cardiomyopathy (HCC) [I42.0 (ICD-10-CM)]   Aortic  regurgitation [I35.1 (ICD-10-CM)]    Technique and Indications    PCP: Alexis Farm, Alexis [name] REFERRING Alexis [name]: Alexis [name] CARDIOLOGIST: New - Dr. Stanford Breed.  65 year old male with past medical history of recent CVA, hypertension, hepatitis C for evaluation of congestive heart failure. Patient was admitted in February 2017 with complaints of dyspnea. He was also noted to have weight loss. His BNP was elevated. Chest CT showed question filling defect felt likely artifact. There were small  bilateral pleural effusions. He was also found to have a CVA by MRI. Echocardiogram revealed ejection fraction 40- 45%, moderate left ventricular hypertrophy, severe aortic insufficiency, biatrial enlargement, moderate tricuspid regurgitation and severely elevated pulmonary pressures. He was treated with antibiotics, bronchodilators, lisinopril and Lasix. Patient has chronic dyspnea on exertion. However over the last several months this has worsened. He also describes orthopnea and PND. He denies pedal edema. He has had occasional bouts of chest pain for years not exertional. He does not have exertional chest pain. No syncope. No fevers but he has had subjective chills. He was seenby primary care and cardiology was asked to evaluate.  As part of his evaluation for aortic insufficiency, he is referred for right left heart catheterization.   PROCEDURE Estimated blood loss <50 mL. There were no immediate complications during the procedure.  Time Out: Verified patient identification, verified procedure, site/side was marked, verified correct patient position, special equipment/implants available, medications/allergies/relevent history reviewed, required imaging and test results available. Performed.  During this procedure the patient is administered a total of Versed 3 mg and Fentanyl 75 mg to achieve and maintain moderate conscious sedation. The patient's heart rate, blood pressure, and oxygen saturation are monitored continuously during the procedure. The period of conscious sedation is 44 minutes, of which I was present face-to-face 100% of this time.  Access:  RIGHT Radial Artery: 6 Fr sheath -- Seldinger technique using Angiocath Micropuncture Kit 10 mL radial cocktail IA; 2500 Units IV Heparin  Right Common Femoral Vein: 7 Fr Sheath - Seldinger Technique..   Right Heart Catheterization: 7 Fr Gordy Councilman catheter advanced under fluoroscopy with balloon inflated to the RA, RV, then PCWP-PA for  hemodynamic measurement.  Simultaneous FA & PA blood gases checked for SaO2% to calculate FICK CO/CI  Thermodilution Injections performed to calculate CO/CI  Simultaneous PCWP/LV & RV/LV pressures monitored with Angled Pigtail in LV.  Catheter removed completely out of the body with balloon deflated.  Left Heart Catheterization: 5Fr Catheters advanced or exchanged over a J-wire under direct fluoroscopic guidance into the ascending aorta; TIG 4.0 catheter advanced firsvv.0 Left & RIGHT Coronary Artery Cineangiography: TIG 4.0 Catheter  Right Coronary Artery, SVG-RCA & SVG-OM Cineangiography: n/a  Femoral Venous Sheath(s) removed in the PACU Holding Area with manual pressure for hemostasis.   Radial sheath removed in the Cardiac Catheterization lab with TR Band placed for hemostasis.  TR Band: 1350 Hours; 17 mL air  MEDICATIONS * 75 g IV fentanyl, 3 mgIV Versed * SQ Lidocaine 10m for radial, 18 mL for femoral vein * Radial Cocktail: 3 mg in verapamil 10 mL NS * Isovue Contrast: 70 * Heparin: 2500 Units    Coronary Findings    Dominance: Right   Left Main  . Vessel is large.     Left Anterior Descending   . First Diagonal Branch   The vessel is small in size.   . First Septal Branch   The vessel is small in size.   .Marland KitchenSecond Diagonal Branch   The vessel is  moderate in size.   Marland Kitchen Second Septal Branch   The vessel is small in size.   . Third Diagonal Branch   The vessel is small in size.   . Third Septal Branch   The vessel is small in size.     Left Circumflex   . Lateral First Obtuse Marginal Branch   The vessel is small in size.   . Third Obtuse Marginal Branch   The vessel is small in size.   . 3rd Mrg lesion, 99% stenosed. Diffuse. Feels very faintly to distal.     Right Coronary Artery  . Vessel is large. Vessel is angiographically normal. The vessel is tortuous.   . Acute Marginal Branch   The  vessel is small in size.   . Right Posterior Atrioventricular Branch   The vessel is moderate in size.   . First Right Posterolateral   The vessel is small in size.   Marland Kitchen Second Right Posterolateral   The vessel is moderate in size.   . Third Right Posterolateral   The vessel is large in size.       Right Heart Pressures Normal right heart cath pressures LV EDP is normal.    Left Heart    Left Ventricle There is moderate to severe left ventricular systolic dysfunction. EF roughly 35% by echo The ejection fraction could not be assess due to No LV gram.   Aortic Valve There is no aortic valve stenosis. The aortic valve is calcified. Moderate-severe by echo    Coronary Diagrams    Diagnostic Diagram            Implants    No implant documentation for this case.    PACS Images    Show images for Cardiac catheterization     Link to Procedure Log    Procedure Log      Hemo Data       Most Recent Value   Fick Cardiac Output  3.69 L/min   Fick Cardiac Output Index  2.43 (L/min)/BSA   Thermal Cardiac Output  4.06 L/min   Thermal Cardiac Output Index  2.67 (L/min)/BSA   Mitral Mean Gradient  4.1 mmHg   Mitral Peak Gradient  0 mmHg   Mitral Valve Area Index  1.72 cm2/BSA   RA A Wave  6 mmHg   RA V Wave  5 mmHg   RA Mean  4 mmHg   RV Systolic Pressure  28 mmHg   RV Diastolic Pressure  3 mmHg   RV EDP  6 mmHg   PA Systolic Pressure  31 mmHg   PA Diastolic Pressure  13 mmHg   PA Mean  20 mmHg   PW A Wave  13 mmHg   PW V Wave  10 mmHg   PW Mean  10 mmHg   LV Systolic Pressure  299 mmHg   LV Diastolic Pressure  3 mmHg   LV EDP  8 mmHg   Arterial Occlusion Pressure Extended Systolic Pressure  371 mmHg   Arterial Occlusion Pressure Extended Diastolic Pressure  47 mmHg    Arterial Occlusion Pressure Extended Mean Pressure  76 mmHg   Left Ventricular Apex Extended Systolic Pressure  696 mmHg   Left Ventricular Apex Extended Diastolic Pressure  4 mmHg   Left Ventricular Apex Extended EDP Pressure  8 mmHg   TPVR Index  7.49 HRUI     Procedure: TEE  Sedation: Versed 5 mg IV, Fentanyl 50 mcg   Indication: Aortic  insufficiency  Findings: Please see echo section for full report. Mildly dilated LV with mild LV hypertrophy. EF 35%, diffuse hypokinesis. Normal RV size and systolic function. Normal LA size with no LAA thrombus. Normal RA size. Trivial TR. Trivial MR. The aortic valve was trileaflet but leaflets appeared redundant. There was severe, eccentric aortic insufficiency. No aortic stenosis. Unfortunately, the TEE machine used does not have 3D. The aortic root measured 3.6 cm and the ascending aorta was not dilated. No PFO/ASD (negative bubble study). There was holodiastolic flow reversal in the ascending aorta and partial diastolic flow reversal in the descending thoracic aorta.   Impression: Severe aortic insufficiency.   Loralie Champagne 02/11/2016        Impression:  Patient has stage D severe symptomatic aortic insufficiency. I have personally reviewed the patient's chart, transthoracic and transesophageal echocardiograms, CT angiogram of the chest, MRI of the brain, and diagnostic cardiac catheterization. At the time the patient's most recent hospitalization he presented with acute combined systolic and diastolic congestive heart failure, New York Heart Association functional class IV. Symptoms improved quickly with diuretic therapy. His previous clinical presentation is somewhat suspicious for the possibility of subacute bacterial endocarditis, although blood cultures were not obtained when he was hospitalized in February, blood cultures obtained during his recent hospitalization were negative, and transesophageal  echocardiogram did not reveal the presence of obvious vegetations. Transesophageal echocardiogram confirmed the presence of essentially wide open aortic insufficiency with possible perforation of at least one of the leaflets of the aortic valve. There is moderate left ventricular systolic dysfunction. There is probably moderate tricuspid regurgitation.  Plan:  I have again reviewed the indications, risks, and potential benefits of surgery with the patient. The patient's daughter listened in over the telephone for our conversation. Discussion was held comparing the relative risks of mechanical valve replacement with need for lifelong anticoagulation versus use of a bioprosthetic tissue valve and the associated potential for late structural valve deterioration and failure. This discussion was placed in the context of the patient's particular circumstances, and as a result the patient specifically requests that their valve be replaced using a bioprosthetic tissue valve . The patient also understands that though we do not anticipate need for tricuspid valve repair, we will reassess the severity of tricuspid regurgitation at the time of surgery and proceed with valve repair if indicated. The patient understands and accepts all potential associated risks of surgery including but not limited to risk of death, stroke, myocardial infarction, congestive heart failure, respiratory failure, renal failure, pneumonia, bleeding requiring blood transfusion and or reexploration, arrhythmia, heart block or bradycardia requiring permanent pacemaker, aortic dissection or other major vascular complication, pleural effusions or other delayed complications related to continued congestive heart failure, and other late complications related to valve replacement including structural valve deterioration and failure, thrombosis, endocarditis, or paravalvular leak.    Valentina Gu. [name], Alexis [name] 03/16/2016 3:46 PM

## 2016-03-16 NOTE — Progress Notes (Signed)
[address]       [place]  [address]             [phone]     CARDIOTHORACIC SURGERY OFFICE NOTE  Referring Provider is [name], MD PCP is [name], MD   HPI:  Patient returns to the office today for follow-up of stage D severe symptomatic aortic insufficiency with tentative plans to proceed with elective aortic valve replacement later this week. He was last seen in our office on 02/17/2016. Since then he underwent dental extraction and has been cleared for surgery by Dr. Lawana Chambers. He returns to the office today and reports no significant problems or complaints. He has mild residual soreness in his mouth from recent dental extraction. He has not had any significant bleeding. He states that he still gets short of breath intermittently but his breathing remains stable and noticeably much better than it was prior to his previous hospitalization. He denies any resting shortness of breath. He has not had any fevers, chills, or productive cough. Appetite is good.   Current Outpatient Prescriptions  Medication Sig Dispense Refill  . aspirin EC 325 MG EC tablet Take 1 tablet (325 mg total) by mouth daily. 30 tablet 0  . atorvastatin (LIPITOR) 10 MG tablet Take 1 tablet (10 mg total) by mouth daily at 6 PM. 30 tablet 0  . carvedilol (COREG) 3.125 MG tablet Take 1 tablet (3.125 mg total) by mouth 2 (two) times daily. 180 tablet 3  . chlorhexidine (PERIDEX) 0.12 % solution Rinse with 15 mls twice daily for 30 seconds. Use after breakfast and at bedtime. Spit out excess. Do not swallow. 480 mL prn  . feeding supplement, ENSURE ENLIVE, (ENSURE ENLIVE) LIQD Take 237 mLs by mouth 3 (three) times daily between meals. 90 Bottle 0  . furosemide (LASIX) 40 MG tablet Take 1 tablet (40 mg total) by mouth daily. 30 tablet 0  . lisinopril (PRINIVIL,ZESTRIL) 40 MG tablet Take 1 tablet (40 mg total) by mouth daily. 30 tablet 0   No current facility-administered  medications for this visit.      Physical Exam:   BP 170/69 mmHg  Pulse 76  Resp 20  Ht 5\' 4"  (1.626 m)  Wt 119 lb (53.978 kg)  BMI 20.42 kg/m2  SpO2 99%  General:  Well appearing  Chest:   Clear to auscultation with diminished breath sounds at both lung bases  CV:   Regular rate and rhythm with prominent grade 4/6 holodiastolic murmur  Incisions:  n/a  Abdomen:  Soft and nontender  Extremities:  Warm and well-perfused, no edema  Diagnostic Tests:  CHEST 2 VIEW  COMPARISON: 02/05/2016  FINDINGS: Cardiac shadow is at the upper limits of normal in size. The thoracic aorta is tortuous. The lungs are clear bilaterally. No acute bony abnormality is seen. Postsurgical changes are noted in the proximal right humerus.  IMPRESSION: No active cardiopulmonary disease.   Electronically Signed  By: Inez Catalina M.D.  On: 03/16/2016 14:41   Impression:  Patient has stage D severe symptomatic aortic insufficiency. I have personally reviewed the patient's chart, transthoracic and transesophageal echocardiograms, CT angiogram of the chest, MRI of the brain, and diagnostic cardiac catheterization. At the time the patient's most recent hospitalization he presented with acute combined systolic and diastolic congestive heart failure, New York Heart Association functional class IV. Symptoms improved quickly with diuretic therapy. His previous clinical presentation is somewhat suspicious for the possibility of subacute bacterial endocarditis,  although blood cultures were not obtained when he was hospitalized in February, blood cultures obtained during his recent hospitalization were negative, and transesophageal echocardiogram did not reveal the presence of obvious vegetations. Transesophageal echocardiogram confirmed the presence of essentially wide open aortic insufficiency with possible perforation of at least one of the leaflets of the aortic valve. There is moderate left  ventricular systolic dysfunction.  There is probably moderate tricuspid regurgitation.  Plan:  I have again reviewed the indications, risks, and potential benefits of surgery with the patient. The patient's daughter listened in over the telephone for our conversation.  Discussion was held comparing the relative risks of mechanical valve replacement with need for lifelong anticoagulation versus use of a bioprosthetic tissue valve and the associated potential for late structural valve deterioration and failure.  This discussion was placed in the context of the patient's particular circumstances, and as a result the patient specifically requests that their valve be replaced using a bioprosthetic tissue valve .   The patient also understands that though we do not anticipate need for tricuspid valve repair, we will reassess the severity of tricuspid regurgitation at the time of surgery and proceed with valve repair if indicated. The patient understands and accepts all potential associated risks of surgery including but not limited to risk of death, stroke, myocardial infarction, congestive heart failure, respiratory failure, renal failure, pneumonia, bleeding requiring blood transfusion and or reexploration, arrhythmia, heart block or bradycardia requiring permanent pacemaker, aortic dissection or other major vascular complication, pleural effusions or other delayed complications related to continued congestive heart failure, and other late complications related to valve replacement including structural valve deterioration and failure, thrombosis, endocarditis, or paravalvular leak.    I spent in excess of 30 minutes during the conduct of this office consultation and >50% of this time involved direct face-to-face encounter with the patient for counseling and/or coordination of their care.    Valentina Gu. [name], MD 03/16/2016 3:46 PM

## 2016-03-16 NOTE — Progress Notes (Signed)
Pre-op Cardiac Surgery  Carotid Findings:  Bilateral: No significant (1-39%) ICA stenosis. Antegrade vertebral flow.    Upper Extremity Right Left  Brachial Pressures 170 179  Radial Waveforms Tri Tri  Ulnar Waveforms Tri Tri  Palmar Arch (Allen's Test) Normal  Decreases >50% with radial compression, normal with ulnar compression    Landry Mellow, RDMS, RVT 03/16/2016

## 2016-03-16 NOTE — Patient Instructions (Signed)
Patient has been instructed to stop taking aspirin  Patient should continue taking all other medications without change through the day before surgery.  Patient should have nothing to eat or drink after midnight the night before surgery.  On the morning of surgery patient should take only carvedilol (Coreg) with a sip of water.

## 2016-03-17 LAB — HEMOGLOBIN A1C
Hgb A1c MFr Bld: 5 % (ref 4.8–5.6)
Mean Plasma Glucose: 97 mg/dL

## 2016-03-17 MED ORDER — MAGNESIUM SULFATE 50 % IJ SOLN
40.0000 meq | INTRAMUSCULAR | Status: DC
  Filled 2016-03-17: qty 10

## 2016-03-17 MED ORDER — POTASSIUM CHLORIDE 2 MEQ/ML IV SOLN
80.0000 meq | INTRAVENOUS | Status: DC
  Filled 2016-03-17: qty 40

## 2016-03-17 MED ORDER — VANCOMYCIN HCL 1000 MG IV SOLR
INTRAVENOUS | Status: AC
  Administered 2016-03-18: 1000 mL
  Filled 2016-03-17: qty 1000

## 2016-03-17 MED ORDER — DEXTROSE 5 % IV SOLN
750.0000 mg | INTRAVENOUS | Status: DC
  Filled 2016-03-17: qty 750

## 2016-03-17 MED ORDER — DOPAMINE-DEXTROSE 3.2-5 MG/ML-% IV SOLN
0.0000 ug/kg/min | INTRAVENOUS | Status: DC
  Filled 2016-03-17: qty 250

## 2016-03-17 MED ORDER — METOPROLOL TARTRATE 12.5 MG HALF TABLET: 12.5000 mg | ORAL_TABLET | Freq: Once | ORAL | Status: DC

## 2016-03-17 MED ORDER — PLASMA-LYTE 148 IV SOLN
INTRAVENOUS | Status: DC
  Filled 2016-03-17: qty 2.5

## 2016-03-17 MED ORDER — VANCOMYCIN HCL IN DEXTROSE 1-5 GM/200ML-% IV SOLN
1000.0000 mg | INTRAVENOUS | Status: DC
  Filled 2016-03-17 (×2): qty 200

## 2016-03-17 MED ORDER — SODIUM CHLORIDE 0.9 % IV SOLN
INTRAVENOUS | Status: DC
  Filled 2016-03-17: qty 2.5

## 2016-03-17 MED ORDER — SODIUM CHLORIDE 0.9 % IV SOLN
INTRAVENOUS | Status: DC
  Filled 2016-03-17: qty 30

## 2016-03-17 MED ORDER — DEXMEDETOMIDINE HCL IN NACL 400 MCG/100ML IV SOLN
0.1000 ug/kg/h | INTRAVENOUS | Status: DC
  Filled 2016-03-17: qty 100

## 2016-03-17 MED ORDER — EPINEPHRINE HCL 1 MG/ML IJ SOLN
0.0000 ug/min | INTRAVENOUS | Status: DC
  Filled 2016-03-17: qty 4

## 2016-03-17 MED ORDER — NITROGLYCERIN IN D5W 200-5 MCG/ML-% IV SOLN
2.0000 ug/min | INTRAVENOUS | Status: DC
  Filled 2016-03-17: qty 250

## 2016-03-17 MED ORDER — DEXTROSE 5 % IV SOLN
1.5000 g | INTRAVENOUS | Status: DC
  Filled 2016-03-17 (×2): qty 1.5

## 2016-03-17 MED ORDER — SODIUM CHLORIDE 0.9 % IV SOLN
INTRAVENOUS | Status: AC
  Administered 2016-03-18: 69.8 mL/h via INTRAVENOUS
  Filled 2016-03-17: qty 40

## 2016-03-17 MED ORDER — PHENYLEPHRINE HCL 10 MG/ML IJ SOLN
30.0000 ug/min | INTRAVENOUS | Status: DC
  Filled 2016-03-17: qty 2

## 2016-03-17 MED ORDER — CHLORHEXIDINE GLUCONATE 0.12 % MT SOLN
15.0000 mL | Freq: Once | OROMUCOSAL | Status: AC
  Administered 2016-03-18: 15 mL via OROMUCOSAL
  Filled 2016-03-17: qty 15

## 2016-03-18 ENCOUNTER — Inpatient Hospital Stay (HOSPITAL_COMMUNITY): Payer: Medicare HMO | Admitting: Anesthesiology

## 2016-03-18 ENCOUNTER — Inpatient Hospital Stay (HOSPITAL_COMMUNITY): Payer: Medicare HMO

## 2016-03-18 ENCOUNTER — Inpatient Hospital Stay (HOSPITAL_COMMUNITY)
Admission: RE | Admit: 2016-03-18 | Discharge: 2016-03-23 | DRG: 219 | Disposition: A | Discharge status: Home / Self Care | Payer: Medicare HMO | Source: Ambulatory Visit | Attending: Thoracic Surgery (Cardiothoracic Vascular Surgery) | Admitting: Thoracic Surgery (Cardiothoracic Vascular Surgery)

## 2016-03-18 ENCOUNTER — Encounter (HOSPITAL_COMMUNITY): Payer: Self-pay | Admitting: Surgery

## 2016-03-18 ENCOUNTER — Encounter (HOSPITAL_COMMUNITY)
Admission: RE | Disposition: A | Discharge status: Home / Self Care | Payer: Self-pay | Source: Ambulatory Visit | Attending: Thoracic Surgery (Cardiothoracic Vascular Surgery)

## 2016-03-18 DIAGNOSIS — Z953 Presence of xenogenic heart valve: Secondary | ICD-10-CM

## 2016-03-18 DIAGNOSIS — I083 Combined rheumatic disorders of mitral, aortic and tricuspid valves: Secondary | ICD-10-CM | POA: Diagnosis present

## 2016-03-18 DIAGNOSIS — Z87891 Personal history of nicotine dependence: Secondary | ICD-10-CM | POA: Diagnosis not present

## 2016-03-18 DIAGNOSIS — E43 Unspecified severe protein-calorie malnutrition: Secondary | ICD-10-CM | POA: Diagnosis present

## 2016-03-18 DIAGNOSIS — I351 Nonrheumatic aortic (valve) insufficiency: Secondary | ICD-10-CM | POA: Diagnosis present

## 2016-03-18 DIAGNOSIS — I5042 Chronic combined systolic (congestive) and diastolic (congestive) heart failure: Secondary | ICD-10-CM | POA: Diagnosis present

## 2016-03-18 DIAGNOSIS — D696 Thrombocytopenia, unspecified: Secondary | ICD-10-CM | POA: Diagnosis present

## 2016-03-18 DIAGNOSIS — Z7982 Long term (current) use of aspirin: Secondary | ICD-10-CM | POA: Diagnosis not present

## 2016-03-18 DIAGNOSIS — I110 Hypertensive heart disease with heart failure: Secondary | ICD-10-CM | POA: Diagnosis present

## 2016-03-18 DIAGNOSIS — R0602 Shortness of breath: Secondary | ICD-10-CM | POA: Diagnosis present

## 2016-03-18 DIAGNOSIS — Z8249 Family history of ischemic heart disease and other diseases of the circulatory system: Secondary | ICD-10-CM | POA: Diagnosis not present

## 2016-03-18 DIAGNOSIS — I071 Rheumatic tricuspid insufficiency: Secondary | ICD-10-CM | POA: Diagnosis present

## 2016-03-18 DIAGNOSIS — Z8673 Personal history of transient ischemic attack (TIA), and cerebral infarction without residual deficits: Secondary | ICD-10-CM | POA: Diagnosis not present

## 2016-03-18 DIAGNOSIS — Z79899 Other long term (current) drug therapy: Secondary | ICD-10-CM | POA: Diagnosis not present

## 2016-03-18 DIAGNOSIS — D62 Acute posthemorrhagic anemia: Secondary | ICD-10-CM | POA: Diagnosis not present

## 2016-03-18 DIAGNOSIS — K5900 Constipation, unspecified: Secondary | ICD-10-CM | POA: Diagnosis present

## 2016-03-18 DIAGNOSIS — K219 Gastro-esophageal reflux disease without esophagitis: Secondary | ICD-10-CM | POA: Diagnosis present

## 2016-03-18 DIAGNOSIS — F329 Major depressive disorder, single episode, unspecified: Secondary | ICD-10-CM | POA: Diagnosis present

## 2016-03-18 DIAGNOSIS — Z954 Presence of other heart-valve replacement: Secondary | ICD-10-CM | POA: Diagnosis not present

## 2016-03-18 DIAGNOSIS — I272 Other secondary pulmonary hypertension: Secondary | ICD-10-CM | POA: Diagnosis present

## 2016-03-18 DIAGNOSIS — N200 Calculus of kidney: Secondary | ICD-10-CM | POA: Diagnosis present

## 2016-03-18 DIAGNOSIS — B1920 Unspecified viral hepatitis C without hepatic coma: Secondary | ICD-10-CM | POA: Diagnosis present

## 2016-03-18 DIAGNOSIS — R072 Precordial pain: Secondary | ICD-10-CM | POA: Diagnosis not present

## 2016-03-18 DIAGNOSIS — I10 Essential (primary) hypertension: Secondary | ICD-10-CM | POA: Diagnosis not present

## 2016-03-18 DIAGNOSIS — G40909 Epilepsy, unspecified, not intractable, without status epilepticus: Secondary | ICD-10-CM | POA: Diagnosis present

## 2016-03-18 DIAGNOSIS — Z681 Body mass index (BMI) 19 or less, adult: Secondary | ICD-10-CM | POA: Diagnosis not present

## 2016-03-18 DIAGNOSIS — J9811 Atelectasis: Secondary | ICD-10-CM | POA: Diagnosis not present

## 2016-03-18 HISTORY — DX: Presence of xenogenic heart valve: Z95.3

## 2016-03-18 HISTORY — PX: TEE WITHOUT CARDIOVERSION: SHX5443

## 2016-03-18 HISTORY — PX: AORTIC VALVE REPLACEMENT: SHX41

## 2016-03-18 LAB — POCT I-STAT, CHEM 8
BUN: 8 mg/dL (ref 6–20)
BUN: 6 mg/dL (ref 6–20)
BUN: 7 mg/dL (ref 6–20)
BUN: 6 mg/dL (ref 6–20)
BUN: 4 mg/dL — ABNORMAL LOW (ref 6–20)
BUN: 7 mg/dL (ref 6–20)
CALCIUM ION: 1.19 mmol/L (ref 1.12–1.23)
CALCIUM ION: 0.99 mmol/L — AB (ref 1.12–1.23)
CALCIUM ION: 1.07 mmol/L — AB (ref 1.12–1.23)
CALCIUM ION: 0.95 mmol/L — AB (ref 1.12–1.23)
CHLORIDE: 105 mmol/L (ref 101–111)
CREATININE: 0.4 mg/dL — AB (ref 0.61–1.24)
CREATININE: 0.3 mg/dL — AB (ref 0.61–1.24)
CREATININE: 0.4 mg/dL — AB (ref 0.61–1.24)
CREATININE: 0.5 mg/dL — AB (ref 0.61–1.24)
Calcium, Ion: 1.21 mmol/L (ref 1.12–1.23)
Calcium, Ion: 1.11 mmol/L — ABNORMAL LOW (ref 1.12–1.23)
Chloride: 101 mmol/L (ref 101–111)
Chloride: 104 mmol/L (ref 101–111)
Chloride: 103 mmol/L (ref 101–111)
Chloride: 106 mmol/L (ref 101–111)
Chloride: 105 mmol/L (ref 101–111)
Creatinine, Ser: 0.3 mg/dL — ABNORMAL LOW (ref 0.61–1.24)
Creatinine, Ser: 0.6 mg/dL — ABNORMAL LOW (ref 0.61–1.24)
GLUCOSE: 121 mg/dL — AB (ref 65–99)
GLUCOSE: 147 mg/dL — AB (ref 65–99)
GLUCOSE: 106 mg/dL — AB (ref 65–99)
GLUCOSE: 109 mg/dL — AB (ref 65–99)
Glucose, Bld: 94 mg/dL (ref 65–99)
Glucose, Bld: 103 mg/dL — ABNORMAL HIGH (ref 65–99)
HCT: 30 % — ABNORMAL LOW (ref 39.0–52.0)
HCT: 26 % — ABNORMAL LOW (ref 39.0–52.0)
HCT: 24 % — ABNORMAL LOW (ref 39.0–52.0)
HEMATOCRIT: 34 % — AB (ref 39.0–52.0)
HEMATOCRIT: 28 % — AB (ref 39.0–52.0)
HEMATOCRIT: 27 % — AB (ref 39.0–52.0)
HEMOGLOBIN: 10.2 g/dL — AB (ref 13.0–17.0)
HEMOGLOBIN: 9.5 g/dL — AB (ref 13.0–17.0)
HEMOGLOBIN: 8.8 g/dL — AB (ref 13.0–17.0)
HEMOGLOBIN: 9.2 g/dL — AB (ref 13.0–17.0)
Hemoglobin: 11.6 g/dL — ABNORMAL LOW (ref 13.0–17.0)
Hemoglobin: 8.2 g/dL — ABNORMAL LOW (ref 13.0–17.0)
POTASSIUM: 3.4 mmol/L — AB (ref 3.5–5.1)
Potassium: 3.5 mmol/L (ref 3.5–5.1)
Potassium: 3.7 mmol/L (ref 3.5–5.1)
Potassium: 4.5 mmol/L (ref 3.5–5.1)
Potassium: 3.6 mmol/L (ref 3.5–5.1)
Potassium: 4.3 mmol/L (ref 3.5–5.1)
SODIUM: 143 mmol/L (ref 135–145)
SODIUM: 141 mmol/L (ref 135–145)
Sodium: 144 mmol/L (ref 135–145)
Sodium: 142 mmol/L (ref 135–145)
Sodium: 142 mmol/L (ref 135–145)
Sodium: 139 mmol/L (ref 135–145)
TCO2: 30 mmol/L (ref 0–100)
TCO2: 27 mmol/L (ref 0–100)
TCO2: 30 mmol/L (ref 0–100)
TCO2: 29 mmol/L (ref 0–100)
TCO2: 27 mmol/L (ref 0–100)
TCO2: 25 mmol/L (ref 0–100)

## 2016-03-18 LAB — CREATININE, SERUM
Creatinine, Ser: 0.64 mg/dL (ref 0.61–1.24)
GFR calc non Af Amer: >60 mL/min (ref >60)

## 2016-03-18 LAB — POCT I-STAT 3, ART BLOOD GAS (G3+)
ACID-BASE EXCESS: 3 mmol/L — AB (ref 0.0–2.0)
ACID-BASE EXCESS: 2 mmol/L (ref 0.0–2.0)
ACID-BASE EXCESS: 3 mmol/L — AB (ref 0.0–2.0)
ACID-BASE EXCESS: 3 mmol/L — AB (ref 0.0–2.0)
Acid-base deficit: 2 mmol/L (ref 0.0–2.0)
Acid-base deficit: 1 mmol/L (ref 0.0–2.0)
BICARBONATE: 25.8 meq/L — AB (ref 20.0–24.0)
BICARBONATE: 27.5 meq/L — AB (ref 20.0–24.0)
BICARBONATE: 23.2 meq/L (ref 20.0–24.0)
Bicarbonate: 28.8 mEq/L — ABNORMAL HIGH (ref 20.0–24.0)
Bicarbonate: 27.1 mEq/L — ABNORMAL HIGH (ref 20.0–24.0)
Bicarbonate: 23.3 mEq/L (ref 20.0–24.0)
O2 SAT: 100 %
O2 SAT: 98 %
O2 Saturation: 100 %
O2 Saturation: 99 %
O2 Saturation: 100 %
O2 Saturation: 99 %
PCO2 ART: 37.6 mmHg (ref 35.0–45.0)
PH ART: 7.413 (ref 7.350–7.450)
PH ART: 7.459 — AB (ref 7.350–7.450)
PH ART: 7.399 (ref 7.350–7.450)
PO2 ART: 162 mmHg — AB (ref 80.0–100.0)
PO2 ART: 108 mmHg — AB (ref 80.0–100.0)
Patient temperature: 37
TCO2: 27 mmol/L (ref 0–100)
TCO2: 29 mmol/L (ref 0–100)
TCO2: 30 mmol/L (ref 0–100)
TCO2: 28 mmol/L (ref 0–100)
TCO2: 25 mmol/L (ref 0–100)
TCO2: 24 mmol/L (ref 0–100)
pCO2 arterial: 32.9 mmHg — ABNORMAL LOW (ref 35.0–45.0)
pCO2 arterial: 43.4 mmHg (ref 35.0–45.0)
pCO2 arterial: 44.8 mmHg (ref 35.0–45.0)
pCO2 arterial: 38 mmHg (ref 35.0–45.0)
pCO2 arterial: 40.8 mmHg (ref 35.0–45.0)
pH, Arterial: 7.502 — ABNORMAL HIGH (ref 7.350–7.450)
pH, Arterial: 7.405 (ref 7.350–7.450)
pH, Arterial: 7.365 (ref 7.350–7.450)
pO2, Arterial: 337 mmHg — ABNORMAL HIGH (ref 80.0–100.0)
pO2, Arterial: 181 mmHg — ABNORMAL HIGH (ref 80.0–100.0)
pO2, Arterial: 163 mmHg — ABNORMAL HIGH (ref 80.0–100.0)
pO2, Arterial: 162 mmHg — ABNORMAL HIGH (ref 80.0–100.0)

## 2016-03-18 LAB — CBC
HEMATOCRIT: 30.5 % — AB (ref 39.0–52.0)
HEMATOCRIT: 28.3 % — AB (ref 39.0–52.0)
HEMOGLOBIN: 9.8 g/dL — AB (ref 13.0–17.0)
HEMOGLOBIN: 9.3 g/dL — AB (ref 13.0–17.0)
MCH: 29.6 pg (ref 26.0–34.0)
MCH: 30.2 pg (ref 26.0–34.0)
MCHC: 32.1 g/dL (ref 30.0–36.0)
MCHC: 32.9 g/dL (ref 30.0–36.0)
MCV: 92.1 fL (ref 78.0–100.0)
MCV: 91.9 fL (ref 78.0–100.0)
PLATELETS: 142 10*3/uL — AB (ref 150–400)
Platelets: 172 10*3/uL (ref 150–400)
RBC: 3.31 MIL/uL — AB (ref 4.22–5.81)
RBC: 3.08 MIL/uL — AB (ref 4.22–5.81)
RDW: 14.9 % (ref 11.5–15.5)
RDW: 15 % (ref 11.5–15.5)
WBC: 9 10*3/uL (ref 4.0–10.5)
WBC: 12.9 10*3/uL — ABNORMAL HIGH (ref 4.0–10.5)

## 2016-03-18 LAB — HEMOGLOBIN AND HEMATOCRIT, BLOOD
HCT: 26.4 % — ABNORMAL LOW (ref 39.0–52.0)
HEMOGLOBIN: 8.8 g/dL — AB (ref 13.0–17.0)

## 2016-03-18 LAB — MAGNESIUM: Magnesium: 2.6 mg/dL — ABNORMAL HIGH (ref 1.7–2.4)

## 2016-03-18 LAB — POCT I-STAT 4, (NA,K, GLUC, HGB,HCT)
GLUCOSE: 67 mg/dL (ref 65–99)
HEMATOCRIT: 31 % — AB (ref 39.0–52.0)
HEMOGLOBIN: 10.5 g/dL — AB (ref 13.0–17.0)
Potassium: 3.7 mmol/L (ref 3.5–5.1)
SODIUM: 143 mmol/L (ref 135–145)

## 2016-03-18 LAB — PROTIME-INR
INR: 1.42 (ref 0.00–1.49)
Prothrombin Time: 17.4 seconds — ABNORMAL HIGH (ref 11.6–15.2)

## 2016-03-18 LAB — APTT: APTT: 47 s — AB (ref 24–37)

## 2016-03-18 LAB — PLATELET COUNT: Platelets: 152 10*3/uL (ref 150–400)

## 2016-03-18 SURGERY — REPLACEMENT, AORTIC VALVE, OPEN
Anesthesia: General | Site: Chest

## 2016-03-18 MED ORDER — INSULIN REGULAR BOLUS VIA INFUSION
0.0000 [IU] | Freq: Three times a day (TID) | INTRAVENOUS | Status: DC
  Filled 2016-03-18: qty 10

## 2016-03-18 MED ORDER — MIDAZOLAM HCL 5 MG/5ML IJ SOLN
INTRAMUSCULAR | Status: DC | PRN
  Administered 2016-03-18 (×2): 1 mg via INTRAVENOUS
  Administered 2016-03-18: 2 mg via INTRAVENOUS
  Administered 2016-03-18: 3 mg via INTRAVENOUS
  Administered 2016-03-18 (×2): 2 mg via INTRAVENOUS
  Administered 2016-03-18: 1 mg via INTRAVENOUS

## 2016-03-18 MED ORDER — ALBUMIN HUMAN 5 % IV SOLN
250.0000 mL | INTRAVENOUS | Status: AC | PRN
  Administered 2016-03-18 (×3): 250 mL via INTRAVENOUS
  Filled 2016-03-18: qty 250

## 2016-03-18 MED ORDER — LACTATED RINGERS IV SOLN: 500.0000 mL | Freq: Once | INTRAVENOUS | Status: DC | PRN

## 2016-03-18 MED ORDER — ROCURONIUM BROMIDE 50 MG/5ML IV SOLN
INTRAVENOUS | Status: AC
  Filled 2016-03-18: qty 1

## 2016-03-18 MED ORDER — CHLORHEXIDINE GLUCONATE 4 % EX LIQD: 30.0000 mL | CUTANEOUS | Status: DC

## 2016-03-18 MED ORDER — ONDANSETRON HCL 4 MG/2ML IJ SOLN
4.0000 mg | Freq: Four times a day (QID) | INTRAMUSCULAR | Status: DC | PRN
  Administered 2016-03-19: 4 mg via INTRAVENOUS
  Filled 2016-03-18 (×2): qty 2

## 2016-03-18 MED ORDER — PROPOFOL 10 MG/ML IV BOLUS
INTRAVENOUS | Status: AC
  Filled 2016-03-18: qty 20

## 2016-03-18 MED ORDER — SODIUM CHLORIDE 0.9 % IJ SOLN
OROMUCOSAL | Status: DC | PRN
  Administered 2016-03-18: 09:00:00 via TOPICAL

## 2016-03-18 MED ORDER — MIDAZOLAM HCL 2 MG/2ML IJ SOLN
INTRAMUSCULAR | Status: AC
  Filled 2016-03-18: qty 2

## 2016-03-18 MED ORDER — DOCUSATE SODIUM 100 MG PO CAPS
200.0000 mg | ORAL_CAPSULE | Freq: Every day | ORAL | Status: DC
  Administered 2016-03-19 – 2016-03-23 (×5): 200 mg via ORAL
  Filled 2016-03-18 (×5): qty 2

## 2016-03-18 MED ORDER — VANCOMYCIN HCL 1000 MG IV SOLR
1000.0000 mg | INTRAVENOUS | Status: DC | PRN
  Administered 2016-03-18: 1000 mg via INTRAVENOUS

## 2016-03-18 MED ORDER — OXYCODONE HCL 5 MG PO TABS
5.0000 mg | ORAL_TABLET | ORAL | Status: DC | PRN
  Administered 2016-03-19: 10 mg via ORAL
  Administered 2016-03-19 (×2): 5 mg via ORAL
  Administered 2016-03-20 – 2016-03-22 (×6): 10 mg via ORAL
  Administered 2016-03-22: 5 mg via ORAL
  Administered 2016-03-23: 10 mg via ORAL
  Filled 2016-03-18 (×3): qty 2
  Filled 2016-03-18: qty 1
  Filled 2016-03-18 (×2): qty 2
  Filled 2016-03-18 (×2): qty 1
  Filled 2016-03-18 (×4): qty 2

## 2016-03-18 MED ORDER — FENTANYL CITRATE (PF) 250 MCG/5ML IJ SOLN
INTRAMUSCULAR | Status: AC
  Filled 2016-03-18: qty 20

## 2016-03-18 MED ORDER — LACTATED RINGERS IV SOLN: INTRAVENOUS | Status: DC

## 2016-03-18 MED ORDER — DEXTROSE 5 % IV SOLN
1.5000 g | INTRAVENOUS | Status: DC | PRN
  Administered 2016-03-18: .75 g via INTRAVENOUS
  Administered 2016-03-18: 1.5 g via INTRAVENOUS

## 2016-03-18 MED ORDER — SODIUM CHLORIDE 0.9% FLUSH
3.0000 mL | Freq: Two times a day (BID) | INTRAVENOUS | Status: DC
  Administered 2016-03-19 – 2016-03-22 (×4): 3 mL via INTRAVENOUS

## 2016-03-18 MED ORDER — CHLORHEXIDINE GLUCONATE 0.12% ORAL RINSE (MEDLINE KIT)
15.0000 mL | Freq: Two times a day (BID) | OROMUCOSAL | Status: DC
  Administered 2016-03-18 – 2016-03-22 (×7): 15 mL via OROMUCOSAL
  Filled 2016-03-18 (×2): qty 15

## 2016-03-18 MED ORDER — HEPARIN SODIUM (PORCINE) 1000 UNIT/ML IJ SOLN
INTRAMUSCULAR | Status: DC | PRN
  Administered 2016-03-18: 23000 [IU] via INTRAVENOUS

## 2016-03-18 MED ORDER — CARVEDILOL 3.125 MG PO TABS
ORAL_TABLET | ORAL | Status: AC
  Filled 2016-03-18: qty 1

## 2016-03-18 MED ORDER — METOPROLOL TARTRATE 25 MG/10 ML ORAL SUSPENSION: 12.5000 mg | Freq: Two times a day (BID) | ORAL | Status: DC

## 2016-03-18 MED ORDER — ANTISEPTIC ORAL RINSE SOLUTION (CORINZ)
7.0000 mL | Freq: Four times a day (QID) | OROMUCOSAL | Status: DC
  Administered 2016-03-19 – 2016-03-21 (×8): 7 mL via OROMUCOSAL

## 2016-03-18 MED ORDER — MIDAZOLAM HCL 10 MG/2ML IJ SOLN
INTRAMUSCULAR | Status: AC
  Filled 2016-03-18: qty 2

## 2016-03-18 MED ORDER — DEXMEDETOMIDINE HCL IN NACL 400 MCG/100ML IV SOLN
INTRAVENOUS | Status: DC | PRN
  Administered 2016-03-18: .3 ug/kg/h via INTRAVENOUS

## 2016-03-18 MED ORDER — ACETAMINOPHEN 650 MG RE SUPP
650.0000 mg | Freq: Once | RECTAL | Status: AC
  Administered 2016-03-18: 650 mg via RECTAL

## 2016-03-18 MED ORDER — DEXTROSE 5 % IV SOLN
1.5000 g | Freq: Two times a day (BID) | INTRAVENOUS | Status: AC
  Administered 2016-03-18 – 2016-03-20 (×4): 1.5 g via INTRAVENOUS
  Filled 2016-03-18 (×4): qty 1.5

## 2016-03-18 MED ORDER — FENTANYL CITRATE (PF) 100 MCG/2ML IJ SOLN
INTRAMUSCULAR | Status: DC | PRN
  Administered 2016-03-18 (×2): 50 ug via INTRAVENOUS
  Administered 2016-03-18: 250 ug via INTRAVENOUS
  Administered 2016-03-18: 1150 ug via INTRAVENOUS

## 2016-03-18 MED ORDER — TRAMADOL HCL 50 MG PO TABS
50.0000 mg | ORAL_TABLET | ORAL | Status: DC | PRN
  Administered 2016-03-19: 100 mg via ORAL
  Filled 2016-03-18: qty 2

## 2016-03-18 MED ORDER — ARTIFICIAL TEARS OP OINT
TOPICAL_OINTMENT | OPHTHALMIC | Status: DC | PRN
  Administered 2016-03-18: 1 via OPHTHALMIC

## 2016-03-18 MED ORDER — ASPIRIN EC 325 MG PO TBEC: 325.0000 mg | DELAYED_RELEASE_TABLET | Freq: Every day | ORAL | Status: DC

## 2016-03-18 MED ORDER — POTASSIUM CHLORIDE 10 MEQ/50ML IV SOLN
10.0000 meq | INTRAVENOUS | Status: AC
  Administered 2016-03-18 (×3): 10 meq via INTRAVENOUS

## 2016-03-18 MED ORDER — FAMOTIDINE IN NACL 20-0.9 MG/50ML-% IV SOLN
20.0000 mg | Freq: Two times a day (BID) | INTRAVENOUS | Status: DC
  Administered 2016-03-18: 20 mg via INTRAVENOUS

## 2016-03-18 MED ORDER — SODIUM CHLORIDE 0.9 % IV SOLN
250.0000 [IU] | INTRAVENOUS | Status: DC | PRN
  Administered 2016-03-18: 1 [IU]/h via INTRAVENOUS

## 2016-03-18 MED ORDER — ACETAMINOPHEN 160 MG/5ML PO SOLN: 650.0000 mg | Freq: Once | ORAL | Status: AC

## 2016-03-18 MED ORDER — NITROGLYCERIN IN D5W 200-5 MCG/ML-% IV SOLN
0.0000 ug/min | INTRAVENOUS | Status: DC
  Administered 2016-03-18: 85 ug/min via INTRAVENOUS
  Administered 2016-03-19: 40 ug/min via INTRAVENOUS
  Filled 2016-03-18 (×3): qty 250

## 2016-03-18 MED ORDER — SODIUM CHLORIDE 0.9 % IV SOLN
INTRAVENOUS | Status: DC
  Administered 2016-03-18: 12:00:00 via INTRAVENOUS

## 2016-03-18 MED ORDER — LIDOCAINE 2% (20 MG/ML) 5 ML SYRINGE
INTRAMUSCULAR | Status: AC
  Filled 2016-03-18: qty 5

## 2016-03-18 MED ORDER — MAGNESIUM SULFATE 4 GM/100ML IV SOLN
4.0000 g | Freq: Once | INTRAVENOUS | Status: AC
  Administered 2016-03-18: 4 g via INTRAVENOUS
  Filled 2016-03-18: qty 100

## 2016-03-18 MED ORDER — PROPOFOL 10 MG/ML IV BOLUS
INTRAVENOUS | Status: DC | PRN
  Administered 2016-03-18: 50 mg via INTRAVENOUS
  Administered 2016-03-18: 60 mg via INTRAVENOUS

## 2016-03-18 MED ORDER — CHLORHEXIDINE GLUCONATE 0.12 % MT SOLN
15.0000 mL | OROMUCOSAL | Status: AC
  Administered 2016-03-18: 15 mL via OROMUCOSAL

## 2016-03-18 MED ORDER — SODIUM CHLORIDE 0.9 % IV SOLN: 250.0000 mL | INTRAVENOUS | Status: DC

## 2016-03-18 MED ORDER — MORPHINE SULFATE (PF) 2 MG/ML IV SOLN
1.0000 mg | INTRAVENOUS | Status: AC | PRN
  Administered 2016-03-18 (×2): 2 mg via INTRAVENOUS
  Filled 2016-03-18: qty 2
  Filled 2016-03-18 (×2): qty 1

## 2016-03-18 MED ORDER — PROTAMINE SULFATE 10 MG/ML IV SOLN
INTRAVENOUS | Status: DC | PRN
  Administered 2016-03-18: 230 mg via INTRAVENOUS

## 2016-03-18 MED ORDER — DEXMEDETOMIDINE HCL IN NACL 200 MCG/50ML IV SOLN
0.0000 ug/kg/h | INTRAVENOUS | Status: DC
  Filled 2016-03-18: qty 50

## 2016-03-18 MED ORDER — ACETAMINOPHEN 160 MG/5ML PO SOLN: 1000.0000 mg | Freq: Four times a day (QID) | ORAL | Status: DC

## 2016-03-18 MED ORDER — PHENYLEPHRINE HCL 10 MG/ML IJ SOLN
0.0000 ug/min | INTRAVENOUS | Status: DC
  Filled 2016-03-18 (×2): qty 2

## 2016-03-18 MED ORDER — SODIUM CHLORIDE 0.9% FLUSH
3.0000 mL | INTRAVENOUS | Status: DC | PRN
Start: 2016-03-19 — End: 2016-03-23

## 2016-03-18 MED ORDER — LACTATED RINGERS IV SOLN
INTRAVENOUS | Status: DC | PRN
  Administered 2016-03-18: 08:00:00 via INTRAVENOUS

## 2016-03-18 MED ORDER — ALBUMIN HUMAN 5 % IV SOLN
INTRAVENOUS | Status: AC
  Filled 2016-03-18: qty 250

## 2016-03-18 MED ORDER — NITROGLYCERIN IN D5W 200-5 MCG/ML-% IV SOLN
INTRAVENOUS | Status: DC | PRN
  Administered 2016-03-18: 15 ug/min via INTRAVENOUS

## 2016-03-18 MED ORDER — MIDAZOLAM HCL 2 MG/2ML IJ SOLN: 2.0000 mg | INTRAMUSCULAR | Status: DC | PRN

## 2016-03-18 MED ORDER — LACTATED RINGERS IV SOLN
INTRAVENOUS | Status: DC | PRN
  Administered 2016-03-18 (×3): via INTRAVENOUS

## 2016-03-18 MED ORDER — LACTATED RINGERS IV SOLN
INTRAVENOUS | Status: DC | PRN
  Administered 2016-03-18: 07:00:00 via INTRAVENOUS

## 2016-03-18 MED ORDER — VANCOMYCIN HCL IN DEXTROSE 1-5 GM/200ML-% IV SOLN
1000.0000 mg | Freq: Once | INTRAVENOUS | Status: AC
  Administered 2016-03-18: 1000 mg via INTRAVENOUS
  Filled 2016-03-18: qty 200

## 2016-03-18 MED ORDER — ARTIFICIAL TEARS OP OINT
TOPICAL_OINTMENT | OPHTHALMIC | Status: AC
  Filled 2016-03-18: qty 3.5

## 2016-03-18 MED ORDER — PHENYLEPHRINE 40 MCG/ML (10ML) SYRINGE FOR IV PUSH (FOR BLOOD PRESSURE SUPPORT)
PREFILLED_SYRINGE | INTRAVENOUS | Status: AC
  Filled 2016-03-18: qty 20

## 2016-03-18 MED ORDER — SODIUM CHLORIDE 0.9 % IV SOLN
INTRAVENOUS | Status: DC
  Administered 2016-03-18: 1.4 [IU]/h via INTRAVENOUS
  Filled 2016-03-18 (×2): qty 2.5

## 2016-03-18 MED ORDER — PANTOPRAZOLE SODIUM 40 MG PO TBEC
40.0000 mg | DELAYED_RELEASE_TABLET | Freq: Every day | ORAL | Status: DC
  Administered 2016-03-20 – 2016-03-23 (×4): 40 mg via ORAL
  Filled 2016-03-18 (×5): qty 1

## 2016-03-18 MED ORDER — FENTANYL CITRATE (PF) 250 MCG/5ML IJ SOLN
INTRAMUSCULAR | Status: AC
  Filled 2016-03-18: qty 5

## 2016-03-18 MED ORDER — BISACODYL 10 MG RE SUPP: 10.0000 mg | Freq: Every day | RECTAL | Status: DC

## 2016-03-18 MED ORDER — HYDRALAZINE HCL 20 MG/ML IJ SOLN
10.0000 mg | Freq: Four times a day (QID) | INTRAMUSCULAR | Status: DC | PRN
  Administered 2016-03-18 – 2016-03-19 (×3): 10 mg via INTRAVENOUS
  Filled 2016-03-18 (×3): qty 1

## 2016-03-18 MED ORDER — PHENYLEPHRINE HCL 10 MG/ML IJ SOLN
INTRAMUSCULAR | Status: DC | PRN
  Administered 2016-03-18 (×2): 20 ug via INTRAVENOUS

## 2016-03-18 MED ORDER — 0.9 % SODIUM CHLORIDE (POUR BTL) OPTIME
TOPICAL | Status: DC | PRN
  Administered 2016-03-18: 6000 mL

## 2016-03-18 MED ORDER — HEPARIN SODIUM (PORCINE) 1000 UNIT/ML IJ SOLN
INTRAMUSCULAR | Status: AC
  Filled 2016-03-18: qty 1

## 2016-03-18 MED ORDER — ROCURONIUM BROMIDE 100 MG/10ML IV SOLN
INTRAVENOUS | Status: DC | PRN
  Administered 2016-03-18 (×2): 50 mg via INTRAVENOUS
  Administered 2016-03-18: 20 mg via INTRAVENOUS

## 2016-03-18 MED ORDER — SODIUM CHLORIDE 0.9 % IV SOLN
INTRAVENOUS | Status: DC | PRN
  Administered 2016-03-18: 08:00:00 via INTRAVENOUS

## 2016-03-18 MED ORDER — ASPIRIN 81 MG PO CHEW: 324.0000 mg | CHEWABLE_TABLET | Freq: Every day | ORAL | Status: DC

## 2016-03-18 MED ORDER — METOPROLOL TARTRATE 5 MG/5ML IV SOLN
2.5000 mg | INTRAVENOUS | Status: DC | PRN
  Administered 2016-03-19: 2.5 mg via INTRAVENOUS
  Administered 2016-03-21: 5 mg via INTRAVENOUS
  Filled 2016-03-18 (×2): qty 5

## 2016-03-18 MED ORDER — SODIUM CHLORIDE 0.9 % IV SOLN
INTRAVENOUS | Status: DC
  Administered 2016-03-18: 17:00:00 via INTRAVENOUS

## 2016-03-18 MED ORDER — ROCURONIUM BROMIDE 50 MG/5ML IV SOLN
INTRAVENOUS | Status: AC
  Filled 2016-03-18: qty 2

## 2016-03-18 MED ORDER — CARVEDILOL 3.125 MG PO TABS
3.1250 mg | ORAL_TABLET | Freq: Once | ORAL | Status: AC
  Administered 2016-03-18: 3.125 mg via ORAL

## 2016-03-18 MED ORDER — BISACODYL 5 MG PO TBEC
10.0000 mg | DELAYED_RELEASE_TABLET | Freq: Every day | ORAL | Status: DC
  Administered 2016-03-19 – 2016-03-22 (×4): 10 mg via ORAL
  Filled 2016-03-18 (×4): qty 2

## 2016-03-18 MED ORDER — METOPROLOL TARTRATE 12.5 MG HALF TABLET
12.5000 mg | ORAL_TABLET | Freq: Two times a day (BID) | ORAL | Status: DC
Start: 2016-03-18 — End: 2016-03-19

## 2016-03-18 MED ORDER — ACETAMINOPHEN 500 MG PO TABS
1000.0000 mg | ORAL_TABLET | Freq: Four times a day (QID) | ORAL | Status: DC
  Administered 2016-03-19 – 2016-03-23 (×11): 1000 mg via ORAL
  Filled 2016-03-18 (×12): qty 2

## 2016-03-18 MED ORDER — MORPHINE SULFATE (PF) 2 MG/ML IV SOLN
2.0000 mg | INTRAVENOUS | Status: DC | PRN
  Administered 2016-03-18 – 2016-03-20 (×9): 2 mg via INTRAVENOUS
  Filled 2016-03-18: qty 1
  Filled 2016-03-18 (×2): qty 2
  Filled 2016-03-18 (×2): qty 1

## 2016-03-18 MED ORDER — SODIUM CHLORIDE 0.45 % IV SOLN
INTRAVENOUS | Status: DC | PRN
  Administered 2016-03-18: 12:00:00 via INTRAVENOUS

## 2016-03-18 MED ORDER — LEVALBUTEROL HCL 0.63 MG/3ML IN NEBU
0.6300 mg | INHALATION_SOLUTION | Freq: Three times a day (TID) | RESPIRATORY_TRACT | Status: DC
  Administered 2016-03-18 – 2016-03-20 (×7): 0.63 mg via RESPIRATORY_TRACT
  Filled 2016-03-18 (×7): qty 3

## 2016-03-18 MED FILL — Magnesium Sulfate Inj 50%: INTRAMUSCULAR | Qty: 10 | Status: AC

## 2016-03-18 MED FILL — Heparin Sodium (Porcine) Inj 1000 Unit/ML: INTRAMUSCULAR | Qty: 30 | Status: AC

## 2016-03-18 MED FILL — Potassium Chloride Inj 2 mEq/ML: INTRAVENOUS | Qty: 40 | Status: AC

## 2016-03-18 SURGICAL SUPPLY — 117 items
ADAPTER CARDIO PERF ANTE/RETRO (ADAPTER) ×3 IMPLANT
APPLICATOR COTTON TIP 6IN STRL (MISCELLANEOUS) IMPLANT
ATTRACTOMAT 16X20 MAGNETIC DRP (DRAPES) ×3 IMPLANT
BAG DECANTER FOR FLEXI CONT (MISCELLANEOUS) ×3 IMPLANT
BLADE STERNUM SYSTEM 6 (BLADE) ×3 IMPLANT
BLADE SURG 11 STRL SS (BLADE) ×6 IMPLANT
CANISTER SUCTION 2500CC (MISCELLANEOUS) ×3 IMPLANT
CANN PRFSN 3/8X14X24FR PCFC (MISCELLANEOUS)
CANN PRFSN 3/8XCNCT ST RT ANG (MISCELLANEOUS)
CANNULA AORTIC ROOT 9FR (CANNULA) ×3 IMPLANT
CANNULA EZ GLIDE AORTIC 21FR (CANNULA) ×3 IMPLANT
CANNULA GUNDRY RCSP 15FR (MISCELLANEOUS) ×3 IMPLANT
CANNULA MC2 2 STG 36/46 NON-V (CANNULA) ×2 IMPLANT
CANNULA PRFSN 3/8X14X24FR PCFC (MISCELLANEOUS) IMPLANT
CANNULA PRFSN 3/8XCNCT RT ANG (MISCELLANEOUS) IMPLANT
CANNULA SOFTFLOW AORTIC 7M21FR (CANNULA) ×3 IMPLANT
CANNULA VEN MTL TIP RT (MISCELLANEOUS)
CANNULA VENOUS 2 STG 34/46 (CANNULA) ×1
CARDIOBLATE CARDIAC ABLATION (MISCELLANEOUS)
CATH CPB KIT OWEN (MISCELLANEOUS) ×3 IMPLANT
CATH HEART VENT LEFT (CATHETERS) ×2 IMPLANT
CATH ROBINSON RED A/P 18FR (CATHETERS) IMPLANT
CATH THORACIC 28FR RT ANG (CATHETERS) IMPLANT
CATH THORACIC 36FR (CATHETERS) ×3 IMPLANT
CATH THORACIC 36FR RT ANG (CATHETERS) IMPLANT
CLIP FOGARTY SPRING 6M (CLIP) IMPLANT
CONN 1/2X1/2X1/2  BEN (MISCELLANEOUS)
CONN 1/2X1/2X1/2 BEN (MISCELLANEOUS) IMPLANT
CONN 3/8X1/2 ST GISH (MISCELLANEOUS) ×3 IMPLANT
CONT SPEC 4OZ CLIKSEAL STRL BL (MISCELLANEOUS) ×6 IMPLANT
COVER SURGICAL LIGHT HANDLE (MISCELLANEOUS) IMPLANT
CRADLE DONUT ADULT HEAD (MISCELLANEOUS) ×3 IMPLANT
DEVICE CARDIOBLATE CARDIAC ABL (MISCELLANEOUS) IMPLANT
DEVICE SUT CK QUICK LOAD MINI (Prosthesis & Implant Heart) ×6 IMPLANT
DRAIN CHANNEL 32F RND 10.7 FF (WOUND CARE) ×3 IMPLANT
DRAPE BILATERAL SPLIT (DRAPES) IMPLANT
DRAPE CARDIOVASCULAR INCISE (DRAPES) ×1
DRAPE CV SPLIT W-CLR ANES SCRN (DRAPES) IMPLANT
DRAPE INCISE IOBAN 66X45 STRL (DRAPES) ×12 IMPLANT
DRAPE SLUSH/WARMER DISC (DRAPES) ×3 IMPLANT
DRAPE SRG 135X102X78XABS (DRAPES) ×2 IMPLANT
DRSG COVADERM 4X14 (GAUZE/BANDAGES/DRESSINGS) ×3 IMPLANT
ELECT REM PT RETURN 9FT ADLT (ELECTROSURGICAL) ×6
ELECTRODE REM PT RTRN 9FT ADLT (ELECTROSURGICAL) ×4 IMPLANT
FELT TEFLON 1X6 (MISCELLANEOUS) ×3 IMPLANT
GAUZE SPONGE 4X4 12PLY STRL (GAUZE/BANDAGES/DRESSINGS) ×3 IMPLANT
GLOVE BIO SURGEON STRL SZ 6 (GLOVE) ×9 IMPLANT
GLOVE BIO SURGEON STRL SZ 6.5 (GLOVE) ×9 IMPLANT
GLOVE BIO SURGEON STRL SZ7 (GLOVE) IMPLANT
GLOVE BIO SURGEON STRL SZ7.5 (GLOVE) IMPLANT
GLOVE ORTHO TXT STRL SZ7.5 (GLOVE) ×9 IMPLANT
GOWN STRL REUS W/ TWL LRG LVL3 (GOWN DISPOSABLE) ×16 IMPLANT
GOWN STRL REUS W/TWL LRG LVL3 (GOWN DISPOSABLE) ×8
HEMOSTAT POWDER SURGIFOAM 1G (HEMOSTASIS) ×9 IMPLANT
INSERT FOGARTY XLG (MISCELLANEOUS) ×3 IMPLANT
KIT BASIN OR (CUSTOM PROCEDURE TRAY) ×3 IMPLANT
KIT DRAINAGE VACCUM ASSIST (KITS) ×3 IMPLANT
KIT ROOM TURNOVER OR (KITS) ×3 IMPLANT
KIT SUCTION CATH 14FR (SUCTIONS) ×12 IMPLANT
KIT SUT CK MINI COMBO 4X17 (Prosthesis & Implant Heart) ×3 IMPLANT
LEAD PACING MYOCARDI (MISCELLANEOUS) ×3 IMPLANT
LINE VENT (MISCELLANEOUS) ×3 IMPLANT
MARKER GRAFT CORONARY BYPASS (MISCELLANEOUS) IMPLANT
NS IRRIG 1000ML POUR BTL (IV SOLUTION) ×18 IMPLANT
PACK OPEN HEART (CUSTOM PROCEDURE TRAY) ×3 IMPLANT
PAD ARMBOARD 7.5X6 YLW CONV (MISCELLANEOUS) ×6 IMPLANT
SET CARDIOPLEGIA MPS 5001102 (MISCELLANEOUS) ×3 IMPLANT
SET IRRIG TUBING LAPAROSCOPIC (IRRIGATION / IRRIGATOR) ×3 IMPLANT
SOLUTION ANTI FOG 6CC (MISCELLANEOUS) ×3 IMPLANT
SUCKER INTRACARDIAC WEIGHTED (SUCKER) ×3 IMPLANT
SUT BONE WAX W31G (SUTURE) ×3 IMPLANT
SUT ETHIBON 2 0 V 52N 30 (SUTURE) ×15 IMPLANT
SUT ETHIBON EXCEL 2-0 V-5 (SUTURE) IMPLANT
SUT ETHIBOND 2 0 SH (SUTURE) ×2 IMPLANT
SUT ETHIBOND 2 0 SH 36X2 (SUTURE) ×4 IMPLANT
SUT ETHIBOND 2 0 V4 (SUTURE) IMPLANT
SUT ETHIBOND 2 0V4 GREEN (SUTURE) IMPLANT
SUT ETHIBOND 4 0 RB 1 (SUTURE) IMPLANT
SUT ETHIBOND 4 0 TF (SUTURE) IMPLANT
SUT ETHIBOND 5 0 C 1 30 (SUTURE) ×3 IMPLANT
SUT ETHIBOND V-5 VALVE (SUTURE) IMPLANT
SUT ETHIBOND X763 2 0 SH 1 (SUTURE) ×18 IMPLANT
SUT MNCRL AB 3-0 PS2 18 (SUTURE) ×6 IMPLANT
SUT PDS AB 1 CTX 36 (SUTURE) ×6 IMPLANT
SUT PROLENE 3 0 SH 1 (SUTURE) ×3 IMPLANT
SUT PROLENE 3 0 SH DA (SUTURE) ×6 IMPLANT
SUT PROLENE 3 0 SH1 36 (SUTURE) ×3 IMPLANT
SUT PROLENE 4 0 RB 1 (SUTURE) ×6
SUT PROLENE 4 0 SH DA (SUTURE) ×9 IMPLANT
SUT PROLENE 4-0 RB1 .5 CRCL 36 (SUTURE) ×12 IMPLANT
SUT PROLENE 5 0 C 1 24 (SUTURE) ×3 IMPLANT
SUT PROLENE 5 0 C 1 36 (SUTURE) ×3 IMPLANT
SUT PROLENE 6 0 C 1 30 (SUTURE) IMPLANT
SUT SILK  1 MH (SUTURE) ×2
SUT SILK 1 MH (SUTURE) ×4 IMPLANT
SUT SILK 2 0 SH CR/8 (SUTURE) IMPLANT
SUT SILK 3 0 SH CR/8 (SUTURE) IMPLANT
SUT STEEL 6MS V (SUTURE) IMPLANT
SUT STEEL STERNAL CCS#1 18IN (SUTURE) IMPLANT
SUT STEEL SZ 6 DBL 3X14 BALL (SUTURE) IMPLANT
SUT VIC AB 2-0 CTX 27 (SUTURE) IMPLANT
SUTURE E-PAK OPEN HEART (SUTURE) ×3 IMPLANT
SWAB COLLECTION DEVICE MRSA (MISCELLANEOUS) ×3 IMPLANT
SWAB CULTURE ESWAB REG 1ML (MISCELLANEOUS) ×3 IMPLANT
SYSTEM SAHARA CHEST DRAIN ATS (WOUND CARE) ×3 IMPLANT
TAPE CLOTH SURG 4X10 WHT LF (GAUZE/BANDAGES/DRESSINGS) ×3 IMPLANT
TAPE PAPER 2X10 WHT MICROPORE (GAUZE/BANDAGES/DRESSINGS) ×3 IMPLANT
TOWEL OR 17X24 6PK STRL BLUE (TOWEL DISPOSABLE) ×6 IMPLANT
TOWEL OR 17X26 10 PK STRL BLUE (TOWEL DISPOSABLE) ×6 IMPLANT
TRAY FOLEY IC TEMP SENS 14FR (CATHETERS) IMPLANT
TRAY FOLEY IC TEMP SENS 16FR (CATHETERS) ×3 IMPLANT
TUBE SUCT INTRACARD DLP 20F (MISCELLANEOUS) ×3 IMPLANT
TUBING INSUFFLATION 10FT LAP (TUBING) ×3 IMPLANT
UNDERPAD 30X30 INCONTINENT (UNDERPADS AND DIAPERS) ×3 IMPLANT
VALVE MAGNA EASE AORTIC 27MM (Prosthesis & Implant Heart) ×3 IMPLANT
VENT LEFT HEART 12002 (CATHETERS) ×3
WATER STERILE IRR 1000ML POUR (IV SOLUTION) ×6 IMPLANT

## 2016-03-18 NOTE — Brief Op Note (Addendum)
03/18/2016  10:31 AM      [address]       [place] [address]             [phone]     03/18/2016  10:31 AM  PATIENT:  Alexis [name]  65 y.o. 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  Aortic Stenosis:  No.  Etiology (Choose at least one and up to  5 etiologies):  Degenerative - Leadflet prolapse with or without annular dilation  Aortic Valve  Procedure Performed:  Replacement: Yes.  Bioprosthetic Valve. Implant Model Number:[id number], Size:19mm, Unique Device Identifier:[id number]  Repair/Reconstruction: No.   Aortic Annular Enlargement: No.   COMPLICATIONS: None  BASELINE WEIGHT: 53.9 kg  PATIENT DISPOSITION:   TO SICU IN STABLE CONDITION  [name], MD 03/18/2016 11:33 AM

## 2016-03-18 NOTE — Progress Notes (Signed)
RT note: Patient alert and had a FVC of 800 and a NIIF of only-18. MD was called for possible extubation orders.

## 2016-03-18 NOTE — Anesthesia Procedure Notes (Signed)
Procedure Name: Intubation Date/Time: 03/18/2016 8:47 AM Performed by: Merdis Delay Pre-anesthesia Checklist: Patient identified, Emergency Drugs available, Suction available, Patient being monitored and Timeout performed Patient Re-evaluated:Patient Re-evaluated prior to inductionOxygen Delivery Method: Circle system utilized Preoxygenation: Pre-oxygenation with 100% oxygen Intubation Type: IV induction Ventilation: Mask ventilation without difficulty Laryngoscope Size: Miller and 2 Grade View: Grade I Tube type: Oral Tube size: 8.0 mm Number of attempts: 1 Airway Equipment and Method: Stylet Placement Confirmation: ETT inserted through vocal cords under direct vision,  positive ETCO2,  CO2 detector and breath sounds checked- equal and bilateral Secured at: 22 cm Tube secured with: Tape Dental Injury: Teeth and Oropharynx as per pre-operative assessment

## 2016-03-18 NOTE — Interval H&P Note (Signed)
History and Physical Interval Note:  03/18/2016 7:23 AM  Alexis [name]  has presented today for surgery, with the diagnosis of AI TR  The various methods of treatment have been discussed with the patient and family. After consideration of risks, benefits and other options for treatment, the patient has consented to  Procedure(s): AORTIC VALVE REPLACEMENT (AVR) (N/A) POSSIBLE TRICUSPID VALVE REPAIR (N/A) TRANSESOPHAGEAL ECHOCARDIOGRAM (TEE) (N/A) as a surgical intervention .  The patient's history has been reviewed, patient examined, no change in status, stable for surgery.  I have reviewed the patient's chart and labs.  Questions were answered to the patient's satisfaction.     [name]

## 2016-03-18 NOTE — Progress Notes (Signed)
Chaplain responded to pt's request for prayer before aortic valve replacement surgery. Pt was in room adjacent to Short Stay desk, and pt's daughter was present. Chaplain prayed with pt and daughter just before he was taken to surgery. Pt and staff both expressed appreciation for chaplain visit.

## 2016-03-18 NOTE — Transfer of Care (Addendum)
Immediate Anesthesia Transfer of Care Note  Patient: Alexis [name]  Procedure(s) Performed: Procedure(s): AORTIC VALVE REPLACEMENT (AVR) implanted magna ease 40m (N/A) TRANSESOPHAGEAL ECHOCARDIOGRAM (TEE) (N/A)  Patient Location: SICU  Anesthesia Type:General  Level of Consciousness: sedated and Patient remains intubated per anesthesia plan  Airway & Oxygen Therapy: Patient remains intubated per anesthesia plan and Patient placed on Ventilator (see vital sign flow sheet for setting)  Post-op Assessment: Report given to RN and Post -op Vital signs reviewed and stable  Post vital signs: Reviewed and stable  Last Vitals:  Filed Vitals:   03/18/16 0557  BP: 196/57  Pulse: 75  Temp: 36.8 C  Resp: 20    Last Pain:  Filed Vitals:   03/18/16 0615  PainSc: 7       Patients Stated Pain Goal: 4 [id number]  Complications: No apparent anesthesia complications   Pt VSS during transport to ICU. Pt met my ICU RN and RT. Report given to RN/RT and all questions answered. Pt connected to ICU monitors and continued stability confirmed.   BP 1638-453systolic during transport to ICU. VSS.  BP in unit 97/74. NTG off.

## 2016-03-18 NOTE — Care Management Note (Signed)
Case Management Note  Patient Details  Name: Alexis [name] MRN: [id number] Date of Birth: [birth year]  Subjective/Objective:   S/p AVR                 Action/Plan:  PTA independent from home alone - pt has adult children.  The patient is a service connected veteran and has been followed through the [place] [facility] for many years.  CM will follow up and inform [facility] that pt is [facility].  CM will continue to follow for discharge needs   Expected Discharge Date:                  Expected Discharge Plan:  Home/Self Care  In-House Referral:     Discharge planning Services  CM Consult  Post Acute Care Choice:    Choice offered to:     DME Arranged:    DME Agency:     HH Arranged:    HH Agency:     Status of Service:  In process, will continue to follow  If discussed at Long Length of Stay Meetings, dates discussed:    Additional Comments:  Maryclare Labrador, RN 03/18/2016, 10:02 PM

## 2016-03-18 NOTE — Anesthesia Postprocedure Evaluation (Signed)
Anesthesia Post Note  Patient: Izach Dowlin  Procedure(s) Performed: Procedure(s) (LRB): AORTIC VALVE REPLACEMENT (AVR) implanted magna ease 22mm (N/A) TRANSESOPHAGEAL ECHOCARDIOGRAM (TEE) (N/A)  Patient location during evaluation: SICU Anesthesia Type: General Level of consciousness: sedated Pain management: pain level controlled Vital Signs Assessment: post-procedure vital signs reviewed and stable Respiratory status: patient remains intubated per anesthesia plan Cardiovascular status: stable Anesthetic complications: no    Last Vitals:  Filed Vitals:   03/18/16 1630 03/18/16 1631  BP:    Pulse: 68 68  Temp: 36.2 C 36.2 C  Resp: 15 14    Last Pain:  Filed Vitals:   03/18/16 1642  PainSc: 7                  Inaya Gillham DAVID

## 2016-03-18 NOTE — Anesthesia Preprocedure Evaluation (Signed)
Anesthesia Evaluation  Patient identified by MRN, date of birth, ID band Patient awake    Reviewed: Allergy & Precautions, NPO status , Patient's Chart, lab work & pertinent test results  Airway Mallampati: I  TM Distance: >3 FB Neck ROM: Full    Dental   Pulmonary shortness of breath, former smoker,    Pulmonary exam normal        Cardiovascular hypertension, Pt. on medications Normal cardiovascular exam+ Valvular Problems/Murmurs AI      Neuro/Psych Depression CVA    GI/Hepatic GERD  Controlled,(+) Hepatitis -, C  Endo/Other    Renal/GU      Musculoskeletal   Abdominal   Peds  Hematology   Anesthesia Other Findings   Reproductive/Obstetrics                             Anesthesia Physical Anesthesia Plan  ASA: III  Anesthesia Plan: General   Post-op Pain Management:    Induction: Intravenous  Airway Management Planned: Oral ETT  Additional Equipment: Arterial line, CVP, PA Cath, TEE and Ultrasound Guidance Line Placement  Intra-op Plan:   Post-operative Plan: Post-operative intubation/ventilation  Informed Consent: I have reviewed the patients History and Physical, chart, labs and discussed the procedure including the risks, benefits and alternatives for the proposed anesthesia with the patient or authorized representative who has indicated his/her understanding and acceptance.     Plan Discussed with: CRNA and Surgeon  Anesthesia Plan Comments:         Anesthesia Quick Evaluation

## 2016-03-18 NOTE — Procedures (Signed)
Extubation Procedure Note  Patient Details:   Name: Alexis [name] DOB: 1951-07-05 MRN: [id number]   Airway Documentation:  Airway 8 mm (Active)  Secured at (cm) 23 cm 03/18/2016  8:00 PM  Measured From Lips 03/18/2016  8:00 PM  Secured Location Right 03/18/2016  8:00 PM  Secured By Pink Tape 03/18/2016  8:00 PM  Site Condition Dry 03/18/2016  8:00 PM    Evaluation  O2 sats: stable throughout Complications: No apparent complications Patient did tolerate procedure well. Bilateral Breath Sounds: Clear, Diminished   Yes  Jori Moll 03/18/2016, 8:28 PM   Patient performed a NIF of -24 and FVC of 1.3L Cuff leak was present and patient extubated to 4L nasal cannula.

## 2016-03-18 NOTE — Progress Notes (Signed)
  Echocardiogram Echocardiogram Transesophageal has been performed.  Bobbye Charleston 03/18/2016, 8:59 AM

## 2016-03-18 NOTE — Progress Notes (Signed)
      [address]       [place] [address]             [phone]      S/p AVR  Extubated  BP 120/88 mmHg  Pulse 60  Temp(Src) 98.4 F (36.9 C) (Core (Comment))  Resp 16  Ht 5\' 4"  (1.626 m)  Wt 119 lb (53.978 kg)  BMI 20.42 kg/m2  SpO2 100%  15/4, CI= 2.4   Intake/Output Summary (Last 24 hours) at 03/18/16 2237 Last data filed at 03/18/16 2200  Gross per 24 hour  Intake 5786.09 ml  Output   7275 ml  Net -1488.91 ml    On 100 mcg/min of NTG, will add PRN hydralazine  Remo Lipps C. Roxan Hockey, MD [facility] [phone]

## 2016-03-18 NOTE — Op Note (Addendum)
CARDIOTHORACIC SURGERY OPERATIVE NOTE  Date of Procedure:  03/18/2016  Preoperative Diagnosis: Severe Aortic Insufficiency  Postoperative Diagnosis: Same   Procedure:    Aortic Valve Replacement  Edwards Magna Ease Pericardial Tissue Valve (size 27 mm, model # 3300TFX, serial # [id number])   Surgeon: Valentina Gu. 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 The patient is a service connected veteran and has been followed through the [place] medical system for many years. He is not certain about any previous diagnosis of heart valve disease, but he thinks that his primary care physician may have told him that he had a heart murmur in the past. He had never been formally evaluated by a cardiologist until recently. The patient presented acutely for hospital admission on 10/15/2015 with severe resting shortness of breath, cough, chest pain, headache, nausea, vomiting, and diarrhea. He described a 3 month history of anorexia and associated 35 pounds weight loss. He reports history of low-grade fevers and night sweats. Part to his hospital admission he had been treated as an outpatient with oral antibiotics for a presumed urinary tract infection. The patient was diagnosed with acute hypoxic  respiratory failure secondary to acute congestive heart failure with possible bronchitis. BNP level was elevated 2457. CT angiogram of the chest reveals an artifact in the left lower lobe pulmonary artery that was felt to be an artifact and not consistent with acute pulmonary embolus. There was cardiac enlargement and pulmonary edema. Transthoracic echocardiogram revealed severe aortic insufficiency with moderate global left ventricular systolic dysfunction, ejection fraction estimated 40-45%, severe pulmonary hypertension and moderate tricuspid regurgitation. Cardiology was not consulted. The patient's shortness of breath improved with medical therapy including antibiotics, bronchodilators, lisinopril, and Lasix. MRI of the brain was performed at the time of the patient's initial presentation in notable for a small acute infarct in the right frontal lobe. The patient had reportedly experienced a transient episode of leg weakness that had resolved. The patient also states he had some difficulty with speech. The patient was evaluated by neurology and a thorough search for possible source of embolization was recommended. CT angiogram of the head and neck was unrevealing. A transesophageal echocardiogram was not performed. Blood cultures were not obtained. The patient was discharged from the hospital on oral azithromycin for presumed bronchitis.  The patient was readmitted to the hospital 02/05/2016 with severe exertional shortness of breath, orthopnea, and PND. BNP level was greater than 4500. Follow-up transthoracic echocardiogram revealed severe aortic insufficiency. There was at least moderate left ventricular systolic dysfunction with ejection fraction estimated 35-40%. The patient was evaluated by cardiology and treated with intravenous Lasix with good response. He underwent left and right heart catheterization 02/09/2016 revealing essentially normal coronary arteries with 90% stenosis of a very  small-caliber third obtuse marginal branch of the left circumflex and otherwise minimal nonobstructive disease. There was moderate  to severe left ventricular systolic dysfunction with severe aortic insufficiency. There was mild pulmonary hypertension. The patient developed right groin pseudoaneurysm following that required ultrasound-guided thrombosis. Transesophageal echocardiogram was performed prior to hospital discharge and confirmed the presence of severe aortic insufficiency. No obvious vegetations were seen. The patient diuresed more than 20 pounds of fluid and experienced significant clinical improvement. He was discharged from the hospital with plans for elective cardiothoracic surgical consultation as an outpatient.  The patient has been seen in consultation and counseled at length regarding the indications, risks and potential benefits of surgery.  All questions have been answered, and the patient provides full informed consent for the operation as described.    DETAILS OF THE OPERATIVE PROCEDURE  Preparation:  The patient is brought to the operating room on the above mentioned date and central monitoring was established by the anesthesia team including placement of Swan-Ganz catheter and radial arterial line. The patient is placed in the supine position on the operating table.  Intravenous antibiotics are administered. General endotracheal anesthesia is induced uneventfully. A Foley catheter is placed.  Baseline transesophageal echocardiogram was performed.  Findings were notable for severe aortic insufficiency.  The etiology was unclear as to whether or not there was a perforation of the left coronary cusp of the aortic valve or simply prolapse.  There was moderate global LV systolic dysfunction with mild LV chamber enlargement and mild LVH.  There was only mild (1+) tricuspid regurgitation.  The patient's chest, abdomen, both groins, and both lower extremities are prepared and draped in a  sterile manner. A time out procedure is performed.   Surgical Approach:  A median sternotomy incision was performed and the pericardium is opened. The ascending aorta is elongated but otherwise normal in appearance.    Extracorporeal Cardiopulmonary Bypass and Myocardial Protection:  The ascending aorta and the right atrium are cannulated for cardiopulmonary bypass.  Adequate heparinization is verified.   A retrograde cardioplegia cannula is placed through the right atrium into the coronary sinus.  The operative field was continuously flooded with carbon dioxide gas.  The entire pre-bypass portion of the operation was notable for stable hemodynamics.  Cardiopulmonary bypass was begun and the surface of the heart is inspected.  A cardioplegia cannula is placed in the ascending aorta.  A temperature probe was placed in the interventricular septum.  The patient is cooled to 32C systemic temperature.  The aortic cross clamp is applied and cold blood cardioplegia is delivered initially in an antegrade fashion through the aortic root.   Supplemental cardioplegia is given retrograde through the coronary sinus catheter.  Iced saline slush is applied for topical hypothermia.  The initial cardioplegic arrest is rapid with early diastolic arrest.  Repeat doses of cardioplegia are administered intermittently throughout the entire cross clamp portion of the operation through the coronary sinus catheter in order to maintain completely flat electrocardiogram and septal myocardial temperature below 15C.  Myocardial protection was felt to be excellent.   Aortic Valve Replacement:  An oblique transverse aortotomy incision was performed.  The aortic valve was inspected and notable for severe prolapse of the left coronary leaflet of the aortic valve.  The remaining 2 leaflets appeared normal.  There were no perforations, vegetations, nor other findings to suggest a history of endocarditis.  Cultures of the valve  were obtained.  The aortic valve leaflets were excised sharply.  There was no need to decalcify the aortic annulus.  The aortic annulus was sized to accept a 27 mm  prosthesis.  The aortic root and left ventricle were irrigated with copious cold saline solution.  Aortic valve replacement was performed using interrupted horizontal mattress 2-0 Ethibond pledgeted sutures with pledgets in the subannular position.  An [facility] Ease pericardial tissue valve (size 27 mm, model # 3300TFX, serial # [id number]) was implanted uneventfully. The valve seated appropriately with adequate space beneath the left main and right coronary artery.   Procedure Completion:  The aortotomy was closed using a 2-layer closure of running 4-0 Prolene suture.  One final dose of warm retrograde "hot shot" cardioplegia was administered retrograde through the coronary sinus catheter while all air was evacuated through the aortic root.  The aortic cross clamp was removed after a total cross clamp time of 75 minutes.  Epicardial pacing wires are fixed to the right ventricular outflow tract and to the right atrial appendage. The patient is rewarmed to 37C temperature. The aortic and left ventricular vents are removed.  The patient is weaned and disconnected from cardiopulmonary bypass.  The patient's rhythm at separation from bypass was sinus.  The patient was weaned from cardioplegic bypass without any inotropic support. Total cardiopulmonary bypass time for the operation was 99 minutes.  Followup transesophageal echocardiogram performed after separation from bypass revealed a well-seated aortic valve prosthesis that was functioning normally and without any sign of perivalvular leak.  Left ventricular function was unchanged from preoperatively.  The aortic and venous cannula were removed uneventfully. Protamine was administered to reverse the anticoagulation. The mediastinum and pleural space were inspected for hemostasis and  irrigated with saline solution. The mediastinum and right pleural space were drained using 3 chest tubes placed through separate stab incisions inferiorly.  The soft tissues anterior to the aorta were reapproximated loosely. The sternum is closed with double strength sternal wire. The soft tissues anterior to the sternum were closed in multiple layers and the skin is closed with a running subcuticular skin closure.  The post-bypass portion of the operation was notable for stable rhythm and hemodynamics.  No blood products were administered during the operation.   Disposition:  The patient tolerated the procedure well and is transported to the surgical intensive care in stable condition. There are no intraoperative complications. All sponge instrument and needle counts are verified correct at completion of the operation.    Valentina Gu. [name] MD 03/18/2016 11:39 AM

## 2016-03-18 NOTE — Progress Notes (Signed)
Rapid Wean Protocol began, RN and RT at bedside monitoring.

## 2016-03-19 ENCOUNTER — Encounter (HOSPITAL_COMMUNITY): Payer: Self-pay | Admitting: Thoracic Surgery (Cardiothoracic Vascular Surgery)

## 2016-03-19 ENCOUNTER — Inpatient Hospital Stay (HOSPITAL_COMMUNITY): Payer: Medicare HMO

## 2016-03-19 DIAGNOSIS — Z954 Presence of other heart-valve replacement: Secondary | ICD-10-CM

## 2016-03-19 DIAGNOSIS — I10 Essential (primary) hypertension: Secondary | ICD-10-CM

## 2016-03-19 DIAGNOSIS — R748 Abnormal levels of other serum enzymes: Secondary | ICD-10-CM

## 2016-03-19 DIAGNOSIS — I351 Nonrheumatic aortic (valve) insufficiency: Secondary | ICD-10-CM

## 2016-03-19 DIAGNOSIS — R072 Precordial pain: Secondary | ICD-10-CM

## 2016-03-19 LAB — CREATININE, SERUM
Creatinine, Ser: 0.82 mg/dL (ref 0.61–1.24)
GFR calc non Af Amer: >60 mL/min (ref >60)

## 2016-03-19 LAB — GLUCOSE, CAPILLARY
GLUCOSE-CAPILLARY: 106 mg/dL — AB (ref 65–99)
GLUCOSE-CAPILLARY: 107 mg/dL — AB (ref 65–99)
GLUCOSE-CAPILLARY: 115 mg/dL — AB (ref 65–99)
GLUCOSE-CAPILLARY: 122 mg/dL — AB (ref 65–99)
GLUCOSE-CAPILLARY: 128 mg/dL — AB (ref 65–99)
GLUCOSE-CAPILLARY: 129 mg/dL — AB (ref 65–99)
GLUCOSE-CAPILLARY: 132 mg/dL — AB (ref 65–99)
GLUCOSE-CAPILLARY: 141 mg/dL — AB (ref 65–99)
GLUCOSE-CAPILLARY: 92 mg/dL (ref 65–99)
Glucose-Capillary: 102 mg/dL — ABNORMAL HIGH (ref 65–99)
Glucose-Capillary: 105 mg/dL — ABNORMAL HIGH (ref 65–99)
Glucose-Capillary: 110 mg/dL — ABNORMAL HIGH (ref 65–99)
Glucose-Capillary: 115 mg/dL — ABNORMAL HIGH (ref 65–99)
Glucose-Capillary: 125 mg/dL — ABNORMAL HIGH (ref 65–99)
Glucose-Capillary: 144 mg/dL — ABNORMAL HIGH (ref 65–99)
Glucose-Capillary: 150 mg/dL — ABNORMAL HIGH (ref 65–99)
Glucose-Capillary: 151 mg/dL — ABNORMAL HIGH (ref 65–99)
Glucose-Capillary: 75 mg/dL (ref 65–99)
Glucose-Capillary: 115 mg/dL — ABNORMAL HIGH (ref 65–99)
Glucose-Capillary: 100 mg/dL — ABNORMAL HIGH (ref 65–99)
Glucose-Capillary: 115 mg/dL — ABNORMAL HIGH (ref 65–99)
Glucose-Capillary: 94 mg/dL (ref 65–99)

## 2016-03-19 LAB — BASIC METABOLIC PANEL
ANION GAP: 5 (ref 5–15)
BUN: 6 mg/dL (ref 6–20)
CALCIUM: 7.7 mg/dL — AB (ref 8.9–10.3)
CO2: 23 mmol/L (ref 22–32)
CREATININE: 0.6 mg/dL — AB (ref 0.61–1.24)
Chloride: 105 mmol/L (ref 101–111)
GLUCOSE: 88 mg/dL (ref 65–99)
Potassium: 4.2 mmol/L (ref 3.5–5.1)
Sodium: 133 mmol/L — ABNORMAL LOW (ref 135–145)

## 2016-03-19 LAB — CBC
HCT: 27.5 % — ABNORMAL LOW (ref 39.0–52.0)
HEMATOCRIT: 24.5 % — AB (ref 39.0–52.0)
HEMOGLOBIN: 9 g/dL — AB (ref 13.0–17.0)
Hemoglobin: 8.1 g/dL — ABNORMAL LOW (ref 13.0–17.0)
MCH: 29.9 pg (ref 26.0–34.0)
MCH: 30 pg (ref 26.0–34.0)
MCHC: 32.7 g/dL (ref 30.0–36.0)
MCHC: 33.1 g/dL (ref 30.0–36.0)
MCV: 91.4 fL (ref 78.0–100.0)
MCV: 90.7 fL (ref 78.0–100.0)
PLATELETS: 173 10*3/uL (ref 150–400)
PLATELETS: 134 10*3/uL — AB (ref 150–400)
RBC: 3.01 MIL/uL — AB (ref 4.22–5.81)
RBC: 2.7 MIL/uL — ABNORMAL LOW (ref 4.22–5.81)
RDW: 14.9 % (ref 11.5–15.5)
RDW: 14.9 % (ref 11.5–15.5)
WBC: 14.3 10*3/uL — ABNORMAL HIGH (ref 4.0–10.5)
WBC: 12.9 10*3/uL — AB (ref 4.0–10.5)

## 2016-03-19 LAB — CK TOTAL AND CKMB (NOT AT ARMC)
CK TOTAL: 297 U/L (ref 49–397)
CK, MB: 26.2 ng/mL — ABNORMAL HIGH (ref 0.5–5.0)
CK, MB: 21.5 ng/mL — AB (ref 0.5–5.0)
RELATIVE INDEX: 7.2 — AB (ref 0.0–2.5)
RELATIVE INDEX: 9.6 — AB (ref 0.0–2.5)
Total CK: 273 U/L (ref 49–397)

## 2016-03-19 LAB — POCT I-STAT, CHEM 8
BUN: 9 mg/dL (ref 6–20)
CREATININE: 0.7 mg/dL (ref 0.61–1.24)
Calcium, Ion: 1.14 mmol/L (ref 1.12–1.23)
Chloride: 99 mmol/L — ABNORMAL LOW (ref 101–111)
Glucose, Bld: 121 mg/dL — ABNORMAL HIGH (ref 65–99)
HEMATOCRIT: 27 % — AB (ref 39.0–52.0)
Hemoglobin: 9.2 g/dL — ABNORMAL LOW (ref 13.0–17.0)
POTASSIUM: 4 mmol/L (ref 3.5–5.1)
Sodium: 136 mmol/L (ref 135–145)
TCO2: 25 mmol/L (ref 0–100)

## 2016-03-19 LAB — TROPONIN I
TROPONIN I: 4.22 ng/mL — AB (ref <0.03)
TROPONIN I: 4.23 ng/mL — AB (ref <0.03)

## 2016-03-19 LAB — POCT I-STAT 4, (NA,K, GLUC, HGB,HCT)
GLUCOSE: 112 mg/dL — AB (ref 65–99)
HEMATOCRIT: 28 % — AB (ref 39.0–52.0)
HEMOGLOBIN: 9.5 g/dL — AB (ref 13.0–17.0)
Potassium: 3.7 mmol/L (ref 3.5–5.1)
Sodium: 140 mmol/L (ref 135–145)

## 2016-03-19 LAB — MAGNESIUM
Magnesium: 2.1 mg/dL (ref 1.7–2.4)
Magnesium: 2.2 mg/dL (ref 1.7–2.4)

## 2016-03-19 MED ORDER — LABETALOL HCL 5 MG/ML IV SOLN
10.0000 mg | INTRAVENOUS | Status: DC | PRN
  Administered 2016-03-19 – 2016-03-22 (×7): 10 mg via INTRAVENOUS
  Filled 2016-03-19 (×7): qty 4

## 2016-03-19 MED ORDER — POTASSIUM CHLORIDE 10 MEQ/50ML IV SOLN
10.0000 meq | INTRAVENOUS | Status: AC
  Administered 2016-03-19 (×3): 10 meq via INTRAVENOUS

## 2016-03-19 MED ORDER — COLCHICINE 0.6 MG PO TABS: 0.6000 mg | ORAL_TABLET | Freq: Every day | ORAL | Status: DC

## 2016-03-19 MED ORDER — CARVEDILOL 6.25 MG PO TABS
6.2500 mg | ORAL_TABLET | Freq: Two times a day (BID) | ORAL | Status: DC
  Administered 2016-03-19 – 2016-03-23 (×8): 6.25 mg via ORAL
  Filled 2016-03-19 (×8): qty 1

## 2016-03-19 MED ORDER — CARVEDILOL 3.125 MG PO TABS
3.1250 mg | ORAL_TABLET | Freq: Two times a day (BID) | ORAL | Status: DC
  Administered 2016-03-19: 3.125 mg via ORAL
  Filled 2016-03-19: qty 1

## 2016-03-19 MED ORDER — ASPIRIN EC 325 MG PO TBEC
325.0000 mg | DELAYED_RELEASE_TABLET | Freq: Every day | ORAL | Status: DC
  Administered 2016-03-19 – 2016-03-23 (×5): 325 mg via ORAL
  Filled 2016-03-19 (×5): qty 1

## 2016-03-19 MED ORDER — COLCHICINE 0.6 MG PO TABS
1.2000 mg | ORAL_TABLET | Freq: Once | ORAL | Status: AC
  Administered 2016-03-19: 1.2 mg via ORAL
  Filled 2016-03-19: qty 2

## 2016-03-19 MED ORDER — INSULIN ASPART 100 UNIT/ML ~~LOC~~ SOLN
0.0000 [IU] | SUBCUTANEOUS | Status: DC
  Administered 2016-03-19 – 2016-03-20 (×5): 2 [IU] via SUBCUTANEOUS

## 2016-03-19 MED ORDER — FUROSEMIDE 10 MG/ML IJ SOLN
20.0000 mg | Freq: Once | INTRAMUSCULAR | Status: AC
  Administered 2016-03-19: 20 mg via INTRAVENOUS
  Filled 2016-03-19: qty 2

## 2016-03-19 MED ORDER — ATORVASTATIN CALCIUM 10 MG PO TABS
10.0000 mg | ORAL_TABLET | Freq: Every day | ORAL | Status: DC
  Administered 2016-03-19 – 2016-03-22 (×4): 10 mg via ORAL
  Filled 2016-03-19 (×5): qty 1

## 2016-03-19 MED ORDER — BOOST / RESOURCE BREEZE PO LIQD
1.0000 | Freq: Three times a day (TID) | ORAL | Status: DC
  Administered 2016-03-19 – 2016-03-23 (×11): 1 via ORAL

## 2016-03-19 MED ORDER — KETOROLAC TROMETHAMINE 15 MG/ML IJ SOLN
15.0000 mg | Freq: Four times a day (QID) | INTRAMUSCULAR | Status: AC
  Administered 2016-03-19 – 2016-03-20 (×5): 15 mg via INTRAVENOUS
  Filled 2016-03-19 (×5): qty 1

## 2016-03-19 MED ORDER — ENSURE ENLIVE PO LIQD
237.0000 mL | Freq: Three times a day (TID) | ORAL | Status: DC
  Administered 2016-03-20 – 2016-03-23 (×10): 237 mL via ORAL

## 2016-03-19 MED FILL — Sodium Chloride IV Soln 0.9%: INTRAVENOUS | Qty: 2000 | Status: AC

## 2016-03-19 MED FILL — Heparin Sodium (Porcine) Inj 1000 Unit/ML: INTRAMUSCULAR | Qty: 10 | Status: AC

## 2016-03-19 MED FILL — Electrolyte-R (PH 7.4) Solution: INTRAVENOUS | Qty: 5000 | Status: AC

## 2016-03-19 MED FILL — Lidocaine HCl IV Inj 20 MG/ML: INTRAVENOUS | Qty: 5 | Status: AC

## 2016-03-19 MED FILL — Sodium Bicarbonate IV Soln 8.4%: INTRAVENOUS | Qty: 50 | Status: AC

## 2016-03-19 MED FILL — Mannitol IV Soln 20%: INTRAVENOUS | Qty: 500 | Status: AC

## 2016-03-19 MED FILL — Heparin Sodium (Porcine) Inj 1000 Unit/ML: INTRAMUSCULAR | Qty: 2500 | Status: AC

## 2016-03-19 NOTE — Progress Notes (Addendum)
Initial Nutrition Assessment  DOCUMENTATION CODES:   Severe malnutrition in context of chronic illness  INTERVENTION:   Boost Breeze po TID, each supplement provides 250 kcal and 9 grams of protein  Once diet advanced >> Ensure Enlive po BID, each supplement provides 350 kcal and 20 grams of protein  NUTRITION DIAGNOSIS:   Increased nutrient needs related to  (post op healing) as evidenced by estimated needs  GOAL:   Patient will meet greater than or equal to 90% of their needs  MONITOR:   PO intake, Supplement acceptance, Labs, Weight trends, I & O's  REASON FOR ASSESSMENT:   Malnutrition Screening Tool  ASSESSMENT:   [name] with history of HTN, hepatitis C, seizure disorder, and recent stroke who was referred for surgical consultation to discuss treatment options for management of severe symptomatic aortic insufficiency with acute combined systolic and diastolic congestive heart failure.  Patient s/p procedure 7/13: AORTIC VALVE REPLACEMENT (AVR)  Patient reports his appetite is doing OK >> having some pain post-op. He states he's lost a severe amount of weight (50 lbs) since January 2017. He likes Ensure Enlive supplements >> want to gain his weight back. He is amenable to receiving Boost Breeze as he's on Clear Liquids at this time. Nutrition-Focused physical exam completed. Findings are severe fat depletion, severe muscle depletion, and no edema.   Diet Order:  Diet clear liquid Room service appropriate?: Yes; Fluid consistency:: Thin  Skin:  Reviewed, no issues  Last BM:  N/A  Height:   Ht Readings from Last 1 Encounters:  03/18/16 5\' 4"  (1.626 m)    Weight:   Wt Readings from Last 1 Encounters:  03/19/16 123 lb 3.8 oz (55.9 kg)    Ideal Body Weight:  54.5 kg  BMI:  Body mass index is 21.14 kg/(m^2).  Estimated Nutritional Needs:   Kcal:  1700-1900  Protein:  80-90 gm  Fluid:  1.7-1.9 L  EDUCATION NEEDS:   No education needs  identified at this time  Alexis [name], RD, LDN Pager #: [phone] After-Hours Pager #: [phone]

## 2016-03-19 NOTE — Progress Notes (Signed)
[address]       [place] [address]             [phone]        CARDIOTHORACIC SURGERY PROGRESS NOTE   R1 Day Post-Op Procedure(s) (LRB): AORTIC VALVE REPLACEMENT (AVR) implanted magna ease 90mm (N/A) TRANSESOPHAGEAL ECHOCARDIOGRAM (TEE) (N/A)  Subjective: Reports pain across chest made worse by movement, breathing and coughing.  No SOB  Objective: Vital signs: BP Readings from Last 1 Encounters:  03/19/16 123/82   Pulse Readings from Last 1 Encounters:  03/19/16 74   Resp Readings from Last 1 Encounters:  03/19/16 25   Temp Readings from Last 1 Encounters:  03/19/16 99.7 F (37.6 C) Core (Comment)    Hemodynamics: PAP: (12-34)/(1-14) 20/5 mmHg CO:  [2.2 L/min-5.2 L/min] 5.2 L/min CI:  [1.4 L/min/m2-3.3 L/min/m2] 3.3 L/min/m2  Physical Exam:  Rhythm:   sinus  Breath sounds: clear  Heart sounds:  RRR  Incisions:  Dressing dry, intact  Abdomen:  Soft, non-distended, non-tender  Extremities:  Warm, well-perfused  Chest tubes:  Decreasing volume thin serosanguinous output, no air leak    Intake/Output from previous day: 07/13 0701 - 07/14 0700 In: 6578.5 [I.V.:4783.5; Blood:315; NG/GT:30; IV Piggyback:1450] Out: [id number]; Blood:800; Chest Tube:580] Intake/Output this shift:    Lab Results:  CBC: Recent Labs  03/18/16 1849 03/19/16 0425  WBC 12.9* 14.3*  HGB 9.3* 9.0*  HCT 28.3* 27.5*  PLT 172 173    BMET:  Recent Labs  03/16/16 1419  03/18/16 1836 03/18/16 1849 03/19/16 0425  NA 141  < > 141  --  133*  K 3.6  < > 4.3  --  4.2  CL 107  < > 105  --  105  CO2 24  --   --   --  23  GLUCOSE 90  < > 103*  --  88  BUN 9  < > 7  --  6  CREATININE 0.71  < > 0.60* 0.64 0.60*  CALCIUM 9.2  --   --   --  7.7*  < > = values in this interval not displayed.   PT/INR:   Recent Labs  03/18/16 1200  LABPROT 17.4*  INR 1.42    CBG (last 3)   Recent Labs  03/18/16 2305 03/18/16 2359 03/19/16 0059  GLUCAP  132* 122* 115*    ABG    Component Value Date/Time   PHART 7.399 03/18/2016 2135   PCO2ART 37.6 03/18/2016 2135   PO2ART 108.0* 03/18/2016 2135   HCO3 23.2 03/18/2016 2135   TCO2 24 03/18/2016 2135   ACIDBASEDEF 1.0 03/18/2016 2135   O2SAT 98.0 03/18/2016 2135    CXR: Clear w/ mild bibasilar atelectasis  EKG: NSR w/out acute ischemic changes - mild ST elevation across precordial leads c/w post-surgical changes and NOT suggestive of ischemia   Assessment/Plan: S/P Procedure(s) (LRB): AORTIC VALVE REPLACEMENT (AVR) implanted magna ease 25mm (N/A) TRANSESOPHAGEAL ECHOCARDIOGRAM (TEE) (N/A)  Doing well POD1 Maintaining NSR w/ stable BP on NTG for hypertension Breathing comfortably w/ O2 sats 100% on 2 L/min via [place] Expected post op chest-wall pain - clearly musculoskeletal NO EVIDENSE FOR PERICARDITIS OR MYOCARDIAL ISCHEMIA Chronic combined systolic and diastolic CHF with expected post-op volume excess, mild, diuresing spontaneously Expected post op acute blood loss anemia, Hgb stable 9.0 Expected post op atelectasis, mild Hypertension   Mobilize  D/C lines  D/C tubes later today or tomorrow depending on output  Add toradol  for pain control  There is no reason to give colchicine   [name], MD 03/19/2016 7:44 AM

## 2016-03-19 NOTE — Consult Note (Signed)
Cardiology Consult    Patient ID: Alexis [name] MRN: [id number], DOB/AGE: [birth year]   Admit date: 03/18/2016 Date of Consult: 03/19/2016  Primary Physician: [name], MD Primary Cardiologist: --- Requesting Provider: Roxan Hockey  Patient Profile    CP post AVR  Past Medical History   Past Medical History  Diagnosis Date  . Hepatitis C   . Hypertension   . Headache   . Chronic systolic CHF (congestive heart failure) ([place])   . Severe aortic regurgitation   . Mitral regurgitation   . Tricuspid regurgitation   . 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 Protein calorie malnutrition ([place])   . Nephrolithiasis   . Seizure ([place])   . Shortness of breath dyspnea     LYING FLAT   . Depression   . GERD (gastroesophageal reflux disease)   . S/P aortic valve replacement with bioprosthetic valve 03/18/2016    27 mm [facility] Ease bovine pericardial tissue valve    Past Surgical History  Procedure Laterality Date  . Rotator cuff repair    . Cardiac catheterization N/A 02/09/2016    Procedure: Right/Left Heart Cath and Coronary Angiography;  Surgeon: [name], MD;  Location: [place] [facility];  Service: Cardiovascular;  Laterality: N/A;  . Tee without cardioversion N/A 02/11/2016    Procedure: TRANSESOPHAGEAL ECHOCARDIOGRAM (TEE);  Surgeon: [name], MD;  Location: [facility] ENDOSCOPY;  Service: Cardiovascular;  Laterality: N/A;  . Fracture surgery      RT ARM  . Legs      BIL FX   . Lithotripsy    . Multiple extractions with alveoloplasty N/A 03/01/2016    Procedure: Extraction of tooth #'s 7,6,81,15,72,62,03 with alveoloplasty and gross debridement of remaining teeth.;  Surgeon: [name], DDS;  Location: [facility];  Service: Oral Surgery;  Laterality: N/A;     Allergies  Allergies  Allergen Reactions  . No Known Allergies     History of Present Illness    I was asked to see Mr Alexis [name]. He is 64 y o has a PMH of severe AI, syst HF, hep C. He is now s/p AVR He  returned to his room after noon  Yesterday. He was extubated at 8 pm. He noted some pain from his sternotomy and was getting pain meds. At midnight then he developed acute retrost CP. Worse with inspiration and pressure on his sternal bone. Not alleviated with certain position but worse with laying flat. Prefers somewhat upright position. He was started on nitro and an ECG showed progressive ST elevation across precordium compared to ECG post OP. Also with T wave changes in inferior leads. His immediate post op ECG showed already similar changes compared to the preop ECG.   His preop LHC showed only a small OM branch with >90% lesion. That was in June 2017.   Inpatient Medications    . acetaminophen  1,000 mg Oral Q6H   Or  . acetaminophen (TYLENOL) oral liquid 160 mg/5 mL  1,000 mg Per Tube Q6H  . antiseptic oral rinse  7 mL Mouth Rinse QID  . aspirin EC  325 mg Oral Daily   Or  . aspirin  324 mg Per Tube Daily  . bisacodyl  10 mg Oral Daily   Or  . bisacodyl  10 mg Rectal Daily  . cefUROXime (ZINACEF)  IV  1.5 g Intravenous Q12H  . chlorhexidine gluconate (SAGE KIT)  15 mL Mouth Rinse BID  . docusate sodium  200 mg Oral  Daily  . famotidine (PEPCID) IV  20 mg Intravenous Q12H  . insulin regular  0-10 Units Intravenous TID WC  . levalbuterol  0.63 mg Nebulization TID  . metoprolol tartrate  12.5 mg Oral BID   Or  . metoprolol tartrate  12.5 mg Per Tube BID  . [START ON 03/20/2016] pantoprazole  40 mg Oral Daily  . potassium chloride  10 mEq Intravenous Q1 Hr x 3  . sodium chloride flush  3 mL Intravenous Q12H    Family History    Family History  Problem Relation Age of Onset  . Breast cancer Mother   . Aneurysm Mother     Brain aneurysm  . Hypertension Father   . Cancer Father     Pancreatic cancer  . Hypertension Brother     Social History    Social History   Social History  . Marital Status: Married    Spouse Name: N/A  . Number of Children: 3  . Years of  Education: N/A   Occupational History  . Retired     [employer]   Social History Main Topics  . Smoking status: Former Smoker -- 0.50 packs/day for 6 years    Types: Cigarettes    Quit date: 09/07/1971  . Smokeless tobacco: Former Systems developer    Types: Chew  . Alcohol Use: 0.0 oz/week    0 Standard drinks or equivalent per week     Comment: occl  . Drug Use: Yes    Special: Marijuana     Comment: THC      LAST USED WAS LAST MONTH    . Sexual Activity: Not on file   Other Topics Concern  . Not on file   Social History Narrative     Review of Systems    General:  No chills, fever, night sweats or weight changes.  Cardiovascular:  No chest pain, dyspnea on exertion, edema, orthopnea, palpitations, paroxysmal nocturnal dyspnea. Dermatological: No rash, lesions/masses Respiratory: No cough, dyspnea Urologic: No hematuria, dysuria Abdominal:   No nausea, vomiting, diarrhea, bright red blood per rectum, melena, or hematemesis Neurologic:  No visual changes, wkns, changes in mental status. All other systems reviewed and are otherwise negative except as noted above.  Physical Exam    Blood pressure 119/76, pulse 62, temperature 98.8 F (37.1 C), temperature source Core (Comment), resp. rate 22, height 5' 4"  (1.626 m), weight 53.978 kg (119 lb), SpO2 100 %.  General: Pleasant, NAD Psych: Normal affect. Neuro: Alert and oriented X 3. Moves all extremities spontaneously. HEENT: Normal  Neck: Supple without bruits or JVD. Lungs:  Resp regular and unlabored, CTA. Heart: RRR no s3, s4, or murmurs. Abdomen: Soft, non-tender, non-distended, BS + x 4.  Extremities: No clubbing, cyanosis or edema. DP/PT/Radials 2+ and equal bilaterally.  Labs    Troponin (Point of Care Test) No results for input(s): TROPIPOC in the last 72 hours. No results for input(s): CKTOTAL, CKMB, TROPONINI in the last 72 hours. Lab Results  Component Value Date   WBC 12.9* 03/18/2016   HGB 9.3* 03/18/2016   HCT  28.3* 03/18/2016   MCV 91.9 03/18/2016   PLT 172 03/18/2016    Recent Labs Lab 03/16/16 1419  03/18/16 1836 03/18/16 1849  NA 141  < > 141  --   K 3.6  < > 4.3  --   CL 107  < > 105  --   CO2 24  --   --   --   BUN 9  < >  7  --   CREATININE 0.71  < > 0.60* 0.64  CALCIUM 9.2  --   --   --   PROT 6.9  --   --   --   BILITOT 0.7  --   --   --   ALKPHOS 95  --   --   --   ALT 21  --   --   --   AST 27  --   --   --   GLUCOSE 90  < > 103*  --   < > = values in this interval not displayed. Lab Results  Component Value Date   CHOL 159 10/16/2015   HDL 49 10/16/2015   LDLCALC 102* 10/16/2015   TRIG 41 10/16/2015   No results found for: [facility]   Radiology Studies    Dg Chest 2 View  03/16/2016  CLINICAL DATA:  Preoperative evaluation for upcoming cardiac surgery EXAM: CHEST  2 VIEW COMPARISON:  02/05/2016 FINDINGS: Cardiac shadow is at the upper limits of normal in size. The thoracic aorta is tortuous. The lungs are clear bilaterally. No acute bony abnormality is seen. Postsurgical changes are noted in the proximal right humerus. IMPRESSION: No active cardiopulmonary disease. Electronically Signed   By: Inez Catalina M.D.   On: 03/16/2016 14:41   Dg Chest Port 1 View  03/18/2016  CLINICAL DATA:  Atelectasis EXAM: PORTABLE CHEST 1 VIEW COMPARISON:  03/16/2016 FINDINGS: The patient is status post median sternotomy and cardiac valve replacement. No infiltrate or pulmonary edema. Endotracheal tube in place with tip 2.3 cm above the carina. Right IJ central line with tip in distal SVC. Right IJ Swan-Ganz catheter with tip in the region of main pulmonary artery. Mediastinal drain in place. Right chest tube in place. Tiny right apical pneumothorax. IMPRESSION: No infiltrate or pulmonary edema. Endotracheal tube in place with tip 2.3 cm above the carina. Right IJ central line with tip in distal SVC. Right IJ Swan-Ganz catheter with tip in the region of main pulmonary artery. Mediastinal drain in  place. Right chest tube in place. Tiny right apical pneumothorax. Electronically Signed   By: Lahoma Crocker M.D.   On: 03/18/2016 13:20    ECG & Cardiac Imaging    As described above  Assessment & Plan    Mr Shippy is post AVR. He complains of acute pain after surgery. Taking all together it appears that he has pericarditis. His ECg and symptoms could certainly indicate an acute MI but the timing and his recent LHC suggest otherwise. His troponin came back elevated at >4 (tropI). Since this is a post bypass trop I do expect it to be elevated. Per literature review 6-12h post bypass Trop T is usually around 1 for pure AVR. CK MB was >20. His ST changes appear stable without q wave changes. I discussed my assessment with Dr Roxan Hockey and he agrees> we will continue to monitor and treat symptoms.  Recommendations: - Cochizine 1.47m now and 0.6 qday - trend ECG q 2h for now and repeat cardiac enzymes in 2 hours. . Should the cardiac biomarkers cont to rise significantly or th ECG shows dynamic Changes will reevaluate and consider LHC.    Signed, MCristina Gong MD 03/19/2016, 1:21 AM  Reviewed Dr. FTeofilo Podfindings and evaluation, agree with his note. Patient examined/reevaluated this AM.  Continues to have chest pain, but mostly the features suggest post-surgical chest wall pain or pericarditis (sharp, intensified with movement and deep breath). ECG with upwardly concave  ST elevation V1-V2, inferior ST depression and T wave inversion. Troponin stable, not trending upward, more consistent with periop injury or pericarditis, rather than an acute coronary event. Hemodynamically compensated, on IV NTG. Plan repeat echo in 2-3 days when we are more likely to obtain interpretable images. For now, I do not believe there is an indication for cardiac cath.  Sanda Klein, MD, [facility] [phone] office [phone] pager 03/19/2016 9:03 AM

## 2016-03-19 NOTE — Progress Notes (Signed)
TCTS BRIEF SICU PROGRESS NOTE  1 Day Post-Op  S/P Procedure(s) (LRB): AORTIC VALVE REPLACEMENT (AVR) implanted magna ease 31mm (N/A) TRANSESOPHAGEAL ECHOCARDIOGRAM (TEE) (N/A)   Doing well Minimal pain NSR w/ stable BP although still on NTG drip for HTN UOP adequate Labs okay  Plan: Increase carvedilol dose.  Diuresis.  Add labetalol as needed for hypertension  [name], MD 03/19/2016 7:30 PM

## 2016-03-19 NOTE — Progress Notes (Signed)
      [address]       [place] [address]             [phone]      Rn called re: patient having chest pain  ECG shows some ST elevation anteriorly and T wave inversion inferiorly not significantly different from his initial postop ECG.  Reviewed cath note- has a small branch in distal circumflex with a tight stenosis. Otherwise normal coronaries.  I doubt this is an MI, very unlikely in this setting but will check enzymes and ask Cardiology to review ECGs  Mazal Ebey C. Roxan Hockey, MD [facility] [phone]

## 2016-03-20 ENCOUNTER — Inpatient Hospital Stay (HOSPITAL_COMMUNITY): Payer: Medicare HMO

## 2016-03-20 LAB — BASIC METABOLIC PANEL
ANION GAP: 5 (ref 5–15)
BUN: 12 mg/dL (ref 6–20)
CO2: 27 mmol/L (ref 22–32)
Calcium: 8 mg/dL — ABNORMAL LOW (ref 8.9–10.3)
Chloride: 101 mmol/L (ref 101–111)
Creatinine, Ser: 0.71 mg/dL (ref 0.61–1.24)
GFR calc Af Amer: >60 mL/min (ref >60)
GLUCOSE: 152 mg/dL — AB (ref 65–99)
POTASSIUM: 4.2 mmol/L (ref 3.5–5.1)
Sodium: 133 mmol/L — ABNORMAL LOW (ref 135–145)

## 2016-03-20 LAB — GLUCOSE, CAPILLARY
GLUCOSE-CAPILLARY: 159 mg/dL — AB (ref 65–99)
Glucose-Capillary: 129 mg/dL — ABNORMAL HIGH (ref 65–99)
Glucose-Capillary: 140 mg/dL — ABNORMAL HIGH (ref 65–99)

## 2016-03-20 LAB — CBC
HEMATOCRIT: 19.6 % — AB (ref 39.0–52.0)
HEMOGLOBIN: 6.7 g/dL — AB (ref 13.0–17.0)
MCH: 31.2 pg (ref 26.0–34.0)
MCHC: 34.2 g/dL (ref 30.0–36.0)
MCV: 91.2 fL (ref 78.0–100.0)
Platelets: 110 10*3/uL — ABNORMAL LOW (ref 150–400)
RBC: 2.15 MIL/uL — ABNORMAL LOW (ref 4.22–5.81)
RDW: 14.7 % (ref 11.5–15.5)
WBC: 11.5 10*3/uL — ABNORMAL HIGH (ref 4.0–10.5)

## 2016-03-20 LAB — PREPARE RBC (CROSSMATCH)

## 2016-03-20 MED ORDER — SODIUM CHLORIDE 0.9% FLUSH
3.0000 mL | Freq: Two times a day (BID) | INTRAVENOUS | Status: DC
  Administered 2016-03-20 – 2016-03-23 (×5): 3 mL via INTRAVENOUS

## 2016-03-20 MED ORDER — LEVALBUTEROL HCL 0.63 MG/3ML IN NEBU
0.6300 mg | INHALATION_SOLUTION | Freq: Two times a day (BID) | RESPIRATORY_TRACT | Status: DC
  Administered 2016-03-20 – 2016-03-22 (×5): 0.63 mg via RESPIRATORY_TRACT
  Filled 2016-03-20 (×6): qty 3

## 2016-03-20 MED ORDER — POTASSIUM CHLORIDE CRYS ER 20 MEQ PO TBCR
20.0000 meq | EXTENDED_RELEASE_TABLET | Freq: Every day | ORAL | Status: DC
  Administered 2016-03-21 – 2016-03-23 (×3): 20 meq via ORAL
  Filled 2016-03-20 (×5): qty 1

## 2016-03-20 MED ORDER — LISINOPRIL 40 MG PO TABS
40.0000 mg | ORAL_TABLET | Freq: Every day | ORAL | Status: DC
  Administered 2016-03-20 – 2016-03-23 (×4): 40 mg via ORAL
  Filled 2016-03-20 (×2): qty 1
  Filled 2016-03-20: qty 2
  Filled 2016-03-20: qty 1

## 2016-03-20 MED ORDER — SODIUM CHLORIDE 0.9 % IV SOLN: 250.0000 mL | INTRAVENOUS | Status: DC | PRN

## 2016-03-20 MED ORDER — FUROSEMIDE 10 MG/ML IJ SOLN
40.0000 mg | Freq: Once | INTRAMUSCULAR | Status: AC
  Administered 2016-03-20: 40 mg via INTRAVENOUS
  Filled 2016-03-20: qty 4

## 2016-03-20 MED ORDER — MOVING RIGHT ALONG BOOK
Freq: Once | Status: AC
  Administered 2016-03-20: 11:00:00
  Filled 2016-03-20: qty 1

## 2016-03-20 MED ORDER — FUROSEMIDE 40 MG PO TABS
40.0000 mg | ORAL_TABLET | Freq: Every day | ORAL | Status: DC
  Administered 2016-03-21 – 2016-03-23 (×3): 40 mg via ORAL
  Filled 2016-03-20 (×3): qty 1

## 2016-03-20 MED ORDER — SODIUM CHLORIDE 0.9% FLUSH: 3.0000 mL | INTRAVENOUS | Status: DC | PRN

## 2016-03-20 MED ORDER — SODIUM CHLORIDE 0.9 % IV SOLN
Freq: Once | INTRAVENOUS | Status: AC
  Administered 2016-03-20: 07:00:00 via INTRAVENOUS

## 2016-03-20 NOTE — Progress Notes (Signed)
CM received call from [place] as no CM has spoken with family about short term SNF; CM explained no consult has been placed for placement and this CM will call CSW and place CSW consult for placement. No other CM needs were communicated.

## 2016-03-20 NOTE — Progress Notes (Signed)
Subjective:  He currently is having his chest tubes pulled.  He has some pleuritic pain on the right side but no anterior type chest pain.  There was no EKG done this morning.  Troponin was 4 but that was last night and there have not been serial troponins.  No anterior chest pain suggestive of ischemia.  Objective:  Vital Signs in the last 24 hours: BP 139/86 mmHg  Pulse 83  Temp(Src) 98.8 F (37.1 C) (Oral)  Resp 17  Ht 5\' 4"  (1.626 m)  Wt 53.4 kg (117 lb 11.6 oz)  BMI 20.20 kg/m2  SpO2 100%  Physical Exam: Thin black male lying in bed in no acute distress  Lungs: Reduced breath sounds, mediastinal tubes in place Cardiac:  Regular rhythm, normal S1 and S2, no S3, no definite rub heard Extremities:  1+ edema present  Intake/Output from previous day: 07/14 0701 - 07/15 0700 In: 478 [I.V.:378; IV Piggyback:100] Out: 1650 [Urine:1200; Chest Tube:450]  Weight Filed Weights   03/18/16 0557 03/19/16 0500 03/20/16 0400  Weight: 53.978 kg (119 lb) 55.9 kg (123 lb 3.8 oz) 53.4 kg (117 lb 11.6 oz)    Lab Results: Basic Metabolic Panel:  Recent Labs  03/19/16 0425  03/19/16 1721 03/20/16 0300  NA 133*  --  136 133*  K 4.2  --  4.0 4.2  CL 105  --  99* 101  CO2 23  --   --  27  GLUCOSE 88  --  121* 152*  BUN 6  --  9 12  CREATININE 0.60*  < > 0.70 0.71  < > = values in this interval not displayed. CBC:  Recent Labs  03/19/16 1720 03/19/16 1721 03/20/16 0300  WBC 12.9*  --  11.5*  HGB 8.1* 9.2* 6.7*  HCT 24.5* 27.0* 19.6*  MCV 90.7  --  91.2  PLT 134*  --  110*    Cardiac Panel (last 3 results)  Recent Labs  03/19/16 0032 03/19/16 0351  CKTOTAL 273 297  CKMB 26.2* 21.5*  TROPONINI 4.23* 4.22*  RELINDX 9.6* 7.2*    Telemetry: Sinus rhythm   Assessment/Plan:  1.  Recurrent anterior type chest pain with pleuritic features yesterday-there is not been a follow-up EKG or troponins.  The symptoms have resolved and were somewhat atypical. 2.  Previous aortic  valve replacement 3.  Pleuritic chest pain likely postoperative 4.  Severe postoperative anemia due to blood loss  Recommendations:  Consider transfusion.  Serial troponins and repeat EKG today.     Kerry Hough  MD [facility]  03/20/2016, 1:10 PM

## 2016-03-20 NOTE — Progress Notes (Signed)
CRITICAL VALUE ALERT  Critical value received:  Hgb 6.7  Date of notification:  7/15  Time of notification:  [id number]  Critical value read back:Yes.    Nurse who received alert:  [employer] MD:  DR [name] notified   Time MD responded:  [phone]

## 2016-03-20 NOTE — Progress Notes (Signed)
TCTS BRIEF SICU PROGRESS NOTE  2 Days Post-Op  S/P Procedure(s) (LRB): AORTIC VALVE REPLACEMENT (AVR) implanted magna ease 78mm (N/A) TRANSESOPHAGEAL ECHOCARDIOGRAM (TEE) (N/A)   Feels well, much better after chest tubes out  Plan: Continue current plan.  Awaiting bed for transfer step down.  [name], MD 03/20/2016 6:11 PM

## 2016-03-20 NOTE — Progress Notes (Signed)
[address]       [place] [address]             [phone]        CARDIOTHORACIC SURGERY PROGRESS NOTE   R2 Days Post-Op Procedure(s) (LRB): AORTIC VALVE REPLACEMENT (AVR) implanted magna ease 26mm (N/A) TRANSESOPHAGEAL ECHOCARDIOGRAM (TEE) (N/A)  Subjective: Looks good and reports feeling well.  Mild soreness in chest.  No SOB  Objective: Vital signs: BP Readings from Last 1 Encounters:  03/20/16 114/75   Pulse Readings from Last 1 Encounters:  03/20/16 72   Resp Readings from Last 1 Encounters:  03/20/16 19   Temp Readings from Last 1 Encounters:  03/20/16 98.4 F (36.9 C) Oral    Hemodynamics:    Physical Exam:  Rhythm:   sinus  Breath sounds: clear  Heart sounds:  RRR  Incisions:  Dressing dry, intact  Abdomen:  Soft, non-distended, non-tender  Extremities:  Warm, well-perfused  Chest tubes:  Low volume thin serosanguinous output, no air leak    Intake/Output from previous day: 07/14 0701 - 07/15 0700 In: 478 [I.V.:378; IV Piggyback:100] Out: [id number] [Urine:1200; Chest Tube:450] Intake/Output this shift: Total I/O In: 402 [I.V.:27; Blood:325; IV Piggyback:50] Out: 40 [Chest Tube:40]  Lab Results:  CBC: Recent Labs  03/19/16 1720 03/19/16 1721 03/20/16 0300  WBC 12.9*  --  11.5*  HGB 8.1* 9.2* 6.7*  HCT 24.5* 27.0* 19.6*  PLT 134*  --  110*    BMET:  Recent Labs  03/19/16 0425  03/19/16 1721 03/20/16 0300  NA 133*  --  136 133*  K 4.2  --  4.0 4.2  CL 105  --  99* 101  CO2 23  --   --  27  GLUCOSE 88  --  121* 152*  BUN 6  --  9 12  CREATININE 0.60*  < > 0.70 0.71  CALCIUM 7.7*  --   --  8.0*  < > = values in this interval not displayed.   PT/INR:   Recent Labs  03/18/16 1200  LABPROT 17.4*  INR 1.42    CBG (last 3)   Recent Labs  03/19/16 2340 03/20/16 0343 03/20/16 0834  GLUCAP 140* 159* 129*    ABG    Component Value Date/Time   PHART 7.399 03/18/2016 2135   PCO2ART 37.6 03/18/2016  2135   PO2ART 108.0* 03/18/2016 2135   HCO3 23.2 03/18/2016 2135   TCO2 25 03/19/2016 1721   ACIDBASEDEF 1.0 03/18/2016 2135   O2SAT 98.0 03/18/2016 2135    CXR: PORTABLE CHEST 1 VIEW  COMPARISON: 03/19/2016, 03/16/2013, 03/18/2016  FINDINGS: Cardiomediastinal silhouette unchanged in size and contour. Calcifications of the aortic arch.  Surgical changes of median sternotomy and aortic valve replacement.  Interval removal of the Swan-Ganz catheter. Right IJ sheath and right IJ central venous catheter remain in position, terminating superior vena cava.  Mediastinal/left pleural drains unchanged.  Developing vague opacity at the right base. Blunting of the left costophrenic angle.  No visualized pneumothorax.  IMPRESSION: Interval development of vague opacity at the right base, compatible with developing atelectasis of the right lower lobe.  Interval removal of Swan-Ganz catheter, with unchanged position of right IJ sheath and right IJ central venous catheter.  Unchanged mediastinal and left pleural drains. No visualized pneumothorax.  Surgical changes of median sternotomy and aortic valve replacement.  Signed,  Dulcy Fanny. Earleen Newport, DO  [facility]  [facility]   Electronically Signed  By: Corrie Mckusick D.O.  On: 03/20/2016 09:08  Assessment/Plan: S/P Procedure(s) (LRB): AORTIC VALVE REPLACEMENT (AVR) implanted magna ease 48mm (N/A) TRANSESOPHAGEAL ECHOCARDIOGRAM (TEE) (N/A)  Doing well POD2 Maintaining NSR w/ stable BP, still on IV NTG for HTN Breathing comfortably w/ O2 sats 99% on 2 L/min Expected post op acute blood loss anemia, Hgb down to 6.7 this morning - no signs of active bleeding Chronic combined systolic and diastolic CHF with expected post-op volume excess, stable Expected post op atelectasis, mild Hypertension   Transfuse 2 units PRBC's  Mobilize  Diuresis  Restart  lisinopril  D/C chest tubes  Transfer 2W stepdown once stable off NTG drip   [name], MD 03/20/2016 10:45 AM

## 2016-03-21 ENCOUNTER — Inpatient Hospital Stay (HOSPITAL_COMMUNITY): Payer: Medicare HMO

## 2016-03-21 LAB — BASIC METABOLIC PANEL
ANION GAP: 8 (ref 5–15)
BUN: 15 mg/dL (ref 6–20)
CHLORIDE: 100 mmol/L — AB (ref 101–111)
CO2: 26 mmol/L (ref 22–32)
Calcium: 8.6 mg/dL — ABNORMAL LOW (ref 8.9–10.3)
Creatinine, Ser: 0.6 mg/dL — ABNORMAL LOW (ref 0.61–1.24)
GFR calc non Af Amer: >60 mL/min (ref >60)
Glucose, Bld: 115 mg/dL — ABNORMAL HIGH (ref 65–99)
Potassium: 3.8 mmol/L (ref 3.5–5.1)
Sodium: 134 mmol/L — ABNORMAL LOW (ref 135–145)

## 2016-03-21 LAB — TYPE AND SCREEN
ABO/RH(D): B POS
ANTIBODY SCREEN: NEGATIVE
UNIT DIVISION: 0
Unit division: 0

## 2016-03-21 LAB — CBC
HEMATOCRIT: 28.9 % — AB (ref 39.0–52.0)
HEMOGLOBIN: 9.6 g/dL — AB (ref 13.0–17.0)
MCH: 28.7 pg (ref 26.0–34.0)
MCHC: 33.2 g/dL (ref 30.0–36.0)
MCV: 86.5 fL (ref 78.0–100.0)
Platelets: 116 10*3/uL — ABNORMAL LOW (ref 150–400)
RBC: 3.34 MIL/uL — ABNORMAL LOW (ref 4.22–5.81)
RDW: 16.8 % — ABNORMAL HIGH (ref 11.5–15.5)
WBC: 11.5 10*3/uL — AB (ref 4.0–10.5)

## 2016-03-21 MED ORDER — AMLODIPINE BESYLATE 5 MG PO TABS
5.0000 mg | ORAL_TABLET | Freq: Every day | ORAL | Status: DC
Start: 2016-03-21 — End: 2016-03-22
  Administered 2016-03-21: 5 mg via ORAL
  Filled 2016-03-21: qty 1

## 2016-03-21 MED ORDER — LACTULOSE 10 GM/15ML PO SOLN
20.0000 g | Freq: Once | ORAL | Status: AC
  Administered 2016-03-21: 20 g via ORAL
  Filled 2016-03-21: qty 30

## 2016-03-21 MED ORDER — POTASSIUM CHLORIDE CRYS ER 20 MEQ PO TBCR
20.0000 meq | EXTENDED_RELEASE_TABLET | Freq: Once | ORAL | Status: AC
  Administered 2016-03-21: 20 meq via ORAL

## 2016-03-21 NOTE — Progress Notes (Signed)
03/21/2016 1500 Pt ambulated 700 feet on RA with no assistance.  Pt tolerated well. [place]

## 2016-03-21 NOTE — Progress Notes (Signed)
03/21/2016 1545 EPW D/C'd per order and per protocol.  Ends intact. Pt. Tolerated well.  Advised bedrest x1hr.  Call bell in reach.  Vital signs collected per protocol. [place]

## 2016-03-21 NOTE — Discharge Instructions (Signed)
We ask the SNF to please do the following: 1. Please obtain vital signs at least one time daily 2.Please weigh the patient daily. If he or she continues to gain weight or develops lower extremity edema, contact the office at [phone]. 3. Ambulate patient at least three times daily and please use sternal precautions.    Aortic Valve Replacement, Care After Refer to this sheet in the next few weeks. These instructions provide you with information on caring for yourself after your procedure. Your health care provider may also give you specific instructions. Your treatment has been planned according to current medical practices, but problems sometimes occur. Call your health care provider if you have any problems or questions after your procedure. HOME CARE INSTRUCTIONS   Take medicines only as directed by your health care provider.  If your health care provider has prescribed elastic stockings, wear them as directed.  Take frequent naps or rest often throughout the day.  Avoid lifting over 10 lbs (4.5 kg) or pushing or pulling things with your arms for 6-8 weeks or as directed by your health care provider.  Avoid driving or airplane travel for 4-6 weeks after surgery or as directed by your health care provider. If you are riding in a car for an extended period, stop every 1-2 hours to stretch your legs. Keep a record of your medicines and medical history with you when traveling.  Do not drive or operate heavy machinery while taking pain medicine. (narcotics).  Do not cross your legs.  Do not use any tobacco products including cigarettes, chewing tobacco, or electronic cigarettes. If you need help quitting, ask your health care provider.  Do not take baths, swim, or use a hot tub until your health care provider approves. Take showers once your health care provider approves. Pat incisions dry. Do not rub incisions with a washcloth or towel.  Avoid climbing stairs and using the handrail  to pull yourself up for the first 2-3 weeks after surgery.  Return to work as directed by your health care provider.  Drink enough fluid to keep your urine clear or pale yellow.  Do not strain to have a bowel movement. Eat high-fiber foods if you become constipated. You may also take a medicine to help you have a bowel movement (laxative) as directed by your health care provider.  Resume sexual activity as directed by your health care provider. Men should not use medicines for erectile dysfunction until their doctor says it isokay.  If you had a certain type of heart condition in the past, you may need to take antibiotic medicine before having dental work or surgery. Let your dentist and health care providers know if you had one or more of the following:  Previous endocarditis.  An artificial (prosthetic) heart valve.  Congenital heart disease. SEEK MEDICAL CARE IF:  You develop a skin rash.   You experience sudden changes in your weight.  You have a fever. SEEK IMMEDIATE MEDICAL CARE IF:   You develop chest pain that is not coming from your incision.  You have drainage (pus), redness, swelling, or pain at your incision site.   You develop shortness of breath or have difficulty breathing.   You have increased bleeding from your incision site.   You develop light-headedness.  MAKE SURE YOU:   Understand these directions.  Will watch your condition.  Will get help right away if you are not doing well or get worse.   This information is not intended  to replace advice given to you by your health care provider. Make sure you discuss any questions you have with your health care provider.   Document Released: 03/11/2005 Document Revised: 09/13/2014 Document Reviewed: 06/06/2012 Elsevier Interactive Patient Education [employer].

## 2016-03-21 NOTE — Progress Notes (Addendum)
      [address]       [place] [address]             [phone]        3 Days Post-Op Procedure(s) (LRB): AORTIC VALVE REPLACEMENT (AVR) implanted magna ease 55mm (N/A) TRANSESOPHAGEAL ECHOCARDIOGRAM (TEE) (N/A)  Subjective: Patient has not had bowel movement yet. Otherwise, no complaints.  Objective: Vital signs in last 24 hours: Temp:  [98.4 F (36.9 C)-99.3 F (37.4 C)] 99.3 F (37.4 C) (07/16 0500) Pulse Rate:  [64-88] 64 (07/16 0648) Cardiac Rhythm:  [-] Normal sinus rhythm (07/15 1900) Resp:  [14-32] 18 (07/16 0500) BP: (109-167)/(69-105) 167/102 mmHg (07/16 0648) SpO2:  [95 %-100 %] 100 % (07/16 0500) Weight:  [121 lb 7.6 oz (55.1 kg)] 121 lb 7.6 oz (55.1 kg) (07/16 0500)  Pre op weight 53.9 kg Current Weight  03/21/16 121 lb 7.6 oz (55.1 kg)       Intake/Output from previous day: 07/15 0701 - 07/16 0700 In: 1002 [P.O.:240; I.V.:52; Blood:660; IV Piggyback:50] Out: [id number] [Urine:1225; Chest Tube:60]   Physical Exam:  Cardiovascular: RRR, no murmur Pulmonary: Slightly diminished at bases Abdomen: Soft, non tender, bowel sounds present. Extremities: Trace bilateral lower extremity edema. Wounds: Clean and dry.  No erythema or signs of infection.  Lab Results: CBC: Recent Labs  03/20/16 0300 03/21/16 0240  WBC 11.5* 11.5*  HGB 6.7* 9.6*  HCT 19.6* 28.9*  PLT 110* 116*   BMET:  Recent Labs  03/20/16 0300 03/21/16 0618  NA 133* 134*  K 4.2 3.8  CL 101 100*  CO2 27 26  GLUCOSE 152* 115*  BUN 12 15  CREATININE 0.71 0.60*  CALCIUM 8.0* 8.6*    PT/INR:  Lab Results  Component Value Date   INR 1.42 03/18/2016   INR 0.99 03/16/2016   INR 1.05 02/27/2016   ABG:  INR: Will add last result for INR, ABG once components are confirmed Will add last 4 CBG results once components are confirmed  Assessment/Plan:  1. CV - SR in the 60's. Hypertensive. On Coreg 6.25 mg bid and Lisinopril 40 mg daily. Will start Norvasc for  better BP control. 2.  Pulmonary - On room air. CXR this am shows small bilateral pleural effusions and atelectasis, no pneumothorax. Encourage incentive spirometer. 3. Volume Overload - On Lasix 40 mg daily 4.  Acute blood loss anemia - H and H stable at 9.6 and 28.9 (s/p transfusion). 5. Supplement potassium 6. Mild thrombocytopenia- platelet count up to 116,000 7. Remove EPW 8. LOC constipation 9. Possibly to SNF in 1-2 days  ZIMMERMAN,DONIELLE MPA-C 03/21/2016,8:13 AM  I have seen and examined the patient and agree with the assessment and plan as outlined.  Doing well.  Will need short term placement since he lives alone and has nobody to stay with him following d/c  [name], MD 03/21/2016 11:30 AM

## 2016-03-21 NOTE — Discharge Summary (Signed)
Physician Discharge Summary       [address]       [place] [address]             [phone]    Patient ID: Alexis [name] MRN: [id number] DOB/AGE: 65-Aug-1952 65 y.o.  Admit date: 03/18/2016 Discharge date: 03/23/2016  Admission Diagnoses: Severe aortic insufficiency  Active Diagnoses:  1.Essential hypertension 2. Protein-calorie malnutrition, severe 3. Pulmonary hypertension ([place]) 4.Tricuspid regurgitation 5. Hepatitis C 6. Stroke (cerebrum, HCC) 7. Protein calorie malnutrition ([place]) 8. Seizure ([place]) 9. GERD (gastroesophageal reflux disease) 10. Depression 11. Chronic systolic CHF (congestive heart failure) ([place]) 12. Nephrolithiasis 13. Mild thrombocytopenia 14. ABL anemia  Consults: cardiology  Procedure (s):   Aortic Valve Replacement Edwards Magna Ease Pericardial Tissue Valve (size 27 mm, model # 3300TFX, serial # [id number]) by Dr. [name] on 03/18/2016.  History of Presenting Illness:  Patient is a 65 year old African-American male with history of hypertension, hepatitis C, seizure disorder, and recent stroke who has been referred for surgical consultation to discuss treatment options for management of severe symptomatic aortic insufficiency with acute combined systolic and diastolic congestive heart failure. The patient is a service connected veteran and has been followed through the [place] medical system for many years. He is not certain about any previous diagnosis of heart valve disease, but he thinks that his primary care physician may have told him that he had a heart murmur in the past. He had never been formally evaluated by a cardiologist until recently. The patient presented acutely for hospital admission on 10/15/2015 with severe resting shortness of breath, cough, chest pain, headache, nausea, vomiting, and diarrhea. He described a 3 month history of anorexia and associated 35 pounds weight loss. He reports history of low-grade  fevers and night sweats. Part to his hospital admission he had been treated as an outpatient with oral antibiotics for a presumed urinary tract infection. The patient was diagnosed with acute hypoxic respiratory failure secondary to acute congestive heart failure with possible bronchitis. BNP level was elevated 2457. CT angiogram of the chest reveals an artifact in the left lower lobe pulmonary artery that was felt to be an artifact and not consistent with acute pulmonary embolus. There was cardiac enlargement and pulmonary edema. Transthoracic echocardiogram revealed severe aortic insufficiency with moderate global left ventricular systolic dysfunction, ejection fraction estimated 40-45%, severe pulmonary hypertension and moderate tricuspid regurgitation. Cardiology was not consulted. The patient's shortness of breath improved with medical therapy including antibiotics, bronchodilators, lisinopril, and Lasix. MRI of the brain was performed at the time of the patient's initial presentation in notable for a small acute infarct in the right frontal lobe. The patient had reportedly experienced a transient episode of leg weakness that had resolved. The patient also states he had some difficulty with speech. The patient was evaluated by neurology and a thorough search for possible source of embolization was recommended. CT angiogram of the head and neck was unrevealing. A transesophageal echocardiogram was not performed. Blood cultures were not obtained. The patient was discharged from the hospital on oral azithromycin for presumed bronchitis.  The patient was readmitted to the hospital 02/05/2016 with severe exertional shortness of breath, orthopnea, and PND. BNP level was greater than 4500. Follow-up transthoracic echocardiogram revealed severe aortic insufficiency. There was at least moderate left ventricular systolic dysfunction with ejection fraction estimated 35-40%. The patient was evaluated by cardiology  and treated with intravenous Lasix with good response. He underwent left and right heart catheterization 02/09/2016 revealing essentially normal  coronary arteries with 90% stenosis of a very small-caliber third obtuse marginal branch of the left circumflex and otherwise minimal nonobstructive disease. There was moderate to severe left ventricular systolic dysfunction with severe aortic insufficiency. There was mild pulmonary hypertension. The patient developed right groin pseudoaneurysm following that required ultrasound-guided thrombosis. Transesophageal echocardiogram was performed prior to hospital discharge and confirmed the presence of severe aortic insufficiency. No obvious vegetations were seen. The patient diuresed more than 20 pounds of fluid and experienced significant clinical improvement. He was discharged from the hospital with plans for elective cardiothoracic surgical consultation as an outpatient.  The patient is married but has been separated from his wife for several years. He lives alone in [place]. He has 3 adult children and several grandchildren. 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 He has been retired for approximately 5 years. Prior to becoming ill the patient states that he traveled fairly regularly, primarily to visit his family. He describes a 4-5 months history of anorexia, weight loss, low-grade fever, night sweats, and progressive shortness of breath. He has not had chest pain or chest tightness. He has not had dizzy spells or syncope. His breathing has been much better since his recent hospital discharge although he has gained some weight over the past week. He denies any productive cough. Orthopnea has improved. He does not have lower extremity edema. He still gets night sweats. He denies migratory arthritis or arthralgias.  Patient returns to the office today for follow-up of stage D severe symptomatic aortic insufficiency with tentative plans to  proceed with elective aortic valve replacement later this week. He was last seen in our office on 02/17/2016. Since then he underwent dental extraction and has been cleared for surgery by Dr. Lawana Chambers. He returns to the office today and reports no significant problems or complaints. He has mild residual soreness in his mouth from recent dental extraction. He has not had any significant bleeding. He states that he still gets short of breath intermittently but his breathing remains stable and noticeably much better than it was prior to his previous hospitalization. He denies any resting shortness of breath. He has not had any fevers, chills, or productive cough. Appetite is good.   Dr. [name] reviewed the indications, risks, and potential benefits of surgery with the patient. The patient's daughter listened in over the telephone for our conversation. Discussion was held comparing the relative risks of mechanical valve replacement with need for lifelong anticoagulation versus use of a bioprosthetic tissue valve and the associated potential for late structural valve deterioration and failure. This discussion was placed in the context of the patient's particular circumstances, and as a result the patient specifically requests that their valve be replaced using a bioprosthetic tissue valve . The patient also understands that though we do not anticipate need for tricuspid valve repair, we will reassess the severity of tricuspid regurgitation at the time of surgery and proceed with valve repair if indicated. The patient understands and accepts all potential associated risks of surgery. The patient was admitted on 03/18/2016 in order to undergo an aortic valve replacement.           Brief Hospital Course:  The patient was extubated the evening of surgery without difficulty. He remained afebrile and hemodynamically stable. He was weaned off Nitroglycerin drip. Gordy Councilman, a line, chest tubes, and foley were  removed early in the post operative course. Coreg was started and titrated accordingly. He was restarted on Lisinopril for  better control of his blood pressure. He was still hypertensive so he was started on Norvasc as well. He was volume over loaded and diuresed. He had ABL anemia. He did require a post op transfusion. His last H and H was up to 9.6 and 28.9. He also had mild thrombocytopenia. His last platelet coutn was up to 116,000. He was weaned off the insulin drip. . The patient's HGA1C pre op was 5.  The patient was felt surgically stable for transfer from the ICU to [facility]U for further convalescence on 03/21/2016. He continues to progress with cardiac rehab. He was ambulating on room air. He has been tolerating a diet and has had a bowel movement. Epicardial pacing wires were removed on 03/21/2016. Chest tube sutures were removed today. The patient is felt surgically stable for discharge to SNF  today.   Latest Vital Signs: Blood pressure 136/90, pulse 72, temperature 98.1 F (36.7 C), temperature source Oral, resp. rate 18, height 5\' 4"  (1.626 m), weight 112 lb 11.2 oz (51.12 kg), SpO2 100 %.  Physical Exam: Cardiovascular: RRR, no murmur Pulmonary: Slightly diminished at bases Abdomen: Soft, non tender, bowel sounds present. Extremities: Trace bilateral lower extremity edema. Wounds: Clean and dry. No erythema or signs of infection.  Discharge Condition:Stable and discharged to SNF.  Recent laboratory studies:  Lab Results  Component Value Date   WBC 11.5* 03/21/2016   HGB 9.6* 03/21/2016   HCT 28.9* 03/21/2016   MCV 86.5 03/21/2016   PLT 116* 03/21/2016   Lab Results  Component Value Date   NA 134* 03/21/2016   K 3.8 03/21/2016   CL 100* 03/21/2016   CO2 26 03/21/2016   CREATININE 0.60* 03/21/2016   GLUCOSE 115* 03/21/2016    Diagnostic Studies: Dg Chest 2 View  03/21/2016  CLINICAL DATA:  Atelectasis.  CABG 3 days ago.  Left chest soreness. EXAM: CHEST  2 VIEW  COMPARISON:  One-view chest x-ray 03/20/2016. FINDINGS: The heart is enlarged. The patient is status post median sternotomy for CABG and aortic valve replacement. The right IJ lines have been removed. Mediastinal drain has been removed. Epicardial pacing wires remain. Lung volumes are improved. Aeration is improved. Small bilateral pleural effusions persist. There is associated atelectasis. IMPRESSION: 1. Improving aeration in lung volumes. 2. Interval removal of right IJ lines and mediastinal drains. 3. Persistent small bilateral pleural effusions and associated airspace disease, likely atelectasis. 4. Stable cardiomegaly without failure. 5. Epicardial pacing wires. Electronically Signed   By: San Morelle M.D.     Discharge Medications:   Medication List    STOP taking these medications        furosemide 40 MG tablet  Commonly known as:  LASIX      TAKE these medications        amLODipine 10 MG tablet  Commonly known as:  NORVASC  Take 1 tablet (10 mg total) by mouth daily.     aspirin 325 MG EC tablet  Take 1 tablet (325 mg total) by mouth daily.     atorvastatin 10 MG tablet  Commonly known as:  LIPITOR  Take 1 tablet (10 mg total) by mouth daily at 6 PM.     carvedilol 6.25 MG tablet  Commonly known as:  COREG  Take 1 tablet (6.25 mg total) by mouth 2 (two) times daily.     chlorhexidine 0.12 % solution  Commonly known as:  PERIDEX  Rinse with 15 mls twice daily for 30 seconds. Use after breakfast and  at bedtime. Spit out excess. Do not swallow.     feeding supplement (ENSURE ENLIVE) Liqd  Take 237 mLs by mouth 3 (three) times daily between meals.     lisinopril 40 MG tablet  Commonly known as:  PRINIVIL,ZESTRIL  Take 1 tablet (40 mg total) by mouth daily.     oxyCODONE 5 MG immediate release tablet  Commonly known as:  Oxy IR/ROXICODONE  One tablet by mouth every 4-6 hours PRN pain       The patient has been discharged on:   1.Beta Blocker:  Yes [ x  ]                               No   [   ]                              If No, reason:  2.Ace Inhibitor/ARB: Yes [ x  ]                                     No  [    ]                                     If No, reason:  3.Statin:   Yes [ x  ]                  No  [   ]                  If No, reason:  4.Ecasa:  Yes  [ x  ]                  No   [   ]                  If No, reason:  Follow Up Appointments: Follow-up Information    Follow up with [name], MD On 04/19/2016.   Specialty:  Cardiothoracic Surgery   Why:  PA/LAT CXR to be taken (at [facility] which is in the same building as Dr. Guy Sandifer office) on 04/19/2016 at 1:00 pm;Appointment time is at 1:30 pm   Contact information:   [place] [address] [phone]       Follow up with [name], MD On 03/23/2016.   Specialty:  Cardiology   Why:  Call for a follow up appointment for 2 weeks   Contact information:   [address] [place] [address] [phone]       Signed: Lars Pinks MPA-C 03/23/2016, 5:56 PM

## 2016-03-21 NOTE — NC FL2 (Signed)
[place] MEDICAID FL2 LEVEL OF CARE SCREENING TOOL     IDENTIFICATION  Patient Name: Alexis [name] Birthdate: 10/04/1950 Sex: male Admission Date (Current Location): 03/18/2016  [facility] and [place] Number:  Herbalist and Address:  [facility], [place] [address], [place], [place] [id number]      Provider Number: [id number]  Attending Physician Name and Address:  [name], MD  Relative Name and Phone Number:       Current Level of Care: Hospital Recommended Level of Care: [place] Prior Approval Number:    Date Approved/Denied:   PASRR Number: [id number]  Discharge Plan: SNF    Current Diagnoses: Patient Active Problem List   Diagnosis Date Noted  . S/P aortic valve replacement with bioprosthetic valve 03/18/2016  . Chest pain 03/01/2016  . Dental caries 02/25/2016  . Chronic periodontitis 02/25/2016  . Tricuspid regurgitation   . Severe aortic regurgitation 02/11/2016  . CAD in native artery 02/10/2016  . Postoperative groin pseudoaneurysm ([place]) 02/10/2016  . Aortic regurgitation   . Troponin level elevated   . Pulmonary hypertension ([place]) 02/05/2016  . History of CVA (cerebrovascular accident) 02/05/2016  . Hypokalemia 02/05/2016  . Marijuana abuse 02/05/2016  . Protein-calorie malnutrition, severe 10/17/2015  . Acute embolic stroke ([place])   . Cerebral thrombosis with cerebral infarction 10/16/2015  . Essential hypertension   . Acute respiratory failure with hypoxia ([place]) 10/15/2015  . Acute on chronic systolic CHF (congestive heart failure) ([place]) 10/15/2015  . Nausea vomiting and diarrhea 10/15/2015  . Loss of weight 10/15/2015  . Chronic hepatitis C without hepatic coma ([place])   . Chronic cluster headache, not intractable     Orientation RESPIRATION BLADDER Height & Weight     Self, Time, Situation, Place  Normal Continent Weight: 55.1 kg (121 lb 7.6 oz) Height:  5' 4"  (162.6 cm)  BEHAVIORAL  SYMPTOMS/MOOD NEUROLOGICAL BOWEL NUTRITION STATUS   (NONE) Convulsions/Seizures Continent Diet (Carb Modified)  AMBULATORY STATUS COMMUNICATION OF NEEDS Skin   Limited Assist Verbally Surgical wounds (Incision of chest, and open dental extractions.)                       Personal Care Assistance Level of Assistance  Bathing, Feeding, Dressing Bathing Assistance: Limited assistance Feeding assistance: Independent Dressing Assistance: Limited assistance     Functional Limitations Info  Sight, Hearing, Speech Sight Info: Adequate Hearing Info: Adequate Speech Info: Adequate    SPECIAL CARE FACTORS FREQUENCY  PT (By licensed PT), OT (By licensed OT)     PT Frequency: 5/week OT Frequency: 5/week            Contractures Contractures Info: Not present    Additional Factors Info  Code Status, Allergies Code Status Info: Full Code Allergies Info: NKDA           Current Medications (03/21/2016):  This is the current hospital active medication list Current Facility-Administered Medications  Medication Dose Route Frequency Provider Last Rate Last Dose  . 0.9 %  sodium chloride infusion  250 mL Intravenous Continuous [name], MD   250 mL at 03/20/16 7591  . 0.9 %  sodium chloride infusion  250 mL Intravenous PRN [name], MD      . acetaminophen (TYLENOL) tablet 1,000 mg  1,000 mg Oral Q6H [name], MD   1,000 mg at 03/21/16 1138  . amLODipine (NORVASC) tablet 5 mg  5 mg Oral Daily Donielle M  Tacy Dura, PA-C   5 mg at 03/21/16 0917  . antiseptic oral rinse solution (CORINZ)  7 mL Mouth Rinse QID [name], MD   7 mL at 03/20/16 1506  . aspirin EC tablet 325 mg  325 mg Oral Daily [name], MD   325 mg at 03/21/16 0917  . atorvastatin (LIPITOR) tablet 10 mg  10 mg Oral q1800 [name], MD   10 mg at 03/20/16 1757  . bisacodyl (DULCOLAX) EC tablet 10 mg  10 mg Oral Daily [name], MD   10 mg at 03/21/16 7026   Or  . bisacodyl  (DULCOLAX) suppository 10 mg  10 mg Rectal Daily [name], MD      . carvedilol (COREG) tablet 6.25 mg  6.25 mg Oral BID [name], MD   6.25 mg at 03/21/16 0824  . chlorhexidine gluconate (SAGE KIT) (PERIDEX) 0.12 % solution 15 mL  15 mL Mouth Rinse BID [name], MD   15 mL at 03/21/16 0800  . docusate sodium (COLACE) capsule 200 mg  200 mg Oral Daily [name], MD   200 mg at 03/21/16 0916  . feeding supplement (BOOST / RESOURCE BREEZE) liquid 1 Container  1 Container Oral TID BM [name], MD   1 Container at 03/21/16 1000  . feeding supplement (ENSURE ENLIVE) (ENSURE ENLIVE) liquid 237 mL  237 mL Oral TID BM [name], MD   237 mL at 03/21/16 1000  . furosemide (LASIX) tablet 40 mg  40 mg Oral Daily [name], MD   40 mg at 03/21/16 0917  . labetalol (NORMODYNE,TRANDATE) injection 10 mg  10 mg Intravenous Q2H PRN [name], MD   10 mg at 03/21/16 1145  . levalbuterol (XOPENEX) nebulizer solution 0.63 mg  0.63 mg Nebulization BID [name], MD   0.63 mg at 03/21/16 1007  . lisinopril (PRINIVIL,ZESTRIL) tablet 40 mg  40 mg Oral Daily [name], MD   40 mg at 03/21/16 0825  . metoprolol (LOPRESSOR) injection 2.5-5 mg  2.5-5 mg Intravenous Q2H PRN [name], MD   2.5 mg at 03/19/16 0357  . morphine 2 MG/ML injection 2-5 mg  2-5 mg Intravenous Q1H PRN [name], MD   2 mg at 03/20/16 1210  . ondansetron (ZOFRAN) injection 4 mg  4 mg Intravenous Q6H PRN [name], MD   4 mg at 03/19/16 0134  . oxyCODONE (Oxy IR/ROXICODONE) immediate release tablet 5-10 mg  5-10 mg Oral Q3H PRN [name], MD   10 mg at 03/21/16 3785  . pantoprazole (PROTONIX) EC tablet 40 mg  40 mg Oral Daily [name], MD   40 mg at 03/21/16 0917  . potassium chloride SA (K-DUR,KLOR-CON) CR tablet 20 mEq  20 mEq Oral Daily [name], MD   20 mEq at 03/21/16 1138  . sodium chloride flush (NS) 0.9 % injection 3 mL  3 mL Intravenous Q12H  [name], MD   3 mL at 03/20/16 0909  . sodium chloride flush (NS) 0.9 % injection 3 mL  3 mL Intravenous PRN [name], MD      . sodium chloride flush (NS) 0.9 % injection 3 mL  3 mL Intravenous Q12H [name], MD   3 mL at 03/21/16 0921  . sodium chloride flush (NS) 0.9 % injection 3 mL  3 mL Intravenous PRN Valentina Gu  [name], MD      . traMADol Veatrice Bourbon) tablet 50-100 mg  50-100 mg Oral Q4H PRN [name], MD   100 mg at 03/19/16 2003     Discharge Medications: Please see discharge summary for a list of discharge medications.  Relevant Imaging Results:  Relevant Lab Results:   Additional Information SSN: [id number]  Rigoberto Noel, LCSW

## 2016-03-22 MED ORDER — AMLODIPINE BESYLATE 10 MG PO TABS
10.0000 mg | ORAL_TABLET | Freq: Every day | ORAL | Status: DC
  Administered 2016-03-22 – 2016-03-23 (×2): 10 mg via ORAL
  Filled 2016-03-22 (×2): qty 1

## 2016-03-22 MED ORDER — LEVALBUTEROL HCL 0.63 MG/3ML IN NEBU: 0.6300 mg | INHALATION_SOLUTION | Freq: Three times a day (TID) | RESPIRATORY_TRACT | Status: DC | PRN

## 2016-03-22 NOTE — Care Management Important Message (Signed)
Important Message  Patient Details  Name: Alexis [name] MRN: [id number] Date of Birth: 1951/05/01   Medicare Important Message Given:  Yes    Nathen May 03/22/2016, 3:06 PM

## 2016-03-22 NOTE — Progress Notes (Signed)
PT Cancellation Note  Patient Details Name: [name] MRN: [id number] DOB: [birth year]   Cancelled Treatment:    Reason Eval/Treat Not Completed: PT screened, no needs identified, will sign off. Observed pt amb in hallways with cardiac rehab. Pt observed to be amb independently and easily. No signs of any balance problems with turns or avoiding obstacles. Gait speed at or above normal for age. No PT eval needed.   Lyndsi Altic 03/22/2016, 9:27 AM Suanne Marker PT [phone]

## 2016-03-22 NOTE — Progress Notes (Signed)
Occupational Therapy Evaluation Patient Details Name: Alexis [name] MRN: [id number] DOB: 12-15-1950 Today's Date: 03/22/2016    History of Present Illness 65 y.o. male s/p aortic valve replacement. PMH significant for HTN, chronic systolic CHF, severe aortic regurgitation, tricuspid regurgitation, CVA (10/2015), nephrolithiasis, seizure, dyspnea lying flat, GERD, hepatitis C, HA, and depression.   Clinical Impression   PTA, pt was independent with ADLs, IADLs and mobility. Pt is currently able to complete all ADLs and basic transfers with modified independence, however was not adhering to sternal precautions and demonstrates some impulsivity. Educated pt on all sternal precautions, some energy conservation strategies, and fall prevention strategies. Pt plans to d/c home with intermittent assistance from his friend, family, and neighbors. Pt will benefit from continued acute OT to increase independence and safety with ADLs and mobility to allow for safe discharge home. No OT follow up or DME recommended at this time. Will continue to follow acutely.    Follow Up Recommendations  No OT follow up;Supervision - Intermittent    Equipment Recommendations  None recommended by OT    Recommendations for Other Services       Precautions / Restrictions Precautions Precautions: Sternal      Mobility Bed Mobility               General bed mobility comments: Pt up in chair on OT arrival. Reviewed log roll technique to avoid putting unnecessary stress on sternum incision and to adhere to sternal precautions.  Transfers Overall transfer level: Modified independent Equipment used: None             General transfer comment: Educated pt on placing hands on knees to assist when standings. No physical assist required, no LOB noted or dizziness reported.    Balance Overall balance assessment: No apparent balance deficits (not formally assessed)                                           ADL Overall ADL's : Modified independent                                       General ADL Comments: Educated pt on sternal precautions (no pushing, pulling, reaching behind, putting hands on knees to stand, use heart pillow to cough or sneeze). Also reviewed fall prevention and home safety strategies with pt including using shower seat for first couple of times showering, taking frequent rest breaks during ADL/IADL tasks, having supervision from friend when going up/down stairs, taking it easy and having family provide assistance for more energy consuming homemaking tasks.     Vision Vision Assessment?: No apparent visual deficits   Perception     Praxis      Pertinent Vitals/Pain Pain Assessment: Faces Faces Pain Scale: Hurts a little bit Pain Location: incision site Pain Descriptors / Indicators: Sore Pain Intervention(s): Monitored during session     Hand Dominance Right   Extremity/Trunk Assessment Upper Extremity Assessment Upper Extremity Assessment: Overall WFL for tasks assessed   Lower Extremity Assessment Lower Extremity Assessment: Overall WFL for tasks assessed   Cervical / Trunk Assessment Cervical / Trunk Assessment: Normal   Communication Communication Communication: No difficulties   Cognition Arousal/Alertness: Awake/alert Behavior During Therapy: WFL for tasks assessed/performed Overall Cognitive Status: Within Functional Limits for tasks assessed  General Comments       Exercises       Shoulder Instructions      Home Living Family/patient expects to be discharged to:: Private residence Living Arrangements: Alone Available Help at Discharge: Family;Friend(s);Neighbor;Available PRN/intermittently Type of Home: Apartment Home Access: Stairs to enter Entrance Stairs-Number of Steps: 10 Entrance Stairs-Rails: Right;Left;Can reach both Home Layout: One level     Bathroom  Shower/Tub: Tub/shower unit Shower/tub characteristics: Architectural technologist: Standard     Home Equipment: None   Additional Comments: Has access to a shower chair from his neighbor      Prior Functioning/Environment Level of Independence: Independent        Comments: Drives    OT Diagnosis: Acute pain   OT Problem List: Decreased activity tolerance;Decreased safety awareness;Decreased knowledge of use of DME or AE;Pain   OT Treatment/Interventions: Self-care/ADL training;Therapeutic exercise;Energy conservation;DME and/or AE instruction;Therapeutic activities;Patient/family education;Balance training    OT Goals(Current goals can be found in the care plan section) Acute Rehab OT Goals Patient Stated Goal: to go home OT Goal Formulation: With patient Time For Goal Achievement: 04/05/16 Potential to Achieve Goals: Good ADL Goals Pt Will Perform Tub/Shower Transfer: Tub transfer;with modified independence;ambulating Additional ADL Goal #1: Pt will verbalize and demonstrate adherence to 3/3 sternal precautions with no verbal cues. Additional ADL Goal #2: Pt will verbalize 2 energy conservation strategies to increase safety with ADLs and IADLs.  OT Frequency: Min 2X/week   Barriers to D/C: Decreased caregiver support  Lives alone, but will have daily help from friend who was present for OT eval       Co-evaluation              End of Session Nurse Communication: Mobility status  Activity Tolerance: Patient tolerated treatment well Patient left: in chair;with call bell/phone within reach;with family/visitor present   Time: 1012-1025 OT Time Calculation (min): 13 min Charges:  OT General Charges $OT Visit: 1 Procedure OT Evaluation $OT Eval Moderate Complexity: 1 Procedure G-Codes:    Redmond Baseman, OTR/L PagerUD:[phone] 03/22/2016, 10:38 AM

## 2016-03-22 NOTE — Progress Notes (Signed)
CARDIAC REHAB PHASE I   PRE:  Rate/Rhythm: 68 SR  BP:  Supine:   Sitting: 140/100 manual,   161/106 dinamapp  Standing:    SaO2: 97%RA  MODE:  Ambulation: 890 ft   POST:  Rate/Rhythm: 87 SR  BP:  Supine:   Sitting: 170/110 manual  Standing:    SaO2: 97%RA 0909-1000 Pt walked 890 ft independently with steady gait. Can get up and down without difficulty. Pt stated he has people who can cook for him and check on him but not 24/7. He is going to talk to surgeon about going home instead of SNF since he is so steady. Completed pt's education re ex. Discussed sternal precautions, IS, heart healthy diet and ex ed. Discussed CRP 2 and will refer to [facility]. Wrote down how to view discharge video. Pt voiced understanding. BP is elevated and pt very concerned. Took manually to verify.   Graylon Good, RN BSN  03/22/2016 9:57 AM

## 2016-03-22 NOTE — Progress Notes (Addendum)
      [address]       [place], [address]             [phone]      4 Days Post-Op Procedure(s) (LRB): AORTIC VALVE REPLACEMENT (AVR) implanted magna ease 66mm (N/A) TRANSESOPHAGEAL ECHOCARDIOGRAM (TEE) (N/A)   Subjective:  Alexis [name] has no complaints.  He doesn't feel he needs to go to SNF, but he understands he can not be unattended.  Objective: Vital signs in last 24 hours: Temp:  [98.2 F (36.8 C)-98.5 F (36.9 C)] 98.5 F (36.9 C) (07/17 0523) Pulse Rate:  [66-78] 66 (07/17 0555) Cardiac Rhythm:  [-] Normal sinus rhythm (07/16 2020) Resp:  [16-18] 18 (07/17 0523) BP: (143-185)/(87-112) 162/100 mmHg (07/17 0603) SpO2:  [97 %-99 %] 99 % (07/17 0523) Weight:  [112 lb 9.6 oz (51.075 kg)] 112 lb 9.6 oz (51.075 kg) (07/17 0523)  Intake/Output from previous day: 07/16 0701 - 07/17 0700 In: 123 [P.O.:120; I.V.:3] Out: -   General appearance: alert, cooperative and no distress Heart: regular rate and rhythm Lungs: clear to auscultation bilaterally Abdomen: soft, non-tender; bowel sounds normal; no masses,  no organomegaly Extremities: edema no edema present Wound: clean and dry  Lab Results:  Recent Labs  03/20/16 0300 03/21/16 0240  WBC 11.5* 11.5*  HGB 6.7* 9.6*  HCT 19.6* 28.9*  PLT 110* 116*   BMET:  Recent Labs  03/20/16 0300 03/21/16 0618  NA 133* 134*  K 4.2 3.8  CL 101 100*  CO2 27 26  GLUCOSE 152* 115*  BUN 12 15  CREATININE 0.71 0.60*  CALCIUM 8.0* 8.6*    PT/INR: No results for input(s): LABPROT, INR in the last 72 hours. ABG    Component Value Date/Time   PHART 7.399 03/18/2016 2135   HCO3 23.2 03/18/2016 2135   TCO2 25 03/19/2016 1721   ACIDBASEDEF 1.0 03/18/2016 2135   O2SAT 98.0 03/18/2016 2135   CBG (last 3)   Recent Labs  03/19/16 2340 03/20/16 0343 03/20/16 0834  GLUCAP 140* 159* 129*    Assessment/Plan: S/P Procedure(s) (LRB): AORTIC VALVE REPLACEMENT (AVR) implanted magna ease 77mm  (N/A) TRANSESOPHAGEAL ECHOCARDIOGRAM (TEE) (N/A)  1. CV- NSR, + Hypertension- will increase Norvasc to 10 mg daily, continue Lisinopril, Coreg 2. Pulm- no acute issues, continue IS 3. Renal- weight is below admission, he is on home dose of Lasix 4. Deconditioning- needs SNF as lives alone, with no help at discharge 5. Dispo- patient stable, ready for SNF today vs. Tomorrow AM... Will defer to Dr. [name]    LOS: 4 days    Alexis [name] 03/22/2016   I have seen and examined the patient and agree with the assessment and plan as outlined.  Not ready for d/c until BP under better control which hopefully will be by tomorrow  Alexis Alberts, MD 03/22/2016 9:12 AM

## 2016-03-23 LAB — AEROBIC/ANAEROBIC CULTURE W GRAM STAIN (SURGICAL/DEEP WOUND)
Culture: NO GROWTH
Culture: NO GROWTH

## 2016-03-23 LAB — AEROBIC/ANAEROBIC CULTURE (SURGICAL/DEEP WOUND)

## 2016-03-23 MED ORDER — AMLODIPINE BESYLATE 10 MG PO TABS: 10.0000 mg | ORAL_TABLET | Freq: Every day | ORAL | Status: AC

## 2016-03-23 MED ORDER — OXYCODONE HCL 5 MG PO TABS: ORAL_TABLET | ORAL | Status: DC

## 2016-03-23 MED ORDER — CARVEDILOL 6.25 MG PO TABS: 6.2500 mg | ORAL_TABLET | Freq: Two times a day (BID) | ORAL | Status: DC

## 2016-03-23 NOTE — Clinical Social Work Note (Addendum)
CSW informed by RN staff that patient will return home. CSW signing off.   [employer], LCSW [phone]

## 2016-03-23 NOTE — Progress Notes (Addendum)
[address]       [place] [address]             [phone]      5 Days Post-Op Procedure(s) (LRB): AORTIC VALVE REPLACEMENT (AVR) implanted magna ease 72mm (N/A) TRANSESOPHAGEAL ECHOCARDIOGRAM (TEE) (N/A)   Subjective:  Alexis [name] has no complaints.  States he feels great.  He continues to state he does not wish to go to SNF.  He states he is fine on his feet and if he needs help there are people who can provide assistance.  Wants to go home.  Objective: Vital signs in last 24 hours: Temp:  [98.1 F (36.7 C)-98.6 F (37 C)] 98.1 F (36.7 C) (07/18 0430) Pulse Rate:  [69-82] 69 (07/18 0430) Cardiac Rhythm:  [-] Normal sinus rhythm (07/17 1900) Resp:  [18-19] 18 (07/18 0430) BP: (140-168)/(91-98) 147/98 mmHg (07/18 0430) SpO2:  [95 %-99 %] 98 % (07/18 0430) Weight:  [112 lb 11.2 oz (51.12 kg)] 112 lb 11.2 oz (51.12 kg) (07/18 0430)  Intake/Output from previous day: 07/17 0701 - 07/18 0700 In: 120 [P.O.:120] Out: 200 [Urine:200]  General appearance: alert, cooperative and no distress Heart: regular rate and rhythm Lungs: clear to auscultation bilaterally Abdomen: soft, non-tender; bowel sounds normal; no masses,  no organomegaly Extremities: edema none present Wound: clean and dry  Lab Results:  Recent Labs  03/21/16 0240  WBC 11.5*  HGB 9.6*  HCT 28.9*  PLT 116*   BMET:  Recent Labs  03/21/16 0618  NA 134*  K 3.8  CL 100*  CO2 26  GLUCOSE 115*  BUN 15  CREATININE 0.60*  CALCIUM 8.6*    PT/INR: No results for input(s): LABPROT, INR in the last 72 hours. ABG    Component Value Date/Time   PHART 7.399 03/18/2016 2135   HCO3 23.2 03/18/2016 2135   TCO2 25 03/19/2016 1721   ACIDBASEDEF 1.0 03/18/2016 2135   O2SAT 98.0 03/18/2016 2135   CBG (last 3)   Recent Labs  03/20/16 0834  GLUCAP 129*    Assessment/Plan: S/P Procedure(s) (LRB): AORTIC VALVE REPLACEMENT (AVR) implanted magna ease 71mm (N/A) TRANSESOPHAGEAL  ECHOCARDIOGRAM (TEE) (N/A)  1. CV- NSR, remains hypertensive- he is on max dose of Norvasc, and Lisionpril, HR is too low to increase Coreg, will discuss possibly adding Hydralazine for additional BP support 2. Pulm- no acute issues, off oxygen, continue IS 3. Renal- weight is below baseline, no Le edema... ? Stop Lasix 4. Dispo- patient stable, per patient does not wish to go to SNF, social work has not spoken with patient, PT canceled eval as patient was walking steadily in hallways without assistance, BP remains elevated.. Will discuss possibly adding hydralazine with Alexis. [name]   LOS: 5 days    [name], [name] 03/23/2016  I have seen and examined the patient and agree with the assessment and plan as outlined.  I think BP is under adequate control for now and do not favor adding a 4th agent.  Will discuss w/ Alexis [name] who will be following patient long term.  Alexis Alberts, MD 03/23/2016 2:13 PM   ADDENDUM:  Patient tells me that he has made arrangements for someone to stay with him 24 hrs/day.  He will be here in [place] until Thursday when he will go to his sister's home in [place] where he will stay at least through next weekend.  Will d/c home.  Discussed BP meds with Alexis. [name] who agrees  w/ plan and will arrange for f/u in Cardiology office.  Alexis Alberts, MD 03/23/2016 5:10 PM

## 2016-03-23 NOTE — Progress Notes (Signed)
Occupational Therapy Treatment/Discharge Patient Details Name: Alexis [name] MRN: [id number] DOB: 09-Jan-1951 Today's Date: 03/23/2016    History of present illness 65 y.o. male s/p aortic valve replacement. PMH significant for HTN, chronic systolic CHF, severe aortic regurgitation, tricuspid regurgitation, CVA (10/2015), nephrolithiasis, seizure, dyspnea lying flat, GERD, hepatitis C, HA, and depression.   OT comments  Pt able to recall all sternal precautions without VCs and completed all ADLs and basic transfers with modified independence. Continue to feel pt is safe to d/c home with frequent intermittent assistance from various family, friends and neighbors. Pt is adequate for discharge from Occupational Therapy standpoint. Pt with no further acute OT needs. OT signing off.   Follow Up Recommendations  No OT follow up;Supervision - Intermittent    Equipment Recommendations  None recommended by OT    Recommendations for Other Services      Precautions / Restrictions Precautions Precautions: Sternal       Mobility Bed Mobility               General bed mobility comments: Pt up in chair on OT arrival. Reviewed log roll technique to avoid putting unnecessary stress on sternum incision and to adhere to sternal precautions.  Transfers Overall transfer level: Modified independent Equipment used: None                  Balance Overall balance assessment: No apparent balance deficits (not formally assessed)                                 ADL Overall ADL's : Modified independent                                       General ADL Comments: Reviewed sternal precautions for IADL tasks. Pt completed tub transfer with modified independence.       Vision                     Perception     Praxis      Cognition   Behavior During Therapy: WFL for tasks assessed/performed Overall Cognitive Status: Within Functional Limits for  tasks assessed                       Extremity/Trunk Assessment               Exercises     Shoulder Instructions       General Comments      Pertinent Vitals/ Pain       Pain Assessment: No/denies pain  Home Living                                          Prior Functioning/Environment              Frequency       Progress Toward Goals  OT Goals(current goals can now be found in the care plan section)  Progress towards OT goals: Goals met/education completed, patient discharged from OT  Acute Rehab OT Goals Patient Stated Goal: to go home OT Goal Formulation: With patient Time For Goal Achievement: 04/05/16 Potential to Achieve Goals: Good ADL Goals Pt Will Perform Tub/Shower Transfer: Tub transfer;with modified independence;ambulating Additional ADL  Goal #1: Pt will verbalize and demonstrate adherence to 3/3 sternal precautions with no verbal cues. Additional ADL Goal #2: Pt will verbalize 2 energy conservation strategies to increase safety with ADLs and IADLs.  Plan All goals met and education completed, patient discharged from OT services    Co-evaluation                 End of Session     Activity Tolerance Patient tolerated treatment well   Patient Left in chair;with call bell/phone within reach;with family/visitor present   Nurse Communication Mobility status        Time: 1132-1140 OT Time Calculation (min): 8 min  Charges: OT General Charges $OT Visit: 1 Procedure OT Treatments $Self Care/Home Management : 8-22 mins  Redmond Baseman, OTR/L Pager: [phone] 03/23/2016, 1:02 PM

## 2016-03-23 NOTE — Progress Notes (Signed)
CARDIAC REHAB PHASE I   PRE:  Rate/Rhythm: 93 SR  BP:  Supine:   Sitting: 140/86  Standing:    SaO2: 99%RA  MODE:  Ambulation: 1100 ft   POST:  Rate/Rhythm: 87 SR  BP:  Supine:   Sitting: 140/90  Standing:    SaO2: 97%RA 1118-1140 Pt walked 1100 ft independently with steady gait. Pt adhered to sternal precautions when standing. BP better today and pt feeling even stronger. Pt really wants to go home as he has several people who can check on him, but no one 24/7.   Graylon Good, RN BSN  03/23/2016 11:35 AM

## 2016-03-29 ENCOUNTER — Other Ambulatory Visit: Payer: Self-pay | Admitting: *Deleted

## 2016-03-29 DIAGNOSIS — G8918 Other acute postprocedural pain: Secondary | ICD-10-CM

## 2016-03-29 MED ORDER — OXYCODONE HCL 5 MG PO TABS: ORAL_TABLET | ORAL | 0 refills | Status: DC

## 2016-04-05 ENCOUNTER — Other Ambulatory Visit: Payer: Self-pay | Admitting: *Deleted

## 2016-04-05 ENCOUNTER — Ambulatory Visit (INDEPENDENT_AMBULATORY_CARE_PROVIDER_SITE_OTHER): Payer: Medicare HMO | Admitting: Cardiovascular Disease

## 2016-04-05 ENCOUNTER — Encounter: Payer: Self-pay | Admitting: Cardiovascular Disease

## 2016-04-05 ENCOUNTER — Telehealth: Payer: Self-pay | Admitting: *Deleted

## 2016-04-05 VITALS — BP 180/113 | HR 79 | Ht 64.0 in | Wt 120.4 lb

## 2016-04-05 DIAGNOSIS — I272 Other secondary pulmonary hypertension: Secondary | ICD-10-CM

## 2016-04-05 DIAGNOSIS — Z952 Presence of prosthetic heart valve: Secondary | ICD-10-CM

## 2016-04-05 DIAGNOSIS — Z954 Presence of other heart-valve replacement: Secondary | ICD-10-CM | POA: Diagnosis not present

## 2016-04-05 DIAGNOSIS — I10 Essential (primary) hypertension: Secondary | ICD-10-CM

## 2016-04-05 DIAGNOSIS — G8918 Other acute postprocedural pain: Secondary | ICD-10-CM

## 2016-04-05 MED ORDER — CARVEDILOL 25 MG PO TABS: 25.0000 mg | ORAL_TABLET | Freq: Two times a day (BID) | ORAL | 5 refills | Status: AC

## 2016-04-05 MED ORDER — OXYCODONE HCL 5 MG PO TABS: ORAL_TABLET | ORAL | 0 refills | Status: DC

## 2016-04-05 NOTE — Patient Instructions (Signed)
Medication Instructions:  INCREASE YOUR CARVEDILOL TO 25 MG TWICE A DAY  Labwork: NONE  Testing/Procedures: Your physician has requested that you have an echocardiogram. Echocardiography is a painless test that uses sound waves to create images of your heart. It provides your doctor with information about the size and shape of your heart and how well your heart's chambers and valves are working. This procedure takes approximately one hour. There are no restrictions for this procedure. [address]  Follow-Up: Your physician recommends that you schedule a follow-up appointment in: [place]  If you need a refill on your cardiac medications before your next appointment, please call your pharmacy.

## 2016-04-05 NOTE — Progress Notes (Signed)
Cardiology Office Note   Date:  04/05/2016   ID:  [name], DOB [birth year], MRN [id number]  PCP:  [name], MD  Cardiologist:   [name], MD  Cardiothoracic Surgeon: Dr. [name]  Chief Complaint  Patient presents with  . Follow-up    chest pain; tightness.       History of Present Illness: Alexis [name] is a 65 y.o. male with chronic Hepatitis C, seizures, CVA,  Chronic systolic and diastolic heart failure, hypertension, severe aortic regurgitaiton s/p bioprosthetic aortic valve replacement,  who presents for follow up after aortic valve replacement.  Alexis [name] was admitted 10/15/15 with shortness of breath and a 35 pound weight loss in 3 months.  Prior to that he had been told that he had a heart murmur but did not know of any heart conditions.  He was found to be in acute heart failure with a BNP of 2457.  Echo revealed LVEF 40-45% with severe aortic regurgitaiton and  moderate mitral and tricuspid regurgitation.  PASP was 81 mmHg.  Cardiology was not consulted.  He was treated with antibiotics, started on an ACE-I and diuresed with improvement in his respiratory status.  During that hospitalization he had speech difficutlties and was found to have a small acute infarct in the R frontal lobe.  He was readmitted 02/05/16 with heart failure and BNP >4500.  Echo that admission revealed severe AR with LVEF 35-40%.  He was seen by cardiology and diuresed.  LHC showed a 90% lesion of a small OM3 but otherwise no significant obstruction.    Alexis [name] had a 27 mm [facility] Ease pericardial aortic valve replacement by Dr. [name] on 03/18/16.  He required post operative transfusion.  He was discharged to a SNF and was subsequently discharged home.  He has done well post-operatively.  He gained 8 lb in the last two weeks.  He has been walking 0.25 miles twice daily but is afraid to walk longer.  He thinks that his legs will give out.  He denies chest pain or shortness of breath with  exertion.  He also denies lower extremity edema, orthopnea or PND.  His main complaint is post-op sternal pain and an irritable mood.  He has family members staying with him 24/7 and he is ready to be alone again.  Alexis [name] is a service connected veteran who gets his primary care at the [place].   Past Medical History:  Diagnosis Date  . Chronic systolic CHF (congestive heart failure) ([place])   . Depression   . GERD (gastroesophageal reflux disease)   . Headache   . Hepatitis C   . Hypertension   . Mitral regurgitation   . Nephrolithiasis   . Protein calorie malnutrition ([place])   . S/P aortic valve replacement with bioprosthetic valve 03/18/2016   27 mm [facility] Ease bovine pericardial tissue valve  . Seizure ([place])   . Severe aortic regurgitation   . Shortness of breath dyspnea    LYING FLAT   . Stroke (cerebrum) ([place]) 10/2015  . Tricuspid regurgitation     Past Surgical History:  Procedure Laterality Date  . AORTIC VALVE REPLACEMENT N/A 03/18/2016   Procedure: AORTIC VALVE REPLACEMENT (AVR) implanted magna ease 69mm;  Surgeon: [name], MD;  Location: [place];  Service: Open Heart Surgery;  Laterality: N/A;  . CARDIAC CATHETERIZATION N/A 02/09/2016   Procedure: Right/Left Heart Cath and Coronary Angiography;  Surgeon: [name], MD;  Location: [place]  CV LAB;  Service: Cardiovascular;  Laterality: N/A;  . FRACTURE SURGERY     RT ARM  . LEGS     BIL FX   . LITHOTRIPSY    . MULTIPLE EXTRACTIONS WITH ALVEOLOPLASTY N/A 03/01/2016   Procedure: Extraction of tooth #'s [id number] with alveoloplasty and gross debridement of remaining teeth.;  Surgeon: [name], DDS;  Location: [place];  Service: Oral Surgery;  Laterality: N/A;  . ROTATOR CUFF REPAIR    . TEE WITHOUT CARDIOVERSION N/A 02/11/2016   Procedure: TRANSESOPHAGEAL ECHOCARDIOGRAM (TEE);  Surgeon: [name], MD;  Location: [place];  Service: Cardiovascular;  Laterality: N/A;  . TEE WITHOUT  CARDIOVERSION N/A 03/18/2016   Procedure: TRANSESOPHAGEAL ECHOCARDIOGRAM (TEE);  Surgeon: [name], MD;  Location: [place];  Service: Open Heart Surgery;  Laterality: N/A;     Current Outpatient Prescriptions  Medication Sig Dispense Refill  . amLODipine (NORVASC) 10 MG tablet Take 1 tablet (10 mg total) by mouth daily. 30 tablet 1  . aspirin EC 325 MG EC tablet Take 1 tablet (325 mg total) by mouth daily. 30 tablet 0  . atorvastatin (LIPITOR) 10 MG tablet Take 1 tablet (10 mg total) by mouth daily at 6 PM. 30 tablet 0  . carvedilol (COREG) 25 MG tablet Take 1 tablet (25 mg total) by mouth 2 (two) times daily. 60 tablet 5  . chlorhexidine (PERIDEX) 0.12 % solution Rinse with 15 mls twice daily for 30 seconds. Use after breakfast and at bedtime. Spit out excess. Do not swallow. 480 mL prn  . feeding supplement, ENSURE ENLIVE, (ENSURE ENLIVE) LIQD Take 237 mLs by mouth 3 (three) times daily between meals. 90 Bottle 0  . lisinopril (PRINIVIL,ZESTRIL) 40 MG tablet Take 1 tablet (40 mg total) by mouth daily. 30 tablet 0  . oxyCODONE (OXY IR/ROXICODONE) 5 MG immediate release tablet One tablet by mouth every 4-6 hours PRN pain 30 tablet 0   No current facility-administered medications for this visit.     Allergies:   No known allergies    Social History:  The patient  reports that he quit smoking about 44 years ago. His smoking use included Cigarettes. He has a 3.00 pack-year smoking history. He has quit using smokeless tobacco. His smokeless tobacco use included Chew. He reports that he drinks alcohol. He reports that he uses drugs, including Marijuana.   Family History:  The patient's family history includes Aneurysm in his mother; Breast cancer in his mother; Cancer in his father; Hypertension in his brother and father.    ROS:  Please see the history of present illness.   Otherwise, review of systems are positive for none.   All other systems are reviewed and negative.    PHYSICAL  EXAM: VS:  BP (!) 180/113   Pulse 79   Ht 5\' 4"  (1.626 m)   Wt 120 lb 6.4 oz (54.6 kg)   BMI 20.67 kg/m  , BMI Body mass index is 20.67 kg/m. GENERAL:  Well appearing HEENT:  Pupils equal round and reactive, fundi not visualized, oral mucosa unremarkable NECK:  No jugular venous distention, waveform within normal limits, carotid upstroke brisk and symmetric, no bruits, no thyromegaly LYMPHATICS:  No cervical adenopathy LUNGS:  Clear to auscultation bilaterally HEART:  RRR.  PMI not displaced or sustained,S1 and S2 within normal limits, no S3, no S4, no clicks, no rubs, no murmurs ABD:  Flat, positive bowel sounds normal in frequency in pitch, no bruits, no rebound, no guarding,  no midline pulsatile mass, no hepatomegaly, no splenomegaly EXT:  2 plus pulses throughout, no edema, no cyanosis no clubbing SKIN:  No rashes no nodules NEURO:  Cranial nerves II through XII grossly intact, motor grossly intact throughout PSYCH:  Cognitively intact, oriented to person place and time    EKG:  EKG is ordered today. The ekg ordered today demonstrates sinus rhythm rate 79 bpm.  R axis deviation.  Poor R wave progression.     Recent Labs: 10/17/2015: TSH 1.828 02/05/2016: B Natriuretic Peptide >4,500.0 03/16/2016: ALT 21 03/19/2016: Magnesium 2.2 03/21/2016: BUN 15; Creatinine, Ser 0.60; Hemoglobin 9.6; Platelets 116; Potassium 3.8; Sodium 134    Lipid Panel    Component Value Date/Time   CHOL 159 10/16/2015 0235   TRIG 41 10/16/2015 0235   HDL 49 10/16/2015 0235   CHOLHDL 3.2 10/16/2015 0235   VLDL 8 10/16/2015 0235   LDLCALC 102 (H) 10/16/2015 0235      Wt Readings from Last 3 Encounters:  04/05/16 120 lb 6.4 oz (54.6 kg)  03/23/16 112 lb 11.2 oz (51.1 kg)  03/16/16 119 lb (54 kg)      ASSESSMENT AND PLAN:  # s/p 27 mm [facility] Magna Ease pericardial AVR: Alexis [name] is doing very well medically after his AVR. His mood seems a little depressed and he reports irritability.  I  recommended that he consider seeing a psychiatrist and he will follow up with his PCP.  We will check an echo.  Valve seems stable and he has no evidence of heart failure.  He is eager to drive but I recommended to wait until his follow-up appointment with Dr. [name] for clearance. He also requested oxycodone refills. I encouraged him to call Dr. Ayesha Rumpf office, as I would prefer that only one provider prescribed narcotics. He expressed understanding. I did recommend that he take Tylenol as needed for pain.  # Hypertension: BP is poorly-controlled.  We will increase carvedilol to 25 mg daily.  Continue amlodipine and lisinopril.  He will monitor his BP at home and call if it is >140/90.  # Pulmonary hypertension:  Preoperative echo revealed severe pulmonary hypertension. He does not appear to have heart failure on exam today. We will obtain a repeat echo as above.  Current medicines are reviewed at length with the patient today.  The patient does not have concerns regarding medicines.  The following changes have been made:  Increase carvedilol to 25 mg bid.   Labs/ tests ordered today include:   Orders Placed This Encounter  Procedures  . EKG 12-Lead  . ECHOCARDIOGRAM COMPLETE     Disposition:   FU with Rafel Garde C. Oval Linsey, MD, [facility] in 1 month    This note was written with the assistance of speech recognition software.  Please excuse any transcriptional errors.  Signed, Laurena Valko C. Oval Linsey, MD, [facility]  04/05/2016 6:11 PM    [place]

## 2016-04-05 NOTE — Telephone Encounter (Signed)
Alexis [name] has called for a refill for his Oxycodone s/p AVR 03/18/16, his second refill since discharge.  I informed him that a new signed script would be available today at our office and he agreed.

## 2016-04-15 ENCOUNTER — Other Ambulatory Visit: Payer: Self-pay | Admitting: Thoracic Surgery (Cardiothoracic Vascular Surgery)

## 2016-04-15 DIAGNOSIS — Z952 Presence of prosthetic heart valve: Secondary | ICD-10-CM

## 2016-04-16 ENCOUNTER — Other Ambulatory Visit: Payer: Self-pay

## 2016-04-16 ENCOUNTER — Ambulatory Visit (HOSPITAL_COMMUNITY): Payer: Medicare HMO | Attending: Cardiology

## 2016-04-16 DIAGNOSIS — Z953 Presence of xenogenic heart valve: Secondary | ICD-10-CM | POA: Insufficient documentation

## 2016-04-16 DIAGNOSIS — I509 Heart failure, unspecified: Secondary | ICD-10-CM | POA: Diagnosis not present

## 2016-04-16 DIAGNOSIS — Z954 Presence of other heart-valve replacement: Secondary | ICD-10-CM | POA: Diagnosis not present

## 2016-04-16 DIAGNOSIS — Z952 Presence of prosthetic heart valve: Secondary | ICD-10-CM

## 2016-04-16 DIAGNOSIS — Z87891 Personal history of nicotine dependence: Secondary | ICD-10-CM | POA: Diagnosis not present

## 2016-04-16 DIAGNOSIS — I350 Nonrheumatic aortic (valve) stenosis: Secondary | ICD-10-CM | POA: Diagnosis present

## 2016-04-16 DIAGNOSIS — I110 Hypertensive heart disease with heart failure: Secondary | ICD-10-CM | POA: Diagnosis not present

## 2016-04-16 LAB — ECHOCARDIOGRAM COMPLETE
AOASC: 38 cm
AOPV: 0.52 m/s
AOVTI: 44.9 cm
AVCELMEANRAT: 0.47
AVG: 12 mmHg
AVPG: 20 mmHg
AVPKVEL: 223 cm/s
DOP CAL AO MEAN VELOCITY: 161 cm/s
E decel time: 303 msec
EERAT: 6.81
FS: 22 % — AB (ref 28–44)
IVS/LV PW RATIO, ED: 0.98
LA vol index: 40.4 mL/m2
LA vol: 63.8 mL
LADIAMINDEX: 2.59 cm/m2
LASIZE: 41 mm
LAVOLA4C: 44.7 mL
LEFT ATRIUM END SYS DIAM: 41 mm
LV E/e' medial: 6.81
LV E/e'average: 6.81
LV TDI E'LATERAL: 5.77
LV TDI E'MEDIAL: 4.68
LV e' LATERAL: 5.77 cm/s
LVOT VTI: 22 cm
LVOT peak VTI: 0.49 cm
LVOT peak grad rest: 5 mmHg
LVOT peak vel: 117 cm/s
MV Dec: 303
MV pk A vel: 58.7 m/s
MVPKEVEL: 39.3 m/s
PW: 15.2 mm — AB (ref 0.6–1.1)
RV sys press: 32 mmHg
Reg peak vel: 267 cm/s
TAPSE: 16.6 mm
TR max vel: 267 cm/s

## 2016-04-19 ENCOUNTER — Encounter: Payer: Self-pay | Admitting: Thoracic Surgery (Cardiothoracic Vascular Surgery)

## 2016-04-19 ENCOUNTER — Ambulatory Visit (INDEPENDENT_AMBULATORY_CARE_PROVIDER_SITE_OTHER): Payer: Self-pay | Admitting: Thoracic Surgery (Cardiothoracic Vascular Surgery)

## 2016-04-19 ENCOUNTER — Ambulatory Visit
Admission: RE | Admit: 2016-04-19 | Discharge: 2016-04-19 | Disposition: A | Discharge status: Home / Self Care | Payer: Medicare HMO | Source: Ambulatory Visit | Attending: Thoracic Surgery (Cardiothoracic Vascular Surgery) | Admitting: Thoracic Surgery (Cardiothoracic Vascular Surgery)

## 2016-04-19 VITALS — BP 152/100 | HR 60 | Resp 20 | Ht 64.0 in | Wt 120.0 lb

## 2016-04-19 DIAGNOSIS — Z952 Presence of prosthetic heart valve: Secondary | ICD-10-CM

## 2016-04-19 DIAGNOSIS — Z953 Presence of xenogenic heart valve: Secondary | ICD-10-CM

## 2016-04-19 DIAGNOSIS — Z954 Presence of other heart-valve replacement: Secondary | ICD-10-CM

## 2016-04-19 DIAGNOSIS — G8918 Other acute postprocedural pain: Secondary | ICD-10-CM

## 2016-04-19 DIAGNOSIS — I351 Nonrheumatic aortic (valve) insufficiency: Secondary | ICD-10-CM

## 2016-04-19 MED ORDER — OXYCODONE HCL 5 MG PO TABS: ORAL_TABLET | ORAL | 0 refills | Status: DC

## 2016-04-19 MED ORDER — LISINOPRIL 40 MG PO TABS: 40.0000 mg | ORAL_TABLET | Freq: Two times a day (BID) | ORAL | 0 refills | Status: DC

## 2016-04-19 NOTE — Patient Instructions (Signed)
Continue to avoid any heavy lifting or strenuous use of your arms or shoulders for at least a total of three months from the time of surgery.  After three months you may gradually increase how much you lift or otherwise use your arms or chest as tolerated, with limits based upon whether or not activities lead to the return of significant discomfort.  You are encouraged to enroll and participate in the outpatient cardiac rehab program beginning as soon as practical.  You may return to driving an automobile as long as you are no longer requiring oral narcotic pain relievers during the daytime.  It would be wise to start driving only short distances during the daylight and gradually increase from there as you feel comfortable.  Increase your dose of lisinopril to 40 mg by mouth twice daily.  Monitor your blood pressure regularly.

## 2016-04-19 NOTE — Progress Notes (Signed)
[name]       [place] [address]             [phone]     CARDIOTHORACIC SURGERY OFFICE NOTE  Referring Provider is Alexis Latch, MD PCP is Alexis Farm, MD   HPI:  Patient returns to the office today for routine follow-up status post aortic valve replacement using a bioprosthetic tissue valve for severe symptomatic aortic insufficiency on 03/18/2016.  His early postoperative recovery was uneventful although notable for the presence of significant hypertension that required multiple drugs for medical control. He was ultimately discharged from the hospital on the fifth postoperative day using carvedilol, Norvasc, and lisinopril for blood pressure control.  Since hospital discharge the patient has done well. He was seen in follow-up by Dr. [name] on 04/05/2016. His dose of carvedilol was increased at that time. He returns to our office today for follow-up and reports that he is doing very well. Over the last few weeks he has made excellent progress. He states that his breathing is already much better than it was prior to surgery. He is walking every day for at least half a mile. He is not smoking cigarettes. He still has some soreness in his chest but this bothers him primarily only at night when he is sleeping.  Appetite is good. Overall he is pleased with his progress.   Current Outpatient Prescriptions  Medication Sig Dispense Refill  . amLODipine (NORVASC) 10 MG tablet Take 1 tablet (10 mg total) by mouth daily. 30 tablet 1  . aspirin EC 325 MG EC tablet Take 1 tablet (325 mg total) by mouth daily. 30 tablet 0  . atorvastatin (LIPITOR) 10 MG tablet Take 1 tablet (10 mg total) by mouth daily at 6 PM. 30 tablet 0  . carvedilol (COREG) 25 MG tablet Take 1 tablet (25 mg total) by mouth 2 (two) times daily. 60 tablet 5  . chlorhexidine (PERIDEX) 0.12 % solution Rinse with 15 mls twice daily for 30 seconds. Use after breakfast and at bedtime. Spit out  excess. Do not swallow. 480 mL prn  . feeding supplement, ENSURE ENLIVE, (ENSURE ENLIVE) LIQD Take 237 mLs by mouth 3 (three) times daily between meals. 90 Bottle 0  . lisinopril (PRINIVIL,ZESTRIL) 40 MG tablet Take 1 tablet (40 mg total) by mouth daily. 30 tablet 0  . oxyCODONE (OXY IR/ROXICODONE) 5 MG immediate release tablet One tablet by mouth every 4-6 hours PRN pain 30 tablet 0   No current facility-administered medications for this visit.       Physical Exam:   BP (!) 152/100 (BP Location: Left Leg, Cuff Size: Small)   Pulse 60   Resp 20   Ht 5\' 4"  (1.626 m)   Wt 120 lb (54.4 kg)   SpO2 99% Comment: RA  BMI 20.60 kg/m   General:  Well-appearing  Chest:   Clear to auscultation with symmetrical breath sounds  CV:   Regular rate and rhythm without murmur  Incisions:  Healing nicely, sternum is stable  Abdomen:  Soft and nontender  Extremities:  Warm and well-perfused, no edema  Diagnostic Tests:  Echocardiography  Patient:    Alexis [name] MR #:       [id number] Study Date: 04/16/2016 Gender:     M Age:        65 Height:     162.6 cm Weight:     54.6 kg BSA:        1.57  m^2 Pt. Status: Room:   ATTENDING    Fransico Him, MD  SONOGRAPHER  Victorio Palm, RDCS  PERFORMING   [facility]  ORDERING     Alexis Latch, MD  [place], MD  cc:  ------------------------------------------------------------------- LV EF: 50% -   55%  ------------------------------------------------------------------- Indications:      (I35.0).  (z95.4).  ------------------------------------------------------------------- History:   PMH:  Acquired from the patient and from the patient&'s chart.  Congestive heart failure, with an ejection fraction of 40%by echocardiography. The dysfunction is both systolic and diastolic.  Borderline significant aortic regurgitation.  Mild mitral regurgitation.  Stroke.  Risk factors:  Former tobacco  use. Hypertension.  ------------------------------------------------------------------- Study Conclusions  - Left ventricle: The cavity size was normal. There was moderate   concentric hypertrophy. Systolic function was normal. The   estimated ejection fraction was in the range of 50% to 55%. Wall   motion was normal; there were no regional wall motion   abnormalities. There was an increased relative contribution of   atrial contraction to ventricular filling. Doppler parameters are   consistent with abnormal left ventricular relaxation (grade 1   diastolic dysfunction). - Aortic valve: A pericardial bioprosthesis was present. Mean   gradient (S): 15 mm Hg. Peak gradient (S): 21 mm Hg. - Aorta: Ascending aortic diameter: 38 mm (S). - Left atrium: The atrium was mildly dilated. - Right atrium: The atrium was moderately dilated. - Atrial septum: There was increased thickness of the septum,   consistent with lipomatous hypertrophy. - Pulmonary arteries: PA peak pressure: 32 mm Hg (S).  ------------------------------------------------------------------- Labs, prior tests, procedures, and surgery: Echocardiography (June 2017).    The aortic valve showed severe regurgitation. The mitral valve showed mild regurgitation.  EF was 40% and PA pressure was 45 (systolic).  Valve surgery (July 2017).    Aortic valve replacement with a 27 mm Magna Ease pericardial valve.  ------------------------------------------------------------------- Study data:  Comparison was made to the study of June 2017.  Study status:  Routine.  Procedure:  The patient reported no pain pre or post test. Transthoracic echocardiography for left ventricular function evaluation, for right ventricular function evaluation, for assessment of valvular function, and for evaluation of pulmonary pressures. Image quality was adequate.  Study completion:  There were no complications.          Echocardiography.  M-mode,  complete 2D, 3D, spectral Doppler, and color Doppler.  Birthdate:  Patient birthdate: Aug 17, 1951.  Age:  Patient is 65 yr old.  Sex:  Gender: male.    BMI: 20.7 kg/m^2.  Blood pressure:     180/92  Patient status:  Outpatient.  Study date:  Study date: 04/16/2016. Study time: 09:47 AM.  Location:  [facility]  -------------------------------------------------------------------  ------------------------------------------------------------------- Left ventricle:  The cavity size was normal. There was moderate concentric hypertrophy. Systolic function was normal. The estimated ejection fraction was in the range of 50% to 55%. Wall motion was normal; there were no regional wall motion abnormalities. There was an increased relative contribution of atrial contraction to ventricular filling. Doppler parameters are consistent with abnormal left ventricular relaxation (grade 1 diastolic dysfunction).  ------------------------------------------------------------------- Aortic valve:   Normal thickness leaflets. A pericardial bioprosthesis was present. Mobility was not restricted.  Doppler: Transvalvular velocity was within the normal range. There was no stenosis. There was no regurgitation. There was no significant perivalvular regurgitation.    VTI ratio of LVOT to aortic valve: 0.47. Peak velocity ratio of LVOT to aortic  valve: 0.51. Mean velocity ratio of LVOT to aortic valve: 0.41.    Mean gradient (S): 15 mm Hg. Peak gradient (S): 21 mm Hg.  ------------------------------------------------------------------- Aorta:  Aortic root: The aortic root was normal in size. Ascending aorta: The ascending aorta was mildly dilated.  ------------------------------------------------------------------- Mitral valve:   Structurally normal valve.   Mobility was not restricted.  Doppler:  Transvalvular velocity was within the normal range. There was no evidence for stenosis. There was  trivial regurgitation.  ------------------------------------------------------------------- Left atrium:  The atrium was mildly dilated.  ------------------------------------------------------------------- Atrial septum:  There was increased thickness of the septum, consistent with lipomatous hypertrophy.  ------------------------------------------------------------------- Right ventricle:  The cavity size was normal. Wall thickness was normal. Systolic function was normal.  ------------------------------------------------------------------- Pulmonic valve:    Structurally normal valve.   Cusp separation was normal.  Doppler:  Transvalvular velocity was within the normal range. There was no evidence for stenosis. There was no regurgitation.  ------------------------------------------------------------------- Tricuspid valve:   Structurally normal valve.   Leaflet separation was normal.  Doppler:  Transvalvular velocity was within the normal range. There was mild regurgitation.  ------------------------------------------------------------------- Pulmonary artery:   The main pulmonary artery was normal-sized. Systolic pressure was within the normal range.  ------------------------------------------------------------------- Right atrium:  The atrium was moderately dilated.  ------------------------------------------------------------------- Pericardium:  There was no pericardial effusion.  ------------------------------------------------------------------- Systemic veins: Inferior vena cava: The vessel was normal in size.  ------------------------------------------------------------------- Measurements   Left ventricle                         Value        Reference  LV ID, ED, PLAX chordal                46.1  mm     43 - 52  LV ID, ES, PLAX chordal                36.1  mm     23 - 38  LV fx shortening, PLAX chordal (L)     22    %      >=29  LV PW thickness, ED                     15.2  mm     ---------  IVS/LV PW ratio, ED                    0.98         <=1.3  LV e&', lateral                         5.77  cm/s   ---------  LV E/e&', lateral                       6.81         ---------  LV e&', medial                          4.68  cm/s   ---------  LV E/e&', medial                        8.4          ---------  LV e&', average  5.23  cm/s   ---------  LV E/e&', average                       7.52         ---------    Ventricular septum                     Value        Reference  IVS thickness, ED                      14.9  mm     ---------    LVOT                                   Value        Reference  LVOT peak velocity, S                  117   cm/s   ---------  LVOT mean velocity, S                  75.6  cm/s   ---------  LVOT VTI, S                            22    cm     ---------  LVOT peak gradient, S                  5     mm Hg  ---------    Aortic valve                           Value        Reference  Aortic valve peak velocity, S          230   cm/s   ---------  Aortic valve mean velocity, S          184   cm/s   ---------  Aortic valve VTI, S                    47    cm     ---------  Aortic mean gradient, S                15    mm Hg  ---------  Aortic peak gradient, S                21    mm Hg  ---------  VTI ratio, LVOT/AV                     0.47         ---------  Velocity ratio, peak, LVOT/AV          0.51         ---------  Velocity ratio, mean, LVOT/AV          0.41         ---------    Aorta                                  Value        Reference  Aortic root ID, ED  35    mm     ---------  Ascending aorta ID, A-P, S             38    mm     ---------    Left atrium                            Value        Reference  LA ID, A-P, ES                         41    mm     ---------  LA ID/bsa, A-P                 (H)     2.61  cm/m^2 <=2.2  LA volume, S                            63.8  ml     ---------  LA volume/bsa, S                       40.6  ml/m^2 ---------  LA volume, ES, 1-p A4C                 44.7  ml     ---------  LA volume/bsa, ES, 1-p A4C             28.5  ml/m^2 ---------  LA volume, ES, 1-p A2C                 90.5  ml     ---------  LA volume/bsa, ES, 1-p A2C             57.7  ml/m^2 ---------    Mitral valve                           Value        Reference  Mitral E-wave peak velocity            39.3  cm/s   ---------  Mitral A-wave peak velocity            58.7  cm/s   ---------  Mitral deceleration time       (H)     303   ms     150 - 230  Mitral E/A ratio, peak                 0.7          ---------    Pulmonary arteries                     Value        Reference  PA pressure, S, DP             (H)     32    mm Hg  <=30    Tricuspid valve                        Value        Reference  Tricuspid regurg peak velocity         267   cm/s   ---------  Tricuspid peak RV-RA gradient          29  mm Hg  ---------    Systemic veins                         Value        Reference  Estimated CVP                          3     mm Hg  ---------    Right ventricle                        Value        Reference  TAPSE                                  16.6  mm     ---------  RV pressure, S, DP             (H)     32    mm Hg  <=30  RV s&', lateral, S                      10.1  cm/s   ---------  Legend: (L)  and  (H)  mark values outside specified reference range.  ------------------------------------------------------------------- Prepared and Electronically Authenticated by  Fransico Him, MD 2017-08-11T14:17:53    CHEST  2 VIEW  COMPARISON:  PA and lateral chest x-ray of March 21, 2016  FINDINGS: The lungs are well-expanded. Small bilateral pleural effusions previously demonstrated have nearly totally resolved with minimal fluid remaining on the right. There is no alveolar infiltrate or pneumothorax. The cardiac silhouette remains  enlarged. The pulmonary vascularity is not engorged. The prosthetic aortic valve ring is visible. There is tortuosity of the descending thoracic aorta. The retrosternal soft tissues are normal. The sternal wires are intact.  IMPRESSION: Further interval decrease in the pleural effusions. No CHF nor other acute cardiopulmonary abnormality.   Electronically Signed   By: David  Martinique M.D.   On: 04/19/2016 14:01   Impression:  Patient is doing very well approximately one month status post aortic valve replacement using a bioprosthetic tissue valve. Clinically the patient looks very good. Follow-up echocardiogram performed last week looks good with normal functioning bioprosthetic tissue valve in the aortic position and improved left ventricular systolic function.  His blood pressure remains somewhat high.  Plan:  I have instructed the patient to increase his dose of lisinopril to 40 mg twice daily. All other medications will remain the same. The patient has been reminded that he should get a blood pressure cuff so that he can check his blood pressure at home on a regular basis. The patient has been encouraged to continue to gradually increase his physical activity with his primary limitation remaining that he refrain from heavy lifting or strenuous use of his arms or shoulders for at least another 2 months. Once he is no longer taking oral narcotic pain relievers for pain he may resume driving an automobile. I have encouraged him to enroll and participate in the outpatient cardiac rehabilitation program. He has been reminded how important it is to continue to abstain from all tobacco use. All of his questions have been addressed. The patient will return in 2 months for follow-up.    Valentina Gu. [name], MD 04/19/2016 2:21 PM

## 2016-05-11 ENCOUNTER — Ambulatory Visit: Payer: Medicare HMO | Admitting: Cardiology

## 2016-05-14 ENCOUNTER — Encounter: Payer: Self-pay | Admitting: Cardiovascular Disease

## 2016-05-14 ENCOUNTER — Ambulatory Visit (INDEPENDENT_AMBULATORY_CARE_PROVIDER_SITE_OTHER): Payer: Medicare HMO | Admitting: Cardiovascular Disease

## 2016-05-14 VITALS — BP 165/102 | HR 72 | Ht 64.0 in | Wt 124.2 lb

## 2016-05-14 DIAGNOSIS — Z952 Presence of prosthetic heart valve: Secondary | ICD-10-CM

## 2016-05-14 DIAGNOSIS — I10 Essential (primary) hypertension: Secondary | ICD-10-CM

## 2016-05-14 DIAGNOSIS — Z954 Presence of other heart-valve replacement: Secondary | ICD-10-CM | POA: Diagnosis not present

## 2016-05-14 MED ORDER — DOXAZOSIN MESYLATE 2 MG PO TABS: 2.0000 mg | ORAL_TABLET | Freq: Every day | ORAL | 3 refills | Status: DC

## 2016-05-14 MED ORDER — AMOXICILLIN 500 MG PO TABS: ORAL_TABLET | ORAL | 2 refills | Status: DC

## 2016-05-14 MED ORDER — DOXAZOSIN MESYLATE 2 MG PO TABS: 2.0000 mg | ORAL_TABLET | Freq: Every day | ORAL | 3 refills | Status: AC

## 2016-05-14 NOTE — Patient Instructions (Signed)
Medication Instructions:  START DOXAZOSIN 2 MG AT BEDTIME  AMOXIL 500 MG 4 TABLETS 1 HOUR PRIOR TO PROCEDURE HAS BEEN SENT TO YOUR PHARMACY WITH A NOTE THAT YOU WILL CALL WHEN YOU NEED IT FILLED   Labwork: NONE  Testing/Procedures: NON  Follow-Up: Your physician recommends that you schedule a follow-up appointment in: [place]  Your physician wants you to follow-up in: [place] will receive a reminder letter in the mail two months in advance. If you don't receive a letter, please call our office to schedule the follow-up appointment.  Any Other Special Instructions Will Be Listed Below (If Applicable). GET A BLOOD PRESSURE CUFF (NOT A WRIST CUFF). MONITOR YOUR BLOOD PRESSURE AT HOME AND IF IT REMAINS BELOW 140/90 YOU CAN CALL AND CANCEL BLOOD PRESSURE CHECK AT [phone]  If you need a refill on your cardiac medications before your next appointment, please call your pharmacy.

## 2016-05-14 NOTE — Addendum Note (Signed)
Addended by: Alvina Filbert B on: 05/14/2016 02:48 PM   Modules accepted: Orders

## 2016-05-14 NOTE — Progress Notes (Signed)
Cardiology Office Note   Date:  05/14/2016   ID:  Alexis [name], DOB [birth year], MRN [id number]  PCP:  [name], MD  Cardiologist:   [name], MD  Cardiothoracic Surgeon: Dr. [name]  Chief Complaint  Patient presents with  . Follow-up    1 Month; Pt states double vision randomly.       History of Present Illness: Alexis [name] is a 65 y.o. male with chronic Hepatitis C, seizures, CVA,  Chronic systolic and diastolic heart failure, hypertension, severe aortic regurgitaiton s/p bioprosthetic aortic valve replacement,  who presents for follow up after aortic valve replacement.  Mr. Alexis [name] was admitted 10/15/15 with shortness of breath and a 35 pound weight loss in 3 months.  Prior to that he had been told that he had a heart murmur but did not know of any heart conditions.  He was found to be in acute heart failure with a BNP of 2457.  Echo revealed LVEF 40-45% with severe aortic regurgitaiton and  moderate mitral and tricuspid regurgitation.  PASP was 81 mmHg.  Cardiology was not consulted.  He was treated with antibiotics, started on an ACE-I and diuresed with improvement in his respiratory status.  During that hospitalization he had speech difficutlties and was found to have a small acute infarct in the R frontal lobe.  He was readmitted 02/05/16 with heart failure and BNP >4500.  Echo that admission revealed severe AR with LVEF 35-40%.  He was seen by cardiology and diuresed.  LHC showed a 90% lesion of a small OM3 but otherwise no significant obstruction.    Alexis [name] had a 27 mm [facility] Ease pericardial aortic valve replacement by Dr. [name] on 03/18/16.  He required post operative transfusion.  He was discharged to a SNF and was subsequently discharged home. m at his last appointment on 7/31 his blood pressure was poorly-controlled.  Carvedilol was increased to 25 mg twice daily.  He saw Dr. [name] on 8/14 and lisinopril was increased to 40 mg twice daily, but he has been taking it  20mg  bid.  He had a postoperative echo on 04/16/16 that revealed LVEF 50-55% with a normal functioning aortic valve bioprosthesis and grade 1 diastolic dysfunction.  He has been feeling very well and denies chest pain or shortness of breath.  He has not noted any lower extremity edema, orthopnea or PND.  He does not check his BP at home.  His only complaint is intermittent episodes of diplopia.  This has occurred three times and lasts for 15-20 minutes each time.  He denies slurred speech, numbness, tingling or weakness.  He has not been getting any exercise but stays active.   Alexis [name] is a service connected veteran who gets his primary care at the [place].   Past Medical History:  Diagnosis Date  . Chronic systolic CHF (congestive heart failure) ([place])   . Depression   . GERD (gastroesophageal reflux disease)   . Headache   . Hepatitis C   . Hypertension   . Mitral regurgitation   . Nephrolithiasis   . Protein calorie malnutrition ([place])   . S/P aortic valve replacement with bioprosthetic valve 03/18/2016   27 mm [facility] Ease bovine pericardial tissue valve  . Seizure ([place])   . Severe aortic regurgitation   . Shortness of breath dyspnea    LYING FLAT   . Stroke (cerebrum) (Alexis [name]) 10/2015  . Tricuspid regurgitation     Past Surgical History:  Procedure Laterality  Date  . AORTIC VALVE REPLACEMENT N/A 03/18/2016   Procedure: AORTIC VALVE REPLACEMENT (AVR) implanted magna ease 12mm;  Surgeon: [name], MD;  Location: [place];  Service: Open Heart Surgery;  Laterality: N/A;  . CARDIAC CATHETERIZATION N/A 02/09/2016   Procedure: Right/Left Heart Cath and Coronary Angiography;  Surgeon: [name], MD;  Location: [place] CV [facility];  Service: Cardiovascular;  Laterality: N/A;  . FRACTURE SURGERY     RT ARM  . LEGS     BIL FX   . LITHOTRIPSY    . MULTIPLE EXTRACTIONS WITH ALVEOLOPLASTY N/A 03/01/2016   Procedure: Extraction of tooth #'s [id number] with alveoloplasty and gross  debridement of remaining teeth.;  Surgeon: [name], DDS;  Location: [facility];  Service: Oral Surgery;  Laterality: N/A;  . ROTATOR CUFF REPAIR    . TEE WITHOUT CARDIOVERSION N/A 02/11/2016   Procedure: TRANSESOPHAGEAL ECHOCARDIOGRAM (TEE);  Surgeon: [name], MD;  Location: [place];  Service: Cardiovascular;  Laterality: N/A;  . TEE WITHOUT CARDIOVERSION N/A 03/18/2016   Procedure: TRANSESOPHAGEAL ECHOCARDIOGRAM (TEE);  Surgeon: [name], MD;  Location: [facility];  Service: Open Heart Surgery;  Laterality: N/A;     Current Outpatient Prescriptions  Medication Sig Dispense Refill  . amLODipine (NORVASC) 10 MG tablet Take 1 tablet (10 mg total) by mouth daily. 30 tablet 1  . aspirin EC 325 MG EC tablet Take 1 tablet (325 mg total) by mouth daily. 30 tablet 0  . atorvastatin (LIPITOR) 10 MG tablet Take 1 tablet (10 mg total) by mouth daily at 6 PM. 30 tablet 0  . carvedilol (COREG) 25 MG tablet Take 1 tablet (25 mg total) by mouth 2 (two) times daily. 60 tablet 5  . chlorhexidine (PERIDEX) 0.12 % solution Rinse with 15 mls twice daily for 30 seconds. Use after breakfast and at bedtime. Spit out excess. Do not swallow. 480 mL prn  . lisinopril (PRINIVIL,ZESTRIL) 20 MG tablet Take 20 mg by mouth daily.    Marland Kitchen amoxicillin (AMOXIL) 500 MG tablet 4 TABLETS BY MOUTH 1 HOUR PRIOR TO PROCEDURE 4 tablet 2  . doxazosin (CARDURA) 2 MG tablet Take 1 tablet (2 mg total) by mouth at bedtime. 30 tablet 3   No current facility-administered medications for this visit.     Allergies:   No known allergies    Social History:  The patient  reports that he quit smoking about 44 years ago. His smoking use included Cigarettes. He has a 3.00 pack-year smoking history. He has quit using smokeless tobacco. His smokeless tobacco use included Chew. He reports that he drinks alcohol. He reports that he uses drugs, including Marijuana.   Family History:  The patient's family history includes Aneurysm in  his mother; Breast cancer in his mother; Cancer in his father; Hypertension in his brother and father.    ROS:  Please see the history of present illness.   Otherwise, review of systems are positive for none.   All other systems are reviewed and negative.    PHYSICAL EXAM: VS:  BP (!) 165/102   Pulse 72   Ht 5\' 4"  (1.626 m)   Wt 124 lb 3.2 oz (56.3 kg)   BMI 21.32 kg/m  , BMI Body mass index is 21.32 kg/m. GENERAL:  Well appearing HEENT:  Pupils equal round and reactive, fundi not visualized, oral mucosa unremarkable NECK:  No jugular venous distention, waveform within normal limits, carotid upstroke brisk and symmetric, no bruits, no thyromegaly  LYMPHATICS:  No cervical adenopathy LUNGS:  Clear to auscultation bilaterally HEART:  RRR.  PMI not displaced or sustained,S1 and S2 within normal limits, no S3, no S4, no clicks, no rubs, no murmurs ABD:  Flat, positive bowel sounds normal in frequency in pitch, no bruits, no rebound, no guarding, no midline pulsatile mass, no hepatomegaly, no splenomegaly EXT:  2 plus pulses throughout, no edema, no cyanosis no clubbing SKIN:  No rashes no nodules NEURO:  Cranial nerves II through XII grossly intact, motor grossly intact throughout PSYCH:  Cognitively intact, oriented to person place and time   EKG:  EKG is ordered today. 05/14/16: Sinus rhythm. Rate 72 bpm. Right axis deviation. LVH with repolarization abnormalities. The ekg ordered today demonstrates sinus rhythm rate 79 bpm.  R axis deviation.  Poor R wave progression.     Recent Labs: 10/17/2015: TSH 1.828 02/05/2016: B Natriuretic Peptide >4,500.0 03/16/2016: ALT 21 03/19/2016: Magnesium 2.2 03/21/2016: BUN 15; Creatinine, Ser 0.60; Hemoglobin 9.6; Platelets 116; Potassium 3.8; Sodium 134    Lipid Panel    Component Value Date/Time   CHOL 159 10/16/2015 0235   TRIG 41 10/16/2015 0235   HDL 49 10/16/2015 0235   CHOLHDL 3.2 10/16/2015 0235   VLDL 8 10/16/2015 0235   LDLCALC 102  (H) 10/16/2015 0235      Wt Readings from Last 3 Encounters:  05/14/16 124 lb 3.2 oz (56.3 kg)  04/19/16 120 lb (54.4 kg)  04/05/16 120 lb 6.4 oz (54.6 kg)      ASSESSMENT AND PLAN:  # s/p 27 mm [facility] Magna Ease pericardial AVR: Mr. Dimitriou is doing very well post AVR. He was reminded about endocarditis prophylaxis with amoxicillin prior to dental procedures.  # Hypertension: BP remains poorly-controlled. Continue amlodipine, carvedilol, and lisinopril. We will start doxazosin 2mg  qhs, as he also reports nocturia.  # Pulmonary hypertension:  Preoperative echo revealed severe pulmonary hypertension.  Post-operatively his PA systolic pressure normalized to 32 mmHg.   Current medicines are reviewed at length with the patient today.  The patient does not have concerns regarding medicines.  The following changes have been made:  Increase carvedilol to 25 mg bid.   Labs/ tests ordered today include:   No orders of the defined types were placed in this encounter.    Disposition:   FU with Hubbert Landrigan C. Oval Linsey, MD, [facility] in 1 year.  BP clinic in 2 weeks.    This note was written with the assistance of speech recognition software.  Please excuse any transcriptional errors.  Signed, Xitlalli Newhard C. Oval Linsey, MD, [facility]  05/14/2016 11:39 AM    [facility]

## 2016-06-03 ENCOUNTER — Encounter (HOSPITAL_COMMUNITY): Payer: Self-pay

## 2016-06-21 ENCOUNTER — Encounter: Payer: Self-pay | Admitting: Thoracic Surgery (Cardiothoracic Vascular Surgery)

## 2016-06-28 ENCOUNTER — Ambulatory Visit (INDEPENDENT_AMBULATORY_CARE_PROVIDER_SITE_OTHER): Payer: Medicare HMO | Admitting: Thoracic Surgery (Cardiothoracic Vascular Surgery)

## 2016-06-28 ENCOUNTER — Encounter: Payer: Self-pay | Admitting: Thoracic Surgery (Cardiothoracic Vascular Surgery)

## 2016-06-28 VITALS — BP 181/111 | HR 60 | Resp 16 | Ht 64.0 in | Wt 128.0 lb

## 2016-06-28 DIAGNOSIS — I351 Nonrheumatic aortic (valve) insufficiency: Secondary | ICD-10-CM

## 2016-06-28 DIAGNOSIS — Z953 Presence of xenogenic heart valve: Secondary | ICD-10-CM

## 2016-06-28 NOTE — Patient Instructions (Addendum)
You may resume unrestricted physical activity without any particular limitations at this time.  Take your blood pressure medications every day as prescribed.  Continue to monitor your blood pressure closely and follow up closely with Dr. Oval Linsey for adjustments to your medications   Endocarditis is a potentially serious infection of heart valves or inside lining of the heart.  It occurs more commonly in patients with diseased heart valves (such as patient's with aortic or mitral valve disease) and in patients who have undergone heart valve repair or replacement.  Certain surgical and dental procedures may put you at risk, such as dental cleaning, other dental procedures, or any surgery involving the respiratory, urinary, gastrointestinal tract, gallbladder or prostate gland.   To minimize your chances for develooping endocarditis, maintain good oral health and seek prompt medical attention for any infections involving the mouth, teeth, gums, skin or urinary tract.    Always notify your doctor or dentist about your underlying heart valve condition before having any invasive procedures. You will need to take antibiotics before certain procedures, including all routine dental cleanings or other dental procedures.  Your cardiologist or dentist should prescribe these antibiotics for you to be taken ahead of time.

## 2016-06-28 NOTE — Progress Notes (Signed)
[address]       [place] [address]             [phone]     CARDIOTHORACIC SURGERY OFFICE NOTE  Referring Provider is [name], MD PCP is [name], MD   HPI:  Patient is a 65 year old African-American male who returns to the office today for routine follow-up status post aortic valve replacement using a bioprosthetic tissue valve on 03/18/2016 for severe symptomatic aortic insufficiency. He was last seen here in our office on 04/19/2016 at which time he was doing well. He has been followed carefully by Dr. Oval Linsey who has been managing his hypertension, which has been somewhat difficult to control. He returns to our office today reporting that he feels remarkably good. He is delighted with how much better he feels than he did prior to surgery. He states that he gets short of breath with strenuous activity but his breathing is dramatically better than it was and has been for quite some time. He has very minimal residual soreness in his chest. He has not been using any sort of pain relievers. He states that his blood pressure at home has been running typically 0000000 systolic. It is higher in our office today but he admits that he did not take his lisinopril today. Overall he feels well and he has no complaints.   Current Outpatient Prescriptions  Medication Sig Dispense Refill  . amLODipine (NORVASC) 10 MG tablet Take 1 tablet (10 mg total) by mouth daily. 30 tablet 1  . amoxicillin (AMOXIL) 500 MG tablet 4 TABLETS BY MOUTH 1 HOUR PRIOR TO PROCEDURE 4 tablet 2  . aspirin EC 325 MG EC tablet Take 1 tablet (325 mg total) by mouth daily. 30 tablet 0  . atorvastatin (LIPITOR) 10 MG tablet Take 1 tablet (10 mg total) by mouth daily at 6 PM. 30 tablet 0  . butalbital-aspirin-caffeine (FIORINAL) 50-325-40 MG capsule Take 1 capsule by mouth every 6 (six) hours as needed for headache.    . carvedilol (COREG) 25 MG tablet Take 1 tablet (25 mg total) by  mouth 2 (two) times daily. 60 tablet 5  . doxazosin (CARDURA) 2 MG tablet Take 1 tablet (2 mg total) by mouth at bedtime. 30 tablet 3  . lisinopril (PRINIVIL,ZESTRIL) 20 MG tablet Take 20 mg by mouth daily.     No current facility-administered medications for this visit.       Physical Exam:   BP (!) 181/111   Pulse 60   Resp 16   Ht 5\' 4"  (1.626 m)   Wt 128 lb (58.1 kg)   BMI 21.97 kg/m   General:  Well-appearing  Chest:   Clear to auscultation  CV:   Regular rate and rhythm  Incisions:  Well-healed, sternum is stable  Abdomen:  Soft nontender  Extremities:  Warm and well-perfused  Diagnostic Tests:  Echocardiography  Patient:    Alexis [name], Alexis [name] MR #:       [id number] Study Date: 04/16/2016 Gender:     M Age:        72 Height:     162.6 cm Weight:     54.6 kg BSA:        1.57 m^2 Pt. Status: Room:   ATTENDING    Fransico Him, MD  SONOGRAPHER  Victorio Palm, RDCS  PERFORMING   [facility]  ORDERING     [name], MD  REFERRING    [name], MD  cc:  ------------------------------------------------------------------- LV EF: 50% -   55%  ------------------------------------------------------------------- Indications:      (I35.0).  (z95.4).  ------------------------------------------------------------------- History:   PMH:  Acquired from the patient and from the patient&'s chart.  Congestive heart failure, with an ejection fraction of 40%by echocardiography. The dysfunction is both systolic and diastolic.  Borderline significant aortic regurgitation.  Mild mitral regurgitation.  Stroke.  Risk factors:  Former tobacco use. Hypertension.  ------------------------------------------------------------------- Study Conclusions  - Left ventricle: The cavity size was normal. There was moderate   concentric hypertrophy. Systolic function was normal. The   estimated ejection fraction was in the range of 50% to 55%. Wall   motion was  normal; there were no regional wall motion   abnormalities. There was an increased relative contribution of   atrial contraction to ventricular filling. Doppler parameters are   consistent with abnormal left ventricular relaxation (grade 1   diastolic dysfunction). - Aortic valve: A pericardial bioprosthesis was present. Mean   gradient (S): 15 mm Hg. Peak gradient (S): 21 mm Hg. - Aorta: Ascending aortic diameter: 38 mm (S). - Left atrium: The atrium was mildly dilated. - Right atrium: The atrium was moderately dilated. - Atrial septum: There was increased thickness of the septum,   consistent with lipomatous hypertrophy. - Pulmonary arteries: PA peak pressure: 32 mm Hg (S).  ------------------------------------------------------------------- Labs, prior tests, procedures, and surgery: Echocardiography (June 2017).    The aortic valve showed severe regurgitation. The mitral valve showed mild regurgitation.  EF was 40% and PA pressure was 45 (systolic).  Valve surgery (July 2017).    Aortic valve replacement with a 27 mm Magna Ease pericardial valve.  ------------------------------------------------------------------- Study data:  Comparison was made to the study of June 2017.  Study status:  Routine.  Procedure:  The patient reported no pain pre or post test. Transthoracic echocardiography for left ventricular function evaluation, for right ventricular function evaluation, for assessment of valvular function, and for evaluation of pulmonary pressures. Image quality was adequate.  Study completion:  There were no complications.          Echocardiography.  M-mode, complete 2D, 3D, spectral Doppler, and color Doppler.  Birthdate:  Patient birthdate: Sep 27, 1950.  Age:  Patient is 65 yr old.  Sex:  Gender: male.    BMI: 20.7 kg/m^2.  Blood pressure:     180/92  Patient status:  Outpatient.  Study date:  Study date: 04/16/2016. Study time: 09:47 AM.  Location:  [facility]  3  -------------------------------------------------------------------  ------------------------------------------------------------------- Left ventricle:  The cavity size was normal. There was moderate concentric hypertrophy. Systolic function was normal. The estimated ejection fraction was in the range of 50% to 55%. Wall motion was normal; there were no regional wall motion abnormalities. There was an increased relative contribution of atrial contraction to ventricular filling. Doppler parameters are consistent with abnormal left ventricular relaxation (grade 1 diastolic dysfunction).  ------------------------------------------------------------------- Aortic valve:   Normal thickness leaflets. A pericardial bioprosthesis was present. Mobility was not restricted.  Doppler: Transvalvular velocity was within the normal range. There was no stenosis. There was no regurgitation. There was no significant perivalvular regurgitation.    VTI ratio of LVOT to aortic valve: 0.47. Peak velocity ratio of LVOT to aortic valve: 0.51. Mean velocity ratio of LVOT to aortic valve: 0.41.    Mean gradient (S): 15 mm Hg. Peak gradient (S): 21 mm Hg.  ------------------------------------------------------------------- Aorta:  Aortic root: The aortic root was normal in size. Ascending aorta: The ascending  aorta was mildly dilated.  ------------------------------------------------------------------- Mitral valve:   Structurally normal valve.   Mobility was not restricted.  Doppler:  Transvalvular velocity was within the normal range. There was no evidence for stenosis. There was trivial regurgitation.  ------------------------------------------------------------------- Left atrium:  The atrium was mildly dilated.  ------------------------------------------------------------------- Atrial septum:  There was increased thickness of the septum, consistent with lipomatous  hypertrophy.  ------------------------------------------------------------------- Right ventricle:  The cavity size was normal. Wall thickness was normal. Systolic function was normal.  ------------------------------------------------------------------- Pulmonic valve:    Structurally normal valve.   Cusp separation was normal.  Doppler:  Transvalvular velocity was within the normal range. There was no evidence for stenosis. There was no regurgitation.  ------------------------------------------------------------------- Tricuspid valve:   Structurally normal valve.   Leaflet separation was normal.  Doppler:  Transvalvular velocity was within the normal range. There was mild regurgitation.  ------------------------------------------------------------------- Pulmonary artery:   The main pulmonary artery was normal-sized. Systolic pressure was within the normal range.  ------------------------------------------------------------------- Right atrium:  The atrium was moderately dilated.  ------------------------------------------------------------------- Pericardium:  There was no pericardial effusion.  ------------------------------------------------------------------- Systemic veins: Inferior vena cava: The vessel was normal in size.  ------------------------------------------------------------------- Measurements   Left ventricle                         Value        Reference  LV ID, ED, PLAX chordal                46.1  mm     43 - 52  LV ID, ES, PLAX chordal                36.1  mm     23 - 38  LV fx shortening, PLAX chordal (L)     22    %      >=29  LV PW thickness, ED                    15.2  mm     ---------  IVS/LV PW ratio, ED                    0.98         <=1.3  LV e&', lateral                         5.77  cm/s   ---------  LV E/e&', lateral                       6.81         ---------  LV e&', medial                          4.68  cm/s   ---------  LV  E/e&', medial                        8.4          ---------  LV e&', average                         5.23  cm/s   ---------  LV E/e&', average                       7.52         ---------  Ventricular septum                     Value        Reference  IVS thickness, ED                      14.9  mm     ---------    LVOT                                   Value        Reference  LVOT peak velocity, S                  117   cm/s   ---------  LVOT mean velocity, S                  75.6  cm/s   ---------  LVOT VTI, S                            22    cm     ---------  LVOT peak gradient, S                  5     mm Hg  ---------    Aortic valve                           Value        Reference  Aortic valve peak velocity, S          230   cm/s   ---------  Aortic valve mean velocity, S          184   cm/s   ---------  Aortic valve VTI, S                    47    cm     ---------  Aortic mean gradient, S                15    mm Hg  ---------  Aortic peak gradient, S                21    mm Hg  ---------  VTI ratio, LVOT/AV                     0.47         ---------  Velocity ratio, peak, LVOT/AV          0.51         ---------  Velocity ratio, mean, LVOT/AV          0.41         ---------    Aorta                                  Value        Reference  Aortic root ID, ED                     35    mm     ---------  Ascending aorta ID, A-P, S             38    mm     ---------  Left atrium                            Value        Reference  LA ID, A-P, ES                         41    mm     ---------  LA ID/bsa, A-P                 (H)     2.61  cm/m^2 <=2.2  LA volume, S                           63.8  ml     ---------  LA volume/bsa, S                       40.6  ml/m^2 ---------  LA volume, ES, 1-p A4C                 44.7  ml     ---------  LA volume/bsa, ES, 1-p A4C             28.5  ml/m^2 ---------  LA volume, ES, 1-p A2C                 90.5  ml     ---------  LA volume/bsa, ES,  1-p A2C             57.7  ml/m^2 ---------    Mitral valve                           Value        Reference  Mitral E-wave peak velocity            39.3  cm/s   ---------  Mitral A-wave peak velocity            58.7  cm/s   ---------  Mitral deceleration time       (H)     303   ms     150 - 230  Mitral E/A ratio, peak                 0.7          ---------    Pulmonary arteries                     Value        Reference  PA pressure, S, DP             (H)     32    mm Hg  <=30    Tricuspid valve                        Value        Reference  Tricuspid regurg peak velocity         267   cm/s   ---------  Tricuspid peak RV-RA gradient          29    mm Hg  ---------    Systemic veins                         Value  Reference  Estimated CVP                          3     mm Hg  ---------    Right ventricle                        Value        Reference  TAPSE                                  16.6  mm     ---------  RV pressure, S, DP             (H)     32    mm Hg  <=30  RV s&', lateral, S                      10.1  cm/s   ---------  Legend: (L)  and  (H)  mark values outside specified reference range.  ------------------------------------------------------------------- Prepared and Electronically Authenticated by  Fransico Him, MD 2017-08-11T14:17:53   Impression:  Asian is doing well proximally 3 months status post aortic valve replacement using a bioprosthetic tissue valve. He has severe hypertension that is requiring multiple drugs for management. His blood pressure is running a little bit high in our office today but he admits that he did not take his lisinopril as instructed today.  Plan:  I have instructed the patient to continue to increase his physical activity without any particular limitations at this time. I've reminded him to keep close tabs on his blood pressure and follow-up closely with Dr. Oval Linsey for adjustments to his antihypertensive medications as  needed. We have not recommended any changes to his medications today.  The patient has been reminded regarding the importance of dental hygiene and the lifelong need for antibiotic prophylaxis for all dental cleanings and other related invasive procedures.  The patient will return to our office for routine follow-up next July, approximately 1 year following his original surgery. He will call and return sooner should specific problems or questions arise.  I spent in excess of 15 minutes during the conduct of this office consultation and >50% of this time involved direct face-to-face encounter with the patient for counseling and/or coordination of their care.    Valentina Gu. [name], MD 06/28/2016 4:33 PM

## 2016-09-20 ENCOUNTER — Telehealth (HOSPITAL_COMMUNITY): Payer: Self-pay | Admitting: Internal Medicine

## 2016-09-20 NOTE — Telephone Encounter (Signed)
Lft msg for pt to c/b to verify if he is interested in the Cardiac Rehab class... KJ

## 2016-11-26 IMAGING — CR DG CHEST 2V
2 series · 2 of 2 positions shown · non-contrast
Comparison: 02/05/2016

CLINICAL DATA: Preoperative evaluation for upcoming cardiac surgery

EXAM:
CHEST  2 VIEW

[w chest pa]
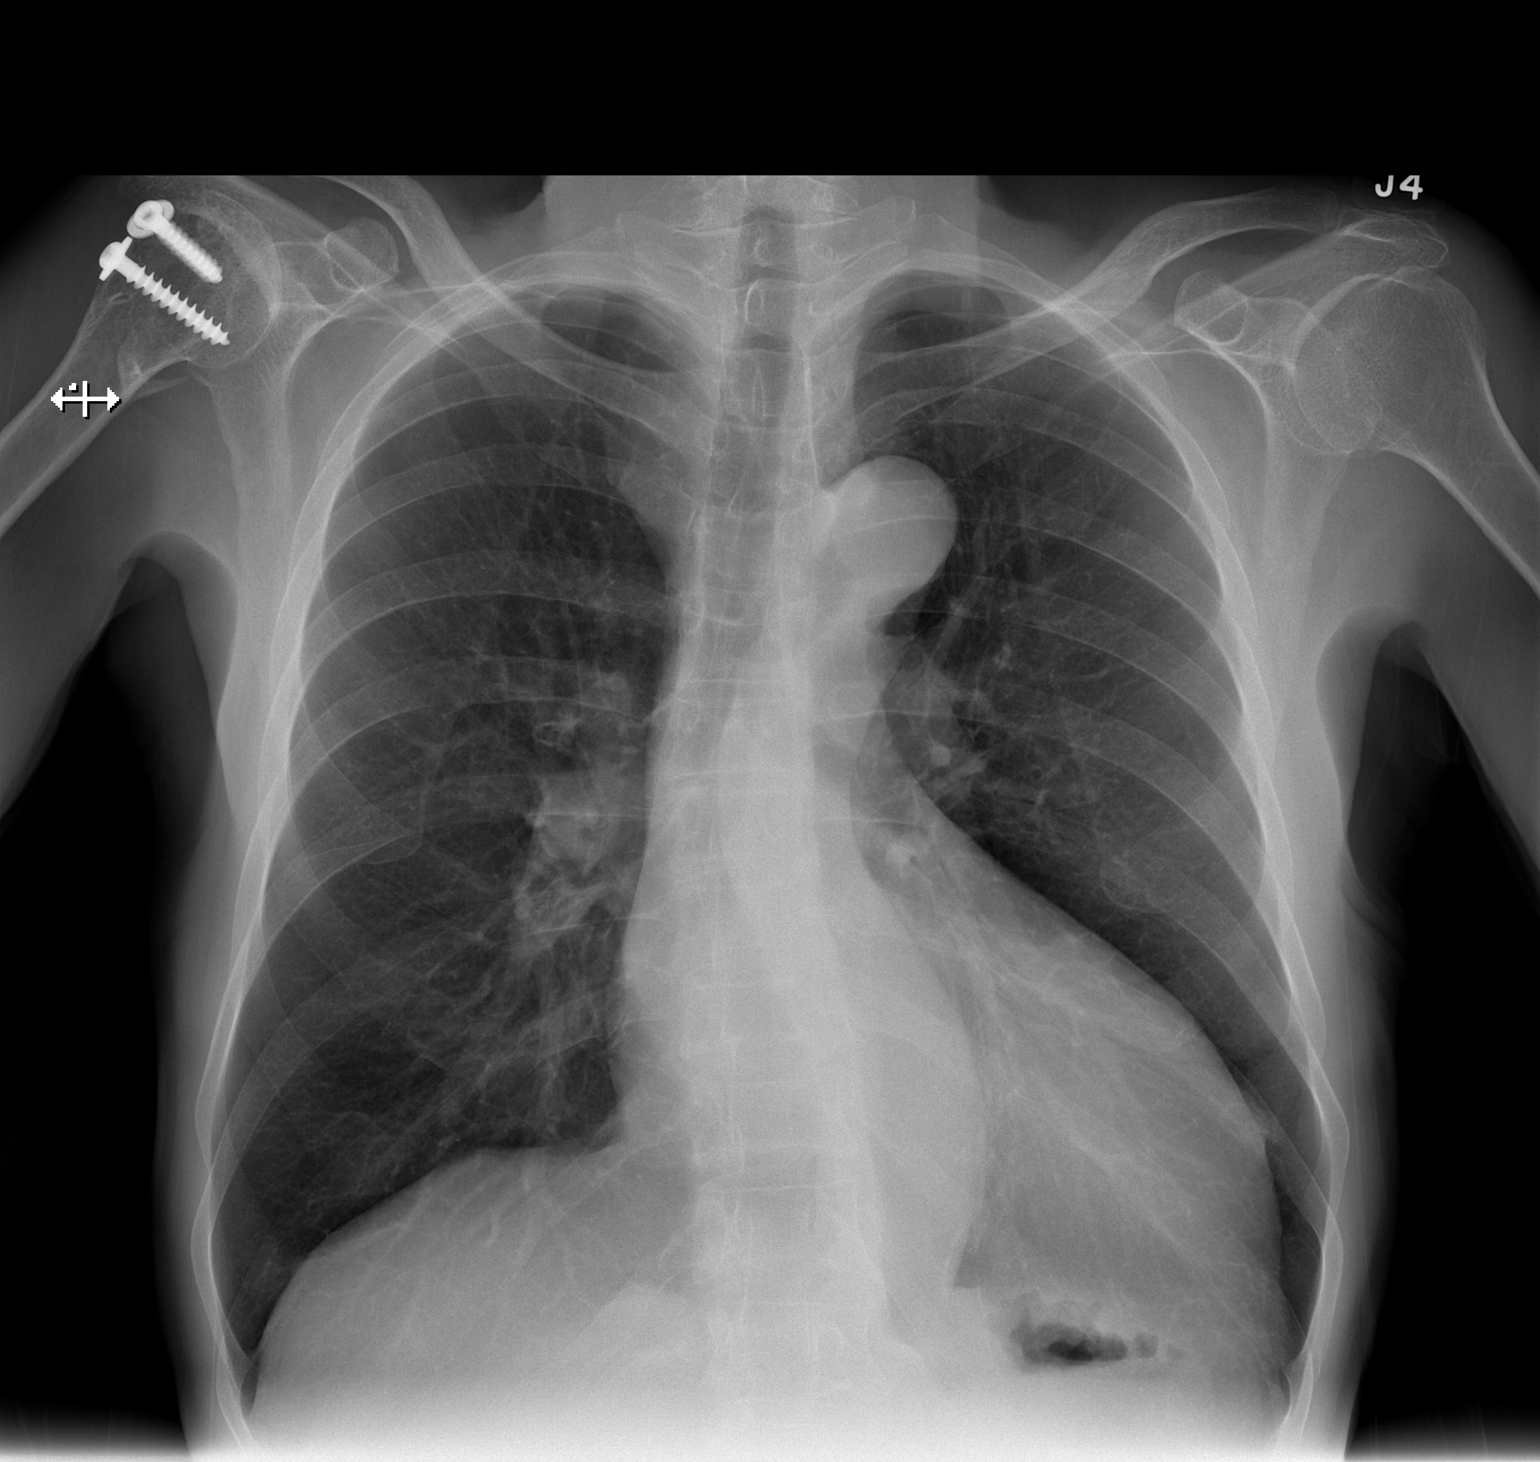

[w chest lat]
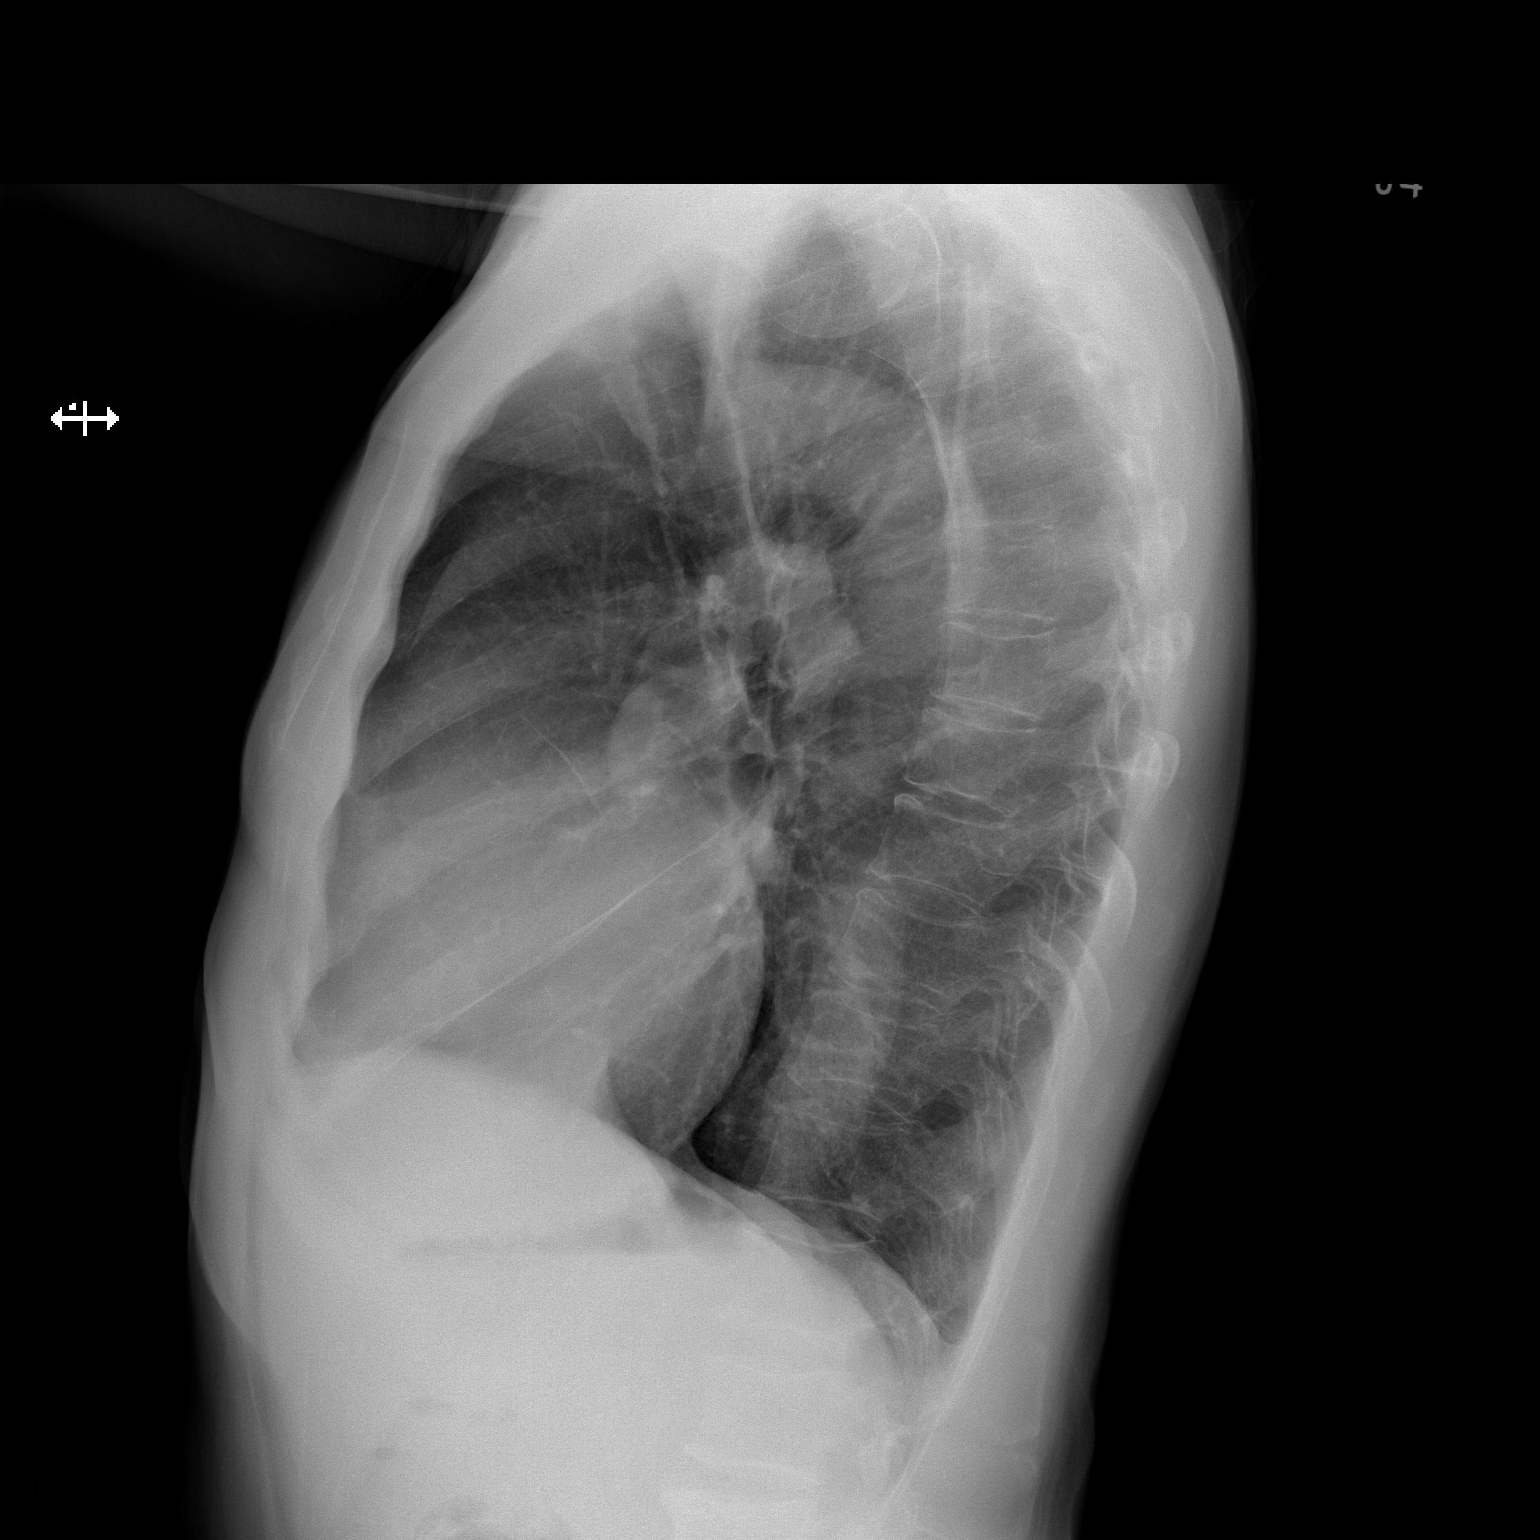

[2 of 2 positions shown; findings below may reference images not displayed]

FINDINGS: Cardiac shadow is at the upper limits of normal in size. The
thoracic aorta is tortuous. The lungs are clear bilaterally. No
acute bony abnormality is seen. Postsurgical changes are noted in
the proximal right humerus.
IMPRESSION: No active cardiopulmonary disease.

## 2016-11-28 IMAGING — CR DG CHEST 1V PORT
1 series · 1 of 1 positions shown · non-contrast
Comparison: 03/16/2016

CLINICAL DATA: Atelectasis

EXAM:
PORTABLE CHEST 1 VIEW

[AP]
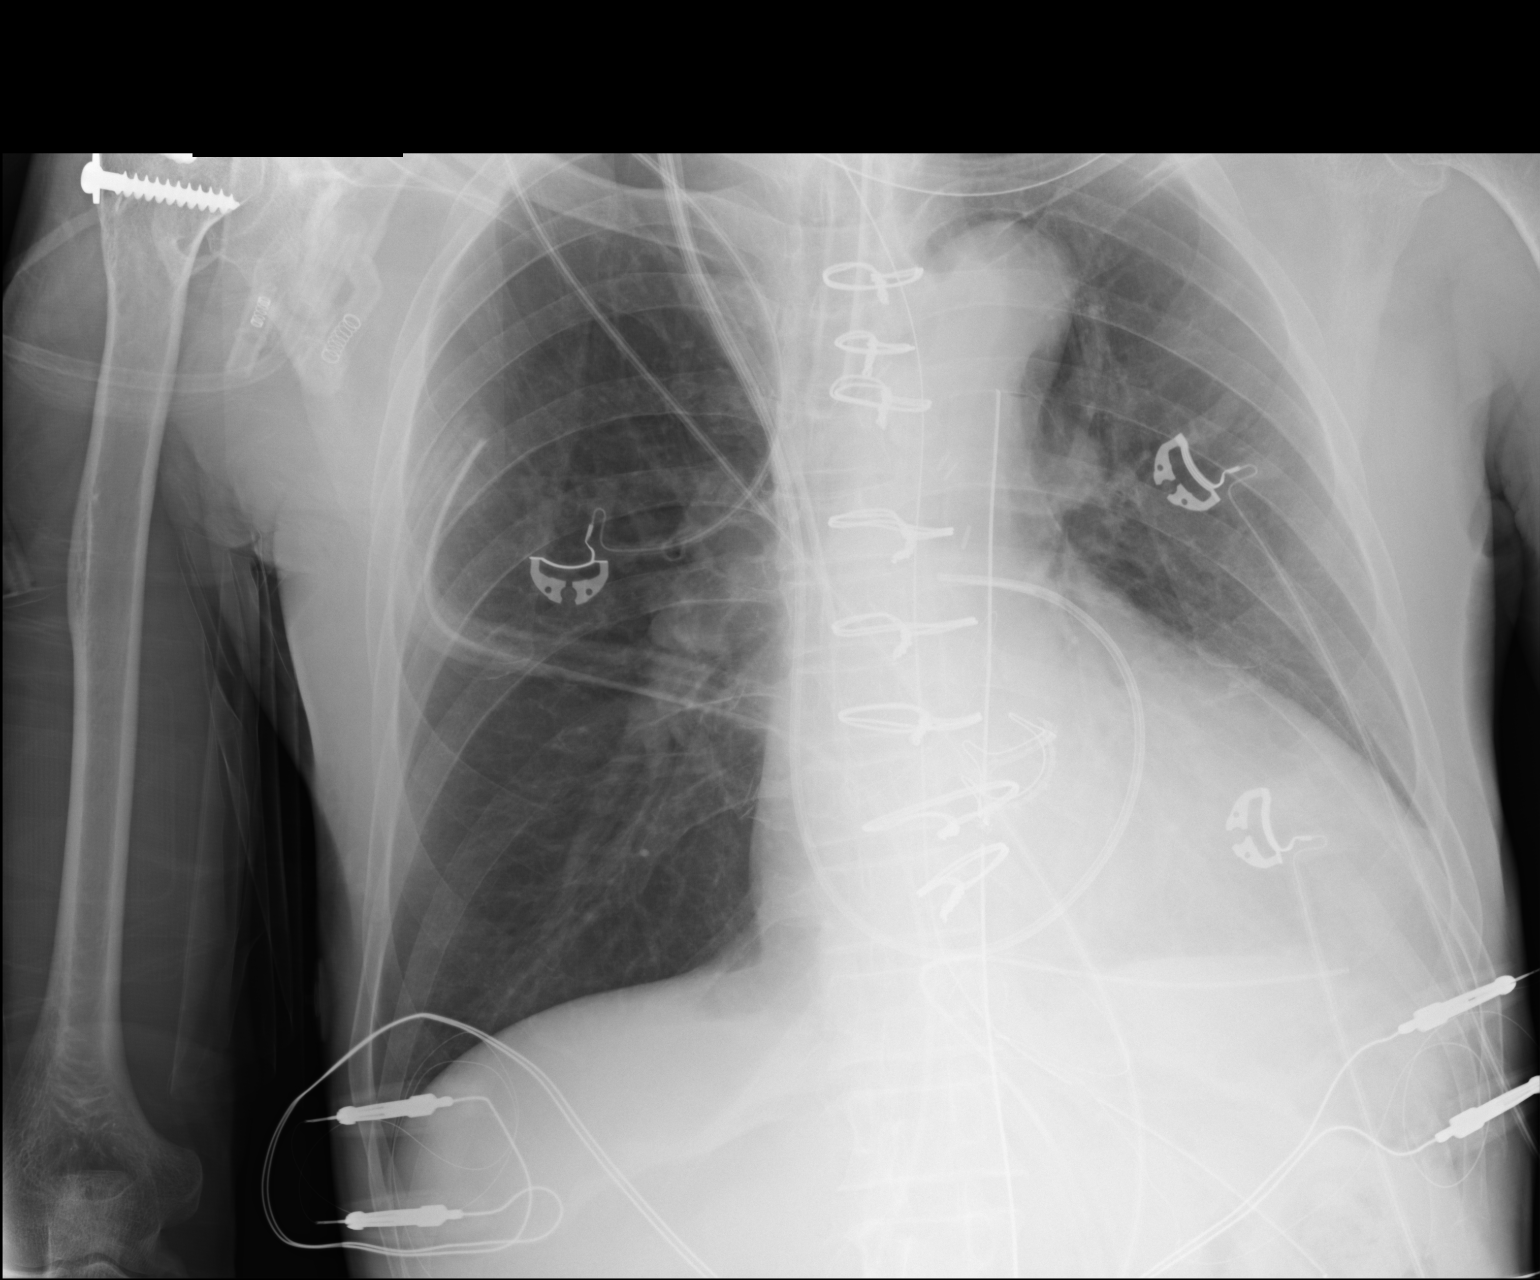

[1 of 1 positions shown; findings below may reference images not displayed]

FINDINGS: The patient is status post median sternotomy and cardiac valve
replacement.

No infiltrate or pulmonary edema. Endotracheal tube in place with
tip 2.3 cm above the carina. Right IJ central line with tip in
distal SVC. Right IJ Swan-Ganz catheter with tip in the region of
main pulmonary artery. Mediastinal drain in place. Right chest tube
in place. Tiny right apical pneumothorax.
IMPRESSION: No infiltrate or pulmonary edema. Endotracheal tube in place with
tip 2.3 cm above the carina. Right IJ central line with tip in
distal SVC. Right IJ Swan-Ganz catheter with tip in the region of
main pulmonary artery. Mediastinal drain in place. Right chest tube
in place. Tiny right apical pneumothorax..

## 2016-11-29 IMAGING — CR DG CHEST 1V PORT
1 series · 1 of 1 positions shown · non-contrast
Comparison: Portable chest x-ray March 18, 2016

CLINICAL DATA: Coronary artery disease, aortic valve replacement
yesterday.

EXAM:
PORTABLE CHEST 1 VIEW

[AP]
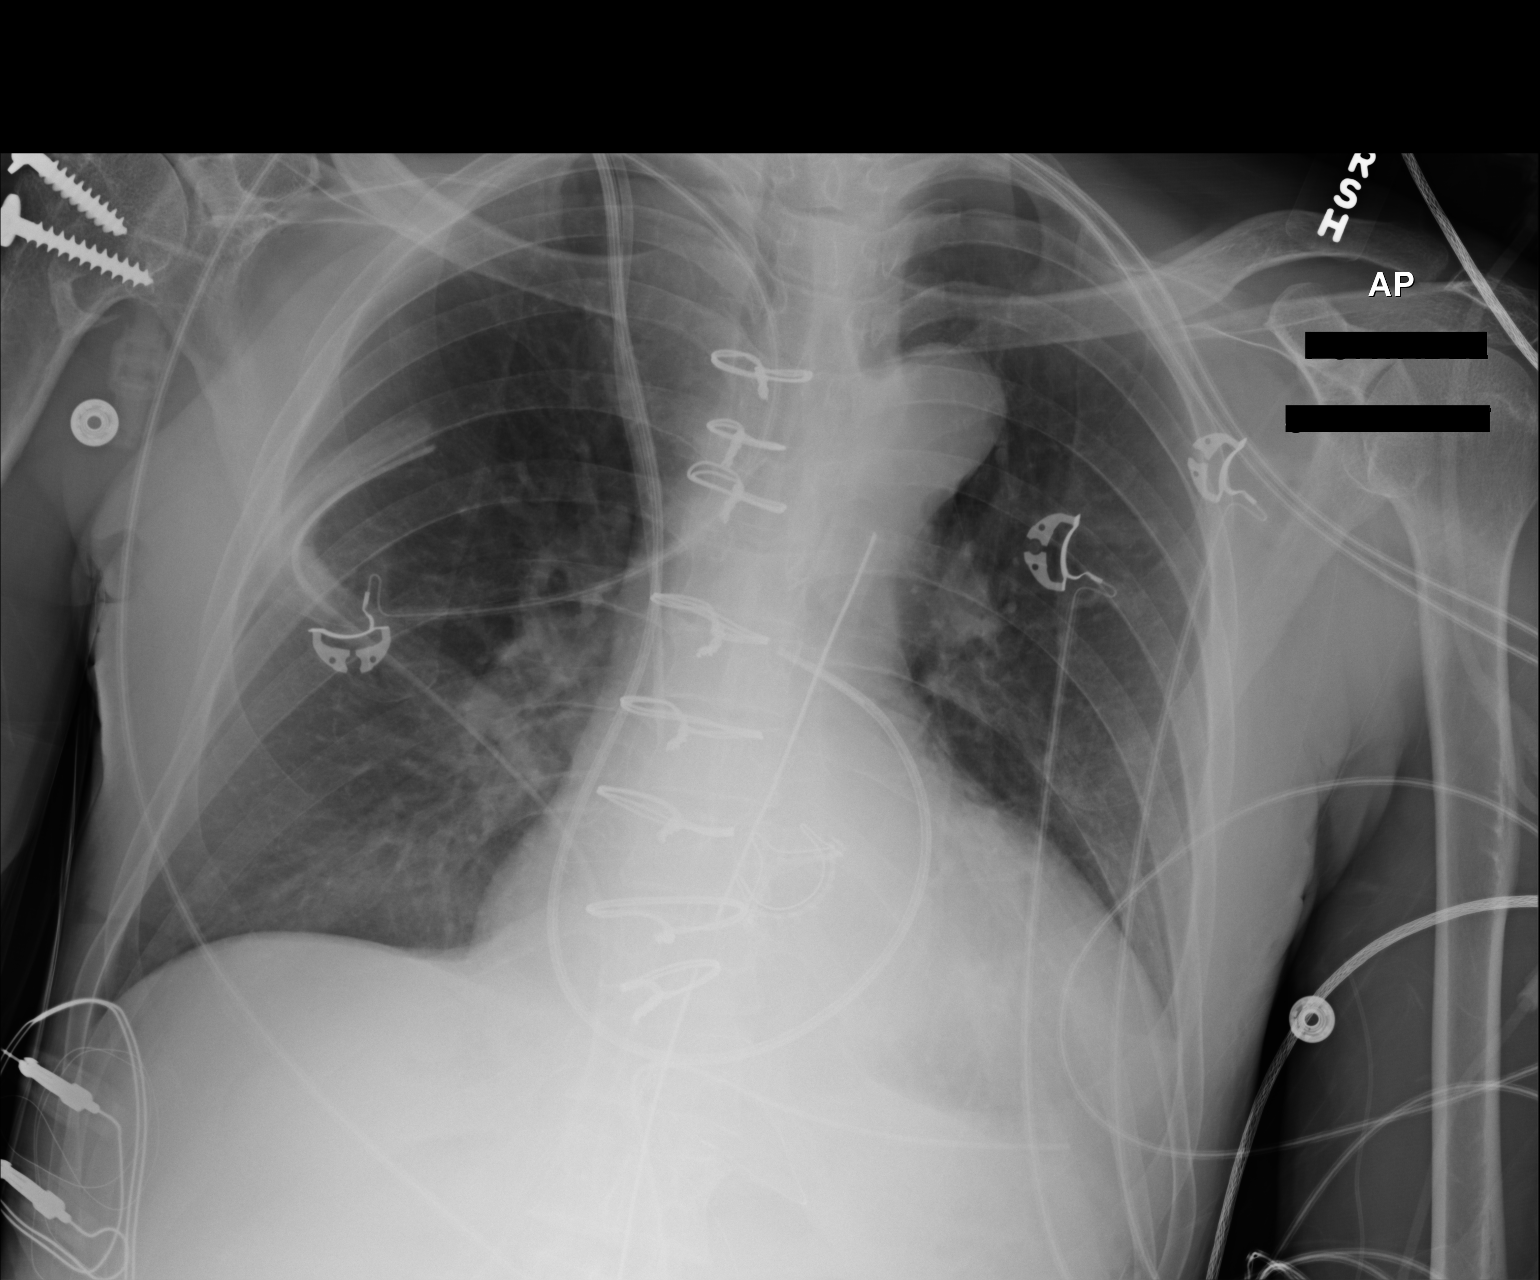

[1 of 1 positions shown; findings below may reference images not displayed]

FINDINGS: There has been interval extubation of the trachea and of the
esophagus. The lungs are well-expanded. The tiny right apical
pneumothorax is no longer evident. No left-sided pneumothorax is
observed. The bilateral chest tubes and the mediastinal drain are in
stable position. The Swan-Ganz catheter tip projects over the
proximal right main pulmonary artery. A right internal jugular
venous catheter tip projects over the midportion of the SVC. A trace
of pleural fluid at the left lung base is suspected. The prosthetic
aortic valve cage is stable in appearance. The sternal wires are
intact.
IMPRESSION: Interval extubation of the trachea and esophagus. No CHF, pneumonia,
or pneumothorax. Trace left pleural effusion. The support tubes are
in stable position.

## 2017-01-28 ENCOUNTER — Observation Stay (HOSPITAL_COMMUNITY)
Admission: EM | Admit: 2017-01-28 | Discharge: 2017-01-30 | Disposition: A | Discharge status: Home / Self Care | Payer: Medicare HMO | Attending: Nephrology | Admitting: Nephrology

## 2017-01-28 ENCOUNTER — Encounter (HOSPITAL_COMMUNITY): Payer: Self-pay | Admitting: *Deleted

## 2017-01-28 DIAGNOSIS — I110 Hypertensive heart disease with heart failure: Secondary | ICD-10-CM | POA: Insufficient documentation

## 2017-01-28 DIAGNOSIS — G40909 Epilepsy, unspecified, not intractable, without status epilepticus: Secondary | ICD-10-CM | POA: Insufficient documentation

## 2017-01-28 DIAGNOSIS — Z7982 Long term (current) use of aspirin: Secondary | ICD-10-CM | POA: Diagnosis not present

## 2017-01-28 DIAGNOSIS — G44029 Chronic cluster headache, not intractable: Secondary | ICD-10-CM | POA: Diagnosis not present

## 2017-01-28 DIAGNOSIS — E43 Unspecified severe protein-calorie malnutrition: Secondary | ICD-10-CM | POA: Insufficient documentation

## 2017-01-28 DIAGNOSIS — Z79899 Other long term (current) drug therapy: Secondary | ICD-10-CM | POA: Diagnosis not present

## 2017-01-28 DIAGNOSIS — E876 Hypokalemia: Secondary | ICD-10-CM | POA: Insufficient documentation

## 2017-01-28 DIAGNOSIS — B182 Chronic viral hepatitis C: Secondary | ICD-10-CM | POA: Diagnosis not present

## 2017-01-28 DIAGNOSIS — K921 Melena: Secondary | ICD-10-CM | POA: Diagnosis not present

## 2017-01-28 DIAGNOSIS — I2510 Atherosclerotic heart disease of native coronary artery without angina pectoris: Secondary | ICD-10-CM | POA: Diagnosis present

## 2017-01-28 DIAGNOSIS — I340 Nonrheumatic mitral (valve) insufficiency: Secondary | ICD-10-CM | POA: Insufficient documentation

## 2017-01-28 DIAGNOSIS — F329 Major depressive disorder, single episode, unspecified: Secondary | ICD-10-CM | POA: Insufficient documentation

## 2017-01-28 DIAGNOSIS — I10 Essential (primary) hypertension: Secondary | ICD-10-CM | POA: Diagnosis present

## 2017-01-28 DIAGNOSIS — K219 Gastro-esophageal reflux disease without esophagitis: Secondary | ICD-10-CM | POA: Insufficient documentation

## 2017-01-28 DIAGNOSIS — K625 Hemorrhage of anus and rectum: Secondary | ICD-10-CM | POA: Diagnosis present

## 2017-01-28 DIAGNOSIS — Z87891 Personal history of nicotine dependence: Secondary | ICD-10-CM | POA: Diagnosis not present

## 2017-01-28 DIAGNOSIS — I5022 Chronic systolic (congestive) heart failure: Secondary | ICD-10-CM | POA: Insufficient documentation

## 2017-01-28 DIAGNOSIS — Z953 Presence of xenogenic heart valve: Secondary | ICD-10-CM | POA: Diagnosis not present

## 2017-01-28 DIAGNOSIS — N179 Acute kidney failure, unspecified: Secondary | ICD-10-CM | POA: Diagnosis present

## 2017-01-28 DIAGNOSIS — R739 Hyperglycemia, unspecified: Secondary | ICD-10-CM | POA: Diagnosis present

## 2017-01-28 HISTORY — DX: Hemangioma of other sites: D18.09

## 2017-01-28 HISTORY — DX: Calculus of gallbladder without cholecystitis without obstruction: K80.20

## 2017-01-28 NOTE — ED Triage Notes (Signed)
The pt arrived by gems from home/  He reports numerus bms with blood in his bms today  He just saw his regular doctor yesterday no n or v   No pain

## 2017-01-29 ENCOUNTER — Emergency Department (HOSPITAL_COMMUNITY): Payer: Medicare HMO

## 2017-01-29 ENCOUNTER — Encounter (HOSPITAL_COMMUNITY): Payer: Self-pay | Admitting: Internal Medicine

## 2017-01-29 DIAGNOSIS — N179 Acute kidney failure, unspecified: Secondary | ICD-10-CM | POA: Diagnosis present

## 2017-01-29 DIAGNOSIS — K625 Hemorrhage of anus and rectum: Secondary | ICD-10-CM | POA: Diagnosis present

## 2017-01-29 DIAGNOSIS — R739 Hyperglycemia, unspecified: Secondary | ICD-10-CM | POA: Diagnosis present

## 2017-01-29 LAB — BASIC METABOLIC PANEL
Anion gap: 16 — ABNORMAL HIGH (ref 5–15)
BUN: 14 mg/dL (ref 6–20)
CHLORIDE: 107 mmol/L (ref 101–111)
CO2: 18 mmol/L — AB (ref 22–32)
CREATININE: 1.45 mg/dL — AB (ref 0.61–1.24)
Calcium: 9.4 mg/dL (ref 8.9–10.3)
GFR calc non Af Amer: 49 mL/min — ABNORMAL LOW (ref >60)
GFR, EST AFRICAN AMERICAN: 56 mL/min — AB (ref >60)
GLUCOSE: 141 mg/dL — AB (ref 65–99)
Potassium: 3.4 mmol/L — ABNORMAL LOW (ref 3.5–5.1)
Sodium: 141 mmol/L (ref 135–145)

## 2017-01-29 LAB — CREATININE, SERUM
CREATININE: 1.41 mg/dL — AB (ref 0.61–1.24)
GFR calc Af Amer: 58 mL/min — ABNORMAL LOW (ref >60)
GFR calc non Af Amer: 50 mL/min — ABNORMAL LOW (ref >60)

## 2017-01-29 LAB — CBC WITH DIFFERENTIAL/PLATELET
Basophils Absolute: 0 10*3/uL (ref 0.0–0.1)
Basophils Relative: 0 %
Eosinophils Absolute: 0.1 10*3/uL (ref 0.0–0.7)
Eosinophils Relative: 1 %
HEMATOCRIT: 35.8 % — AB (ref 39.0–52.0)
HEMOGLOBIN: 12.3 g/dL — AB (ref 13.0–17.0)
LYMPHS ABS: 2.8 10*3/uL (ref 0.7–4.0)
LYMPHS PCT: 21 %
MCH: 30.9 pg (ref 26.0–34.0)
MCHC: 34.4 g/dL (ref 30.0–36.0)
MCV: 89.9 fL (ref 78.0–100.0)
Monocytes Absolute: 1 10*3/uL (ref 0.1–1.0)
Monocytes Relative: 7 %
NEUTROS ABS: 9.3 10*3/uL — AB (ref 1.7–7.7)
NEUTROS PCT: 71 %
Platelets: 242 10*3/uL (ref 150–400)
RBC: 3.98 MIL/uL — ABNORMAL LOW (ref 4.22–5.81)
RDW: 13.5 % (ref 11.5–15.5)
WBC: 13.2 10*3/uL — ABNORMAL HIGH (ref 4.0–10.5)

## 2017-01-29 LAB — CBC
HEMATOCRIT: 31.3 % — AB (ref 39.0–52.0)
HEMOGLOBIN: 10.5 g/dL — AB (ref 13.0–17.0)
MCH: 30.6 pg (ref 26.0–34.0)
MCHC: 33.5 g/dL (ref 30.0–36.0)
MCV: 91.3 fL (ref 78.0–100.0)
Platelets: 217 10*3/uL (ref 150–400)
RBC: 3.43 MIL/uL — ABNORMAL LOW (ref 4.22–5.81)
RDW: 13.8 % (ref 11.5–15.5)
WBC: 14 10*3/uL — ABNORMAL HIGH (ref 4.0–10.5)

## 2017-01-29 LAB — PROTIME-INR
INR: 1.1
Prothrombin Time: 14.3 seconds (ref 11.4–15.2)

## 2017-01-29 LAB — URINALYSIS, ROUTINE W REFLEX MICROSCOPIC
Bilirubin Urine: NEGATIVE
GLUCOSE, UA: NEGATIVE mg/dL
HGB URINE DIPSTICK: NEGATIVE
Ketones, ur: 5 mg/dL — AB
Leukocytes, UA: NEGATIVE
Nitrite: NEGATIVE
PH: 5 (ref 5.0–8.0)
Protein, ur: NEGATIVE mg/dL

## 2017-01-29 LAB — POC OCCULT BLOOD, ED: Fecal Occult Bld: POSITIVE — AB

## 2017-01-29 LAB — TYPE AND SCREEN
ABO/RH(D): B POS
ANTIBODY SCREEN: NEGATIVE

## 2017-01-29 LAB — MAGNESIUM: Magnesium: 1.6 mg/dL — ABNORMAL LOW (ref 1.7–2.4)

## 2017-01-29 LAB — PHOSPHORUS: PHOSPHORUS: 1.2 mg/dL — AB (ref 2.5–4.6)

## 2017-01-29 MED ORDER — ACETAMINOPHEN 325 MG PO TABS
650.0000 mg | ORAL_TABLET | Freq: Four times a day (QID) | ORAL | Status: DC | PRN
  Administered 2017-01-29: 650 mg via ORAL
  Filled 2017-01-29: qty 2

## 2017-01-29 MED ORDER — HEPARIN SODIUM (PORCINE) 5000 UNIT/ML IJ SOLN: 5000.0000 [IU] | Freq: Three times a day (TID) | INTRAMUSCULAR | Status: DC

## 2017-01-29 MED ORDER — ONDANSETRON HCL 4 MG/2ML IJ SOLN: 4.0000 mg | Freq: Four times a day (QID) | INTRAMUSCULAR | Status: DC | PRN

## 2017-01-29 MED ORDER — SODIUM CHLORIDE 0.9 % IV SOLN
INTRAVENOUS | Status: AC
  Administered 2017-01-29: 08:00:00 via INTRAVENOUS

## 2017-01-29 MED ORDER — AMLODIPINE BESYLATE 10 MG PO TABS
10.0000 mg | ORAL_TABLET | Freq: Every day | ORAL | Status: DC
  Administered 2017-01-29: 10 mg via ORAL
  Filled 2017-01-29 (×2): qty 1

## 2017-01-29 MED ORDER — IOPAMIDOL (ISOVUE-300) INJECTION 61%
INTRAVENOUS | Status: AC
  Administered 2017-01-29: 100 mL
  Filled 2017-01-29: qty 100

## 2017-01-29 MED ORDER — FENTANYL CITRATE (PF) 100 MCG/2ML IJ SOLN
25.0000 ug | Freq: Once | INTRAMUSCULAR | Status: AC
  Administered 2017-01-29: 25 ug via INTRAVENOUS
  Filled 2017-01-29: qty 2

## 2017-01-29 MED ORDER — KETOROLAC TROMETHAMINE 15 MG/ML IJ SOLN: 15.0000 mg | Freq: Four times a day (QID) | INTRAMUSCULAR | Status: DC | PRN

## 2017-01-29 MED ORDER — HYDRALAZINE HCL 20 MG/ML IJ SOLN: 5.0000 mg | INTRAMUSCULAR | Status: DC | PRN

## 2017-01-29 MED ORDER — SODIUM CHLORIDE 0.9% FLUSH
3.0000 mL | Freq: Two times a day (BID) | INTRAVENOUS | Status: DC
  Administered 2017-01-29 – 2017-01-30 (×2): 3 mL via INTRAVENOUS

## 2017-01-29 MED ORDER — CARVEDILOL 25 MG PO TABS
25.0000 mg | ORAL_TABLET | Freq: Two times a day (BID) | ORAL | Status: DC
  Administered 2017-01-29 – 2017-01-30 (×3): 25 mg via ORAL
  Filled 2017-01-29 (×3): qty 1

## 2017-01-29 MED ORDER — MAGNESIUM SULFATE 2 GM/50ML IV SOLN
2.0000 g | Freq: Once | INTRAVENOUS | Status: AC
  Administered 2017-01-29: 2 g via INTRAVENOUS
  Filled 2017-01-29: qty 50

## 2017-01-29 MED ORDER — POTASSIUM CHLORIDE CRYS ER 20 MEQ PO TBCR
40.0000 meq | EXTENDED_RELEASE_TABLET | Freq: Once | ORAL | Status: AC
  Administered 2017-01-29: 40 meq via ORAL
  Filled 2017-01-29: qty 2

## 2017-01-29 MED ORDER — BOOST / RESOURCE BREEZE PO LIQD
1.0000 | Freq: Three times a day (TID) | ORAL | Status: DC
  Administered 2017-01-29 – 2017-01-30 (×4): 1 via ORAL

## 2017-01-29 MED ORDER — ONDANSETRON HCL 4 MG PO TABS: 4.0000 mg | ORAL_TABLET | Freq: Four times a day (QID) | ORAL | Status: DC | PRN

## 2017-01-29 MED ORDER — HYDROCORTISONE ACETATE 25 MG RE SUPP
25.0000 mg | Freq: Two times a day (BID) | RECTAL | Status: DC
  Filled 2017-01-29 (×2): qty 1

## 2017-01-29 MED ORDER — ACETAMINOPHEN 650 MG RE SUPP: 650.0000 mg | Freq: Four times a day (QID) | RECTAL | Status: DC | PRN

## 2017-01-29 MED ORDER — DOXAZOSIN MESYLATE 2 MG PO TABS
2.0000 mg | ORAL_TABLET | Freq: Every day | ORAL | Status: DC
  Administered 2017-01-29: 2 mg via ORAL
  Filled 2017-01-29: qty 1

## 2017-01-29 NOTE — ED Notes (Signed)
Pain med given 

## 2017-01-29 NOTE — ED Provider Notes (Signed)
[facility] DEPT Provider Note   CSN: [id number] Arrival date & time: 01/28/17  2343     History   Chief Complaint Chief Complaint  Patient presents with  . Rectal Bleeding    HPI Alexis [name] is a 66 y.o. male.  Patient presents to the ED with a chief complaint of rectal bleeding.  He states that he noticed the symptoms this evening at 6 pm.  He reports that he has had several dark bloody bowel movements since then. He is not anticoagulated. He reports some associated abdominal cramping which moves from his left lower abdomen to his right lower abdomen.  He denies any fevers, chills, nausea, or vomiting.  Denies any chest pain, SOB, or dizziness.  There are no other associated symptoms or modifying factors.  Last reported colonoscopy was several years ago performed by the [place].   The history is provided by the patient. No language interpreter was used.    Past Medical History:  Diagnosis Date  . Chronic systolic CHF (congestive heart failure) ([place])   . Depression   . GERD (gastroesophageal reflux disease)   . Headache   . Hepatitis C   . Hypertension   . Mitral regurgitation   . Nephrolithiasis   . Protein calorie malnutrition ([place])   . S/P aortic valve replacement with bioprosthetic valve 03/18/2016   27 mm [facility] Ease bovine pericardial tissue valve  . Seizure ([place])   . Severe aortic regurgitation   . Shortness of breath dyspnea    LYING FLAT   . 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 Tricuspid regurgitation     Patient Active Problem List   Diagnosis Date Noted  . S/P aortic valve replacement with bioprosthetic valve 03/18/2016  . Chest pain 03/01/2016  . Dental caries 02/25/2016  . Chronic periodontitis 02/25/2016  . Tricuspid regurgitation   . Severe aortic regurgitation 02/11/2016  . CAD in native artery 02/10/2016  . Postoperative groin pseudoaneurysm ([place]) 02/10/2016  . Aortic regurgitation   . Troponin level elevated   . Pulmonary hypertension  ([place]) 02/05/2016  . History of CVA (cerebrovascular accident) 02/05/2016  . Hypokalemia 02/05/2016  . Marijuana abuse 02/05/2016  . Protein-calorie malnutrition, severe 10/17/2015  . Acute embolic stroke ([place])   . Cerebral thrombosis with cerebral infarction 10/16/2015  . Essential hypertension   . Acute respiratory failure with hypoxia ([place]) 10/15/2015  . Acute on chronic systolic CHF (congestive heart failure) ([place]) 10/15/2015  . Nausea vomiting and diarrhea 10/15/2015  . Loss of weight 10/15/2015  . Chronic hepatitis C without hepatic coma ([place])   . Chronic cluster headache, not intractable     Past Surgical History:  Procedure Laterality Date  . AORTIC VALVE REPLACEMENT N/A 03/18/2016   Procedure: AORTIC VALVE REPLACEMENT (AVR) implanted magna ease 49mm;  Surgeon: [name], MD;  Location: [facility];  Service: Open Heart Surgery;  Laterality: N/A;  . CARDIAC CATHETERIZATION N/A 02/09/2016   Procedure: Right/Left Heart Cath and Coronary Angiography;  Surgeon: [name], MD;  Location: [facility];  Service: Cardiovascular;  Laterality: N/A;  . FRACTURE SURGERY     RT ARM  . LEGS     BIL FX   . LITHOTRIPSY    . MULTIPLE EXTRACTIONS WITH ALVEOLOPLASTY N/A 03/01/2016   Procedure: Extraction of tooth #'s 5,7,84,69,62,95,28 with alveoloplasty and gross debridement of remaining teeth.;  Surgeon: [name], DDS;  Location: [facility];  Service: Oral Surgery;  Laterality: N/A;  . ROTATOR CUFF REPAIR    .  TEE WITHOUT CARDIOVERSION N/A 02/11/2016   Procedure: TRANSESOPHAGEAL ECHOCARDIOGRAM (TEE);  Surgeon: [name], MD;  Location: [facility];  Service: Cardiovascular;  Laterality: N/A;  . TEE WITHOUT CARDIOVERSION N/A 03/18/2016   Procedure: TRANSESOPHAGEAL ECHOCARDIOGRAM (TEE);  Surgeon: [name], MD;  Location: [facility];  Service: Open Heart Surgery;  Laterality: N/A;       Home Medications    Prior to Admission medications   Medication Sig Start Date End  Date Taking? Authorizing Provider  amLODipine (NORVASC) 10 MG tablet Take 1 tablet (10 mg total) by mouth daily. 03/23/16   Nani Skillern, PA-C  amoxicillin (AMOXIL) 500 MG tablet 4 TABLETS BY MOUTH 1 HOUR PRIOR TO PROCEDURE 05/14/16   [name], MD  aspirin EC 325 MG EC tablet Take 1 tablet (325 mg total) by mouth daily. 02/11/16   Velvet Bathe, MD  atorvastatin (LIPITOR) 10 MG tablet Take 1 tablet (10 mg total) by mouth daily at 6 PM. 10/18/15   Mikhail, Velta Addison, DO  butalbital-aspirin-caffeine [facility]) [phone] MG capsule Take 1 capsule by mouth every 6 (six) hours as needed for headache.    [provider]  carvedilol (COREG) 25 MG tablet Take 1 tablet (25 mg total) by mouth 2 (two) times daily. 04/05/16   [name], MD  doxazosin (CARDURA) 2 MG tablet Take 1 tablet (2 mg total) by mouth at bedtime. 05/14/16   [name], MD  lisinopril (PRINIVIL,ZESTRIL) 20 MG tablet Take 20 mg by mouth daily.    [provider]    Family History Family History  Problem Relation Age of Onset  . Breast cancer Mother   . Aneurysm Mother        Brain aneurysm  . Hypertension Father   . Cancer Father        Pancreatic cancer  . Hypertension Brother     Social History Social History  Substance Use Topics  . Smoking status: Former Smoker    Packs/day: 0.50    Years: 6.00    Types: Cigarettes    Quit date: 09/07/1971  . Smokeless tobacco: Former Systems developer    Types: Chew  . Alcohol use 0.0 oz/week     Comment: occl     Allergies   No known allergies   Review of Systems Review of Systems  All other systems reviewed and are negative.    Physical Exam Updated Vital Signs BP (!) 128/103 (BP Location: Right Arm)   Pulse 85   Temp 97.7 F (36.5 C) (Oral)   Resp (!) 26   SpO2 100%   Physical Exam  Constitutional: He is oriented to person, place, and time. He appears well-developed and well-nourished.  HENT:  Head: Normocephalic and atraumatic.    Eyes: Conjunctivae and EOM are normal. Pupils are equal, round, and reactive to light. Right eye exhibits no discharge. Left eye exhibits no discharge. No scleral icterus.  Neck: Normal range of motion. Neck supple. No JVD present.  Cardiovascular: Normal rate, regular rhythm and normal heart sounds.  Exam reveals no gallop and no friction rub.   No murmur heard. Pulmonary/Chest: Effort normal and breath sounds normal. No respiratory distress. He has no wheezes. He has no rales. He exhibits no tenderness.  Abdominal: Soft. He exhibits no distension and no mass. There is no tenderness. There is no rebound and no guarding.  Genitourinary:  Genitourinary Comments: Chaperone present for DRE No gross blood on exam, but there is a small amount of blood on underwear, no  hemorrhoids or visible fissures  Musculoskeletal: Normal range of motion. He exhibits no edema or tenderness.  Neurological: He is alert and oriented to person, place, and time.  Skin: Skin is warm and dry.  Psychiatric: He has a normal mood and affect. His behavior is normal. Judgment and thought content normal.  Nursing note and vitals reviewed.    ED Treatments / Results  Labs (all labs ordered are listed, but only abnormal results are displayed) Labs Reviewed  CBC WITH DIFFERENTIAL/PLATELET - Abnormal; Notable for the following:       Result Value   WBC 13.2 (*)    RBC 3.98 (*)    Hemoglobin 12.3 (*)    HCT 35.8 (*)    Neutro Abs 9.3 (*)    All other components within normal limits  BASIC METABOLIC PANEL - Abnormal; Notable for the following:    Potassium 3.4 (*)    CO2 18 (*)    Glucose, Bld 141 (*)    Creatinine, Ser 1.45 (*)    GFR calc non Af Amer 49 (*)    GFR calc Af Amer 56 (*)    Anion gap 16 (*)    All other components within normal limits  POC OCCULT BLOOD, ED - Abnormal; Notable for the following:    Fecal Occult Bld POSITIVE (*)    All other components within normal limits  OCCULT BLOOD X 1 CARD TO  LAB, STOOL  TYPE AND SCREEN    EKG  EKG Interpretation None       Radiology Ct Abdomen Pelvis W Contrast  Result Date: 01/29/2017 CLINICAL DATA:  Left lower quadrant pain with rectal bleeding. History of nephrolithiasis, hepatitis-C, gastroesophageal reflux disease. EXAM: CT ABDOMEN AND PELVIS WITH CONTRAST TECHNIQUE: Multidetector CT imaging of the abdomen and pelvis was performed using the standard protocol following bolus administration of intravenous contrast. CONTRAST:  122mL ISOVUE-300 IOPAMIDOL (ISOVUE-300) INJECTION 61% COMPARISON:  10/17/2015 FINDINGS: Lower chest: Postoperative changes in the mediastinum. Aortic valve prosthesis. Lung bases are clear. Hepatobiliary: Cholelithiasis with several large stones in the gallbladder. No gallbladder wall thickening or edema. No bile duct dilatation. No focal liver lesions. Pancreas: Unremarkable. No pancreatic ductal dilatation or surrounding inflammatory changes. Spleen: Normal in size without focal abnormality. Adrenals/Urinary Tract: No adrenal gland nodules. Bilateral renal parenchymal cysts. Nephrograms are symmetrical. No hydronephrosis or hydroureter. Bladder wall is not thickened. Stomach/Bowel: Stomach, small bowel, and colon are mostly decompressed. No inflammatory infiltration is identified. Appendix is normal. Vascular/Lymphatic: Aortic atherosclerosis. No enlarged abdominal or pelvic lymph nodes. Reproductive: Prostate is unremarkable. Other: No abdominal wall hernia or abnormality. No abdominopelvic ascites. Musculoskeletal: No acute or significant osseous findings. IMPRESSION: Cholelithiasis without changes of cholecystitis. No evidence of bowel obstruction or inflammation. No renal or ureteral obstruction. Electronically Signed   By: Lucienne Capers M.D.   On: 01/29/2017 04:15    Procedures Procedures (including critical care time)  Medications Ordered in ED Medications - No data to display   Initial Impression / Assessment  and Plan / ED Course  I have reviewed the triage vital signs and the nursing notes.  Pertinent labs & imaging results that were available during my care of the patient were reviewed by me and considered in my medical decision making (see chart for details).     Patient with GI bleed.  VSS.  Will check H/H.  He is not anticoagulated.  No gross blood on exam.  He does have some pain with palpation in LLQ.  CT is  negative for diverticulitis.  Patient does have cholelithiasis, but no evidence of cholecystitis. No fevers, chills, nausea, vomiting.    PCP is with the [facility].    6:32 AM Discussed patient with Dr. Kathrynn Humble, who recommends admission given that patient had an episode of bloody stool several hours ago.     Appreciate Dr. Myna Hidalgo for bringing patient into the hospital.  Final Clinical Impressions(s) / ED Diagnoses   Final diagnoses:  Rectal bleeding    New Prescriptions New Prescriptions   No medications on file     Montine Circle, Hershal Coria 01/29/17 [place], [place], MD 02/01/17 2200

## 2017-01-29 NOTE — ED Notes (Signed)
To ct

## 2017-01-29 NOTE — ED Notes (Signed)
The pt is hyperventilating

## 2017-01-29 NOTE — ED Notes (Signed)
The pt is c/o abd pain and he keeps saying that no body has seen him  But people have been coming in and out.. He appears generally disgruntled  His respirations have slowed down

## 2017-01-29 NOTE — ED Notes (Signed)
Nauseated and attempting to vomit c/o abd pain,

## 2017-01-29 NOTE — Consult Note (Signed)
Referring Provider:  Dr. Aggie Moats  Primary Care Physician:  System, Pcp Not In Primary Gastroenterologist:  Unassigned/ follows at  [facility]  Reason for Consultation:  Rectal bleeding  HPI: Alexis [name] is a 66 y.o. male with past medical history of chronic systolic CHF, aortic insufficiency status post aortic valve replacement in 2017, history of hepatitis C and seizure disorder presented to the emergency department with the rectal bleeding of 1 day duration.  According to patient, he was watching television yesterday evening when he felt desire to go for a bowel movement. Patient noted large amount of bright blood along with clots in the commode. Patient had multiple episodes since then. Patient did had some abdominal discomfort. Because of ongoing rectal bleeding he came into the ER. Patient had some nausea but denied any vomiting. The abdominal discomfort is improving. Denied any fever or chills. Denied any weight loss.  He has been having on and off bleeding since many years. Had colonoscopy around 1 to 2 years ago at [facility] which was unremarkable per patient.  No family history of colon cancer  Past Medical History:  Diagnosis Date  . Chronic systolic CHF (congestive heart failure) ([place])   . Depression   . GERD (gastroesophageal reflux disease)   . Headache   . Hepatitis C   . Hypertension   . Mitral regurgitation   . Nephrolithiasis   . Protein calorie malnutrition ([place])   . S/P aortic valve replacement with bioprosthetic valve 03/18/2016   27 mm [facility] Ease bovine pericardial tissue valve  . Seizure ([place])   . Severe aortic regurgitation   . Shortness of breath dyspnea    LYING FLAT   . 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 Tricuspid regurgitation     Past Surgical History:  Procedure Laterality Date  . AORTIC VALVE REPLACEMENT N/A 03/18/2016   Procedure: AORTIC VALVE REPLACEMENT (AVR) implanted magna ease 47mm;  Surgeon: [name], MD;  Location: [facility];  Service: Open Heart Surgery;  Laterality: N/A;  . CARDIAC CATHETERIZATION N/A 02/09/2016   Procedure: Right/Left Heart Cath and Coronary Angiography;  Surgeon: [name], MD;  Location: [facility];  Service: Cardiovascular;  Laterality: N/A;  . FRACTURE SURGERY     RT ARM  . LEGS     BIL FX   . LITHOTRIPSY    . MULTIPLE EXTRACTIONS WITH ALVEOLOPLASTY N/A 03/01/2016   Procedure: Extraction of tooth #'s 6,0,73,71,06,26,94 with alveoloplasty and gross debridement of remaining teeth.;  Surgeon: [name], DDS;  Location: [facility];  Service: Oral Surgery;  Laterality: N/A;  . ROTATOR CUFF REPAIR    . TEE WITHOUT CARDIOVERSION N/A 02/11/2016   Procedure: TRANSESOPHAGEAL ECHOCARDIOGRAM (TEE);  Surgeon: [name], MD;  Location: [facility];  Service: Cardiovascular;  Laterality: N/A;  . TEE WITHOUT CARDIOVERSION N/A 03/18/2016   Procedure: TRANSESOPHAGEAL ECHOCARDIOGRAM (TEE);  Surgeon: [name], MD;  Location: [facility];  Service: Open Heart Surgery;  Laterality: N/A;    Prior to Admission medications   Medication Sig Start Date End Date Taking? Authorizing Provider  amLODipine (NORVASC) 10 MG tablet Take 1 tablet (10 mg total) by mouth daily. 03/23/16  Yes Tacy Dura, Donielle M, PA-C  amoxicillin (AMOXIL) 500 MG tablet 4 TABLETS BY MOUTH 1 HOUR PRIOR TO PROCEDURE Patient taking differently: Take 2,000 mg by mouth See admin instructions. 4 TABLETS BY MOUTH 1 HOUR PRIOR TO PROCEDURE 05/14/16  Yes [name], MD  aspirin EC 325 MG  EC tablet Take 1 tablet (325 mg total) by mouth daily. 02/11/16  Yes Velvet Bathe, MD  atorvastatin (LIPITOR) 10 MG tablet Take 1 tablet (10 mg total) by mouth daily at 6 PM. 10/18/15  Yes Mikhail, [place], DO  butalbital-aspirin-caffeine [facility]) [phone] MG capsule Take 1 capsule by mouth every 6 (six) hours as needed for headache.   Yes [provider]  carvedilol (COREG) 25 MG tablet Take 1 tablet (25 mg total) by mouth 2 (two)  times daily. 04/05/16  Yes [name], MD  doxazosin (CARDURA) 2 MG tablet Take 1 tablet (2 mg total) by mouth at bedtime. 05/14/16  Yes [name], MD  lisinopril (PRINIVIL,ZESTRIL) 20 MG tablet Take 20 mg by mouth daily.   Yes [provider]  promethazine (PHENERGAN) 25 MG tablet Take 25 mg by mouth every 6 (six) hours as needed for nausea or vomiting.   Yes [provider]  SUMAtriptan (IMITREX) 100 MG tablet Take 100 mg by mouth every 2 (two) hours as needed for migraine. May repeat in 2 hours if headache persists or recurs.   Yes [provider]    Scheduled Meds: . amLODipine  10 mg Oral Daily  . carvedilol  25 mg Oral BID WC  . doxazosin  2 mg Oral QHS  . feeding supplement  1 Container Oral TID BM  . potassium chloride  40 mEq Oral Once  . sodium chloride flush  3 mL Intravenous Q12H   Continuous Infusions: . sodium chloride 50 mL/hr at 01/29/17 0744  . magnesium sulfate 1 - 4 g bolus IVPB     PRN Meds:.acetaminophen **OR** acetaminophen, hydrALAZINE, ondansetron **OR** ondansetron (ZOFRAN) IV  Allergies as of 01/28/2017 - Review Complete 01/28/2017  Allergen Reaction Noted  . No known allergies  03/17/2016    Family History  Problem Relation Age of Onset  . Breast cancer Mother   . Aneurysm Mother        Brain aneurysm  . Hypertension Father   . Cancer Father        Pancreatic cancer  . Hypertension Brother     Social History   Social History  . Marital status: Married    Spouse name: N/A  . Number of children: 3  . Years of education: N/A   Occupational History  . Retired     [employer]   Social History Main Topics  . Smoking status: Former Smoker    Packs/day: 0.50    Years: 6.00    Types: Cigarettes    Quit date: 09/07/1971  . Smokeless tobacco: Former Systems developer    Types: Chew  . Alcohol use 0.0 oz/week     Comment: occl  . Drug use: Yes    Types: Marijuana     Comment: THC      LAST USED WAS LAST MONTH    .  Sexual activity: Not on file   Other Topics Concern  . Not on file   Social History Narrative  . No narrative on file    Review of Systems: Review of Systems  Constitutional: Negative for chills, fever, malaise/fatigue and weight loss.  HENT: Negative for ear discharge, ear pain, hearing loss and tinnitus.   Eyes: Negative for blurred vision, double vision, photophobia and pain.  Respiratory: Negative for cough, hemoptysis and sputum production.   Cardiovascular: Negative for chest pain and palpitations.  Gastrointestinal: Positive for abdominal pain, blood in stool and nausea. Negative for melena and vomiting.  Genitourinary: Negative for dysuria, frequency  and urgency.  Musculoskeletal: Negative for myalgias and neck pain.  Skin: Negative for rash.  Neurological: Negative for sensory change, speech change and focal weakness.  Endo/Heme/Allergies: Does not bruise/bleed easily.  Psychiatric/Behavioral: Negative for hallucinations and suicidal ideas.    Physical Exam: Vital signs: Vitals:   01/29/17 0700 01/29/17 0730  BP: (!) 135/102 (!) 150/107  Pulse: 87 99  Resp: (!) 37   Temp:     Last BM Date: 01/29/17 Physical Exam  Constitutional: He is oriented to person, place, and time. He appears well-developed and well-nourished. No distress.  HENT:  Head: Normocephalic and atraumatic.  Nose: Nose normal.  Mouth/Throat: Oropharynx is clear and moist. No oropharyngeal exudate.  Eyes: EOM are normal. No scleral icterus.  Neck: Normal range of motion. Neck supple.  Cardiovascular: Normal rate, regular rhythm and normal heart sounds.   Pulmonary/Chest: Effort normal and breath sounds normal. No respiratory distress.  Abdominal: Soft. Bowel sounds are normal. He exhibits no distension. There is no rebound and no guarding.  Bilateral lower quadrant tenderness to palpation  Musculoskeletal: Normal range of motion. He exhibits no edema.  Neurological: He is alert and oriented to  person, place, and time.  Skin: Skin is warm. No erythema.  Psychiatric: He has a normal mood and affect. His behavior is normal. Thought content normal.    GI:  Lab Results:  Recent Labs  01/29/17 0059  WBC 13.2*  HGB 12.3*  HCT 35.8*  PLT 242   BMET  Recent Labs  01/29/17 0055 01/29/17 0059  NA  --  141  K  --  3.4*  CL  --  107  CO2  --  18*  GLUCOSE  --  141*  BUN  --  14  CREATININE 1.41* 1.45*  CALCIUM  --  9.4   LFT No results for input(s): PROT, ALBUMIN, AST, ALT, ALKPHOS, BILITOT, BILIDIR, IBILI in the last 72 hours. PT/INR No results for input(s): LABPROT, INR in the last 72 hours.   Studies/Results: Ct Abdomen Pelvis W Contrast  Result Date: 01/29/2017 CLINICAL DATA:  Left lower quadrant pain with rectal bleeding. History of nephrolithiasis, hepatitis-C, gastroesophageal reflux disease. EXAM: CT ABDOMEN AND PELVIS WITH CONTRAST TECHNIQUE: Multidetector CT imaging of the abdomen and pelvis was performed using the standard protocol following bolus administration of intravenous contrast. CONTRAST:  172mL ISOVUE-300 IOPAMIDOL (ISOVUE-300) INJECTION 61% COMPARISON:  10/17/2015 FINDINGS: Lower chest: Postoperative changes in the mediastinum. Aortic valve prosthesis. Lung bases are clear. Hepatobiliary: Cholelithiasis with several large stones in the gallbladder. No gallbladder wall thickening or edema. No bile duct dilatation. No focal liver lesions. Pancreas: Unremarkable. No pancreatic ductal dilatation or surrounding inflammatory changes. Spleen: Normal in size without focal abnormality. Adrenals/Urinary Tract: No adrenal gland nodules. Bilateral renal parenchymal cysts. Nephrograms are symmetrical. No hydronephrosis or hydroureter. Bladder wall is not thickened. Stomach/Bowel: Stomach, small bowel, and colon are mostly decompressed. No inflammatory infiltration is identified. Appendix is normal. Vascular/Lymphatic: Aortic atherosclerosis. No enlarged abdominal or  pelvic lymph nodes. Reproductive: Prostate is unremarkable. Other: No abdominal wall hernia or abnormality. No abdominopelvic ascites. Musculoskeletal: No acute or significant osseous findings. IMPRESSION: Cholelithiasis without changes of cholecystitis. No evidence of bowel obstruction or inflammation. No renal or ureteral obstruction. Electronically Signed   By: Lucienne Capers M.D.   On: 01/29/2017 04:15    Impression/Plan: - Bright red blood per rectum. No bowel movement this morning. Abdominal pain resolved. No globin 12.3. Prior hemoglobin in July was 9.6. - Bilateral lower quadrant Abdominal  pain. Improving. CT scan negative for any acute changes. - History of hepatitis C.  Recommendations ------------------------- - Patient has no further bleeding this morning. Vital signs stable. Hemoglobin actually better than his previous baseline. - Recent colonoscopy around 1 to 2  years ago at [facility] which was unremarkable, per patient. I do not have a report available to review - Monitor hemoglobin. If his bleeding resolves, consider outpatient workup. - GI will follow    LOS: 0 days   Otis Brace  MD, FACP 01/29/2017, 10:50 AM  Pager [phone] If no answer or after 5 PM call [phone]

## 2017-01-29 NOTE — ED Notes (Signed)
Pt requesting more pain medication at this time.  

## 2017-01-29 NOTE — ED Notes (Signed)
Alexis [name] niece  Number [phone]

## 2017-01-29 NOTE — H&P (Signed)
History and Physical    Alexis [name] ZHY:[id number] DOB: [birth year] DOA: 01/28/2017  PCP: System, Pcp Not In  Patient coming from: home  Chief Complaint: rectal bleeding  HPI: Alexis [name] is a 66 y.o. male with medical history significant chronic systolic heart failure, aortic insufficiency status post valve replacement 2017, hypertension, seizure disorder, protein calorie malnutrition, nephrolithiasis, hepatitis C, GERD presents to emergency Department chief complaint of rectal bleeding. Initial evaluation concerning for GI bleed  Information is obtained from the patient. He states he was in his usual state of health until yesterday evening he started having dark bloody bowel movements. He states he went "all night long" he reports initially he experienced large amounts of bright red blood per his rectum with clots. As the episodes continued the blood became maroon in color and there was less stool. Associated symptoms include some abdominal cramping hit his left lower abdomen. He denies nausea vomiting. He denies any fever chills headache dizziness syncope or near-syncope. He denies chest pain shortness of breath lower extremity edema. He denies dysuria hematuria frequency or urgency. In the emergency department he had one episode of bloody stool.   ED Course: In the emergency department he's afebrile and hypertensive. Has episodes of tachypnea is not hypoxic  Review of Systems: As per HPI otherwise all other systems reviewed and are negative.   Ambulatory Status ambulates independently.  Past Medical History:  Diagnosis Date  . Chronic systolic CHF (congestive heart failure) ([place])   . Depression   . GERD (gastroesophageal reflux disease)   . Headache   . Hepatitis C   . Hypertension   . Mitral regurgitation   . Nephrolithiasis   . Protein calorie malnutrition ([place])   . S/P aortic valve replacement with bioprosthetic valve 03/18/2016   27 mm [facility] Ease bovine pericardial  tissue valve  . Seizure ([place])   . Severe aortic regurgitation   . Shortness of breath dyspnea    LYING FLAT   . 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 Tricuspid regurgitation     Past Surgical History:  Procedure Laterality Date  . AORTIC VALVE REPLACEMENT N/A 03/18/2016   Procedure: AORTIC VALVE REPLACEMENT (AVR) implanted magna ease 3mm;  Surgeon: [name], MD;  Location: [place];  Service: Open Heart Surgery;  Laterality: N/A;  . CARDIAC CATHETERIZATION N/A 02/09/2016   Procedure: Right/Left Heart Cath and Coronary Angiography;  Surgeon: [name], MD;  Location: [place] [facility];  Service: Cardiovascular;  Laterality: N/A;  . FRACTURE SURGERY     RT ARM  . LEGS     BIL FX   . LITHOTRIPSY    . MULTIPLE EXTRACTIONS WITH ALVEOLOPLASTY N/A 03/01/2016   Procedure: Extraction of tooth #'s 2,9,52,84,13,24,40 with alveoloplasty and gross debridement of remaining teeth.;  Surgeon: [name], DDS;  Location: [place];  Service: Oral Surgery;  Laterality: N/A;  . ROTATOR CUFF REPAIR    . TEE WITHOUT CARDIOVERSION N/A 02/11/2016   Procedure: TRANSESOPHAGEAL ECHOCARDIOGRAM (TEE);  Surgeon: [name], MD;  Location: [place];  Service: Cardiovascular;  Laterality: N/A;  . TEE WITHOUT CARDIOVERSION N/A 03/18/2016   Procedure: TRANSESOPHAGEAL ECHOCARDIOGRAM (TEE);  Surgeon: [name], MD;  Location: [place];  Service: Open Heart Surgery;  Laterality: N/A;    Social History   Social History  . Marital status: Married    Spouse name: N/A  . Number of children: 3  . Years of education: N/A   Occupational History  . Retired  [employer]   Social History Main Topics  . Smoking status: Former Smoker    Packs/day: 0.50    Years: 6.00    Types: Cigarettes    Quit date: 09/07/1971  . Smokeless tobacco: Former Systems developer    Types: Chew  . Alcohol use 0.0 oz/week     Comment: occl  . Drug use: Yes    Types: Marijuana     Comment: THC      LAST USED WAS LAST MONTH    .  Sexual activity: Not on file   Other Topics Concern  . Not on file   Social History Narrative  . No narrative on file    Allergies  Allergen Reactions  . No Known Allergies     Family History  Problem Relation Age of Onset  . Breast cancer Mother   . Aneurysm Mother        Brain aneurysm  . Hypertension Father   . Cancer Father        Pancreatic cancer  . Hypertension Brother     Prior to Admission medications   Medication Sig Start Date End Date Taking? Authorizing Provider  amLODipine (NORVASC) 10 MG tablet Take 1 tablet (10 mg total) by mouth daily. 03/23/16  Yes Tacy Dura, Donielle M, PA-C  amoxicillin (AMOXIL) 500 MG tablet 4 TABLETS BY MOUTH 1 HOUR PRIOR TO PROCEDURE Patient taking differently: Take 2,000 mg by mouth See admin instructions. 4 TABLETS BY MOUTH 1 HOUR PRIOR TO PROCEDURE 05/14/16  Yes [name], MD  aspirin EC 325 MG EC tablet Take 1 tablet (325 mg total) by mouth daily. 02/11/16  Yes Velvet Bathe, MD  atorvastatin (LIPITOR) 10 MG tablet Take 1 tablet (10 mg total) by mouth daily at 6 PM. 10/18/15  Yes Mikhail, [place], DO  butalbital-aspirin-caffeine [facility]) [phone] MG capsule Take 1 capsule by mouth every 6 (six) hours as needed for headache.   Yes [provider]  carvedilol (COREG) 25 MG tablet Take 1 tablet (25 mg total) by mouth 2 (two) times daily. 04/05/16  Yes [name], MD  doxazosin (CARDURA) 2 MG tablet Take 1 tablet (2 mg total) by mouth at bedtime. 05/14/16  Yes [name], MD  lisinopril (PRINIVIL,ZESTRIL) 20 MG tablet Take 20 mg by mouth daily.   Yes [provider]  promethazine (PHENERGAN) 25 MG tablet Take 25 mg by mouth every 6 (six) hours as needed for nausea or vomiting.   Yes [provider]  SUMAtriptan (IMITREX) 100 MG tablet Take 100 mg by mouth every 2 (two) hours as needed for migraine. May repeat in 2 hours if headache persists or recurs.   Yes [provider]    Physical  Exam: Vitals:   01/29/17 0515 01/29/17 0630 01/29/17 0730 01/29/17 0803  BP: 108/88 (!) 120/95 (!) 150/107   Pulse: 93 91 99   Resp: (!) 32 (!) 30    Temp:      TempSrc:      SpO2: 100% 100% 100%   Weight:    57.2 kg (126 lb 1.7 oz)  Height:    5\' 5"  (1.651 m)     General:  Appears calm and comfortable, sitting up in bed watching TV Eyes:  PERRL, EOMI, normal lids, iris ENT:  grossly normal hearing, lips & tongue, because membranes of his mouth pink somewhat dry poor dentition Neck:  no LAD, masses or thyromegaly Cardiovascular:  RRR, no m/r/g. No LE edema.  Respiratory:  CTA bilaterally, no w/r/r. Normal respiratory  effort. Abdomen:  soft, ntnd, positive bowel sounds throughout no guarding or rebounding Skin:  no rash or induration seen on limited exam Musculoskeletal:  grossly normal tone BUE/BLE, good ROM, no bony abnormality Psychiatric:  grossly normal mood and affect, speech fluent and appropriate, AOx3 Neurologic:  CN 2-12 grossly intact, moves all extremities in coordinated fashion, sensation intact speech clear facial symmetry  Labs on Admission: I have personally reviewed following labs and imaging studies  CBC:  Recent Labs Lab 01/29/17 0059  WBC 13.2*  NEUTROABS 9.3*  HGB 12.3*  HCT 35.8*  MCV 89.9  PLT 195   Basic Metabolic Panel:  Recent Labs Lab 01/29/17 0055 01/29/17 0059  NA  --  141  K  --  3.4*  CL  --  107  CO2  --  18*  GLUCOSE  --  141*  BUN  --  14  CREATININE 1.41* 1.45*  CALCIUM  --  9.4  MG 1.6*  --   PHOS 1.2*  --    GFR: Estimated Creatinine Clearance: 40.5 mL/min (A) (by C-G formula based on SCr of 1.45 mg/dL (H)). Liver Function Tests: No results for input(s): AST, ALT, ALKPHOS, BILITOT, PROT, ALBUMIN in the last 168 hours. No results for input(s): LIPASE, AMYLASE in the last 168 hours. No results for input(s): AMMONIA in the last 168 hours. Coagulation Profile: No results for input(s): INR, PROTIME in the last 168  hours. Cardiac Enzymes: No results for input(s): CKTOTAL, CKMB, CKMBINDEX, TROPONINI in the last 168 hours. BNP (last 3 results) No results for input(s): PROBNP in the last 8760 hours. HbA1C: No results for input(s): HGBA1C in the last 72 hours. CBG: No results for input(s): GLUCAP in the last 168 hours. Lipid Profile: No results for input(s): CHOL, HDL, LDLCALC, TRIG, CHOLHDL, LDLDIRECT in the last 72 hours. Thyroid Function Tests: No results for input(s): TSH, T4TOTAL, FREET4, T3FREE, THYROIDAB in the last 72 hours. Anemia Panel: No results for input(s): VITAMINB12, FOLATE, FERRITIN, TIBC, IRON, RETICCTPCT in the last 72 hours. Urine analysis:    Component Value Date/Time   COLORURINE YELLOW 03/16/2016 1419   APPEARANCEUR CLEAR 03/16/2016 1419   LABSPEC 1.010 03/16/2016 1419   PHURINE 6.0 03/16/2016 1419   GLUCOSEU NEGATIVE 03/16/2016 1419   HGBUR NEGATIVE 03/16/2016 1419   BILIRUBINUR NEGATIVE 03/16/2016 1419   KETONESUR NEGATIVE 03/16/2016 1419   PROTEINUR NEGATIVE 03/16/2016 1419   NITRITE NEGATIVE 03/16/2016 1419   LEUKOCYTESUR NEGATIVE 03/16/2016 1419    Creatinine Clearance: Estimated Creatinine Clearance: 40.5 mL/min (A) (by C-G formula based on SCr of 1.45 mg/dL (H)).  Sepsis Labs: @LABRCNTIP (procalcitonin:4,lacticidven:4) )No results found for this or any previous visit (from the past 240 hour(s)).   Radiological Exams on Admission: Ct Abdomen Pelvis W Contrast  Result Date: 01/29/2017 CLINICAL DATA:  Left lower quadrant pain with rectal bleeding. History of nephrolithiasis, hepatitis-C, gastroesophageal reflux disease. EXAM: CT ABDOMEN AND PELVIS WITH CONTRAST TECHNIQUE: Multidetector CT imaging of the abdomen and pelvis was performed using the standard protocol following bolus administration of intravenous contrast. CONTRAST:  162mL ISOVUE-300 IOPAMIDOL (ISOVUE-300) INJECTION 61% COMPARISON:  10/17/2015 FINDINGS: Lower chest: Postoperative changes in the  mediastinum. Aortic valve prosthesis. Lung bases are clear. Hepatobiliary: Cholelithiasis with several large stones in the gallbladder. No gallbladder wall thickening or edema. No bile duct dilatation. No focal liver lesions. Pancreas: Unremarkable. No pancreatic ductal dilatation or surrounding inflammatory changes. Spleen: Normal in size without focal abnormality. Adrenals/Urinary Tract: No adrenal gland nodules. Bilateral renal parenchymal cysts. Nephrograms are  symmetrical. No hydronephrosis or hydroureter. Bladder wall is not thickened. Stomach/Bowel: Stomach, small bowel, and colon are mostly decompressed. No inflammatory infiltration is identified. Appendix is normal. Vascular/Lymphatic: Aortic atherosclerosis. No enlarged abdominal or pelvic lymph nodes. Reproductive: Prostate is unremarkable. Other: No abdominal wall hernia or abnormality. No abdominopelvic ascites. Musculoskeletal: No acute or significant osseous findings. IMPRESSION: Cholelithiasis without changes of cholecystitis. No evidence of bowel obstruction or inflammation. No renal or ureteral obstruction. Electronically Signed   By: Lucienne Capers M.D.   On: 01/29/2017 04:15    EKG: Independently reviewed. Sinus rhythm LVH with secondary repolarization abnormality Prolonged QT interval ST depression in the lateral and inferior leads - more pronounced peaked t waves - new  Assessment/Plan Principal Problem:   Rectal bleeding Active Problems:   Chronic hepatitis C without hepatic coma (HCC)   Chronic cluster headache, not intractable   Essential hypertension   Protein-calorie malnutrition, severe   Hypokalemia   CAD in native artery   S/P aortic valve replacement with bioprosthetic valve   Acute kidney injury (HCC)   Hyperglycemia   Rectal bleed   #1. Rectal bleeding. Not on anticoagulation. Reports having had a colonoscopy last year that was "okay. Hemoglobin is stable. He's had one episode while in the emergency department.  stool heme-positive. -Admit to telemetry -Serial CBCs -Monitor bowel movements -Obtain a PT/INR -Bowel rest -GI consult  #2. Hyperglycemia. Serum glucose 141 on admission. No history of diabetes. Not on steroids. -Obtain a hemoglobin A1c -Monitor  #3. Acute kidney injury. Serum reacted 1.45 on admission. 11 months ago creatinine was within the limits of normal. Likely related to #1. -Admit to telemetry -Gentle IV fluids -Hold nephrotoxins -Monitor urine output -Recheck in the morning  #4. Hypokalemia. Mild. Potassium level 3.4. Magnesium level 1.6. -Replete potassium and magnesium -Obtain a magnesium level -Recheck in the morning  #5. Hypertension. Chart review indicates patient's blood pressure is difficult to control. Home medications include amlodipine, Coreg, Cardura, lisinopril -Hold lisinopril for now -Continue amlodipine and Coreg and Cardura -Hydralazine when necessary -Monitor closely  #6. CAD/aortic insufficiency status post valve replacement 2017. Chest pain. Chart review indicates visit with cardiothoracic surgery 6 months ago. -Blood pressure control -Continue home meds  DVT prophylaxis: scd Code Status:  full  Family Communication: none present  Disposition Plan: home  Consults called: gi  Admission status: inpatient   Radene Gunning MD [facility]  If 7PM-7AM, please contact night-coverage [email or web address] Password [facility]  01/29/2017, 8:57 AM

## 2017-01-29 NOTE — ED Notes (Signed)
Pt continues to complain

## 2017-01-30 DIAGNOSIS — N179 Acute kidney failure, unspecified: Secondary | ICD-10-CM | POA: Diagnosis not present

## 2017-01-30 DIAGNOSIS — K625 Hemorrhage of anus and rectum: Secondary | ICD-10-CM

## 2017-01-30 LAB — CBC
HCT: 29 % — ABNORMAL LOW (ref 39.0–52.0)
Hemoglobin: 9.8 g/dL — ABNORMAL LOW (ref 13.0–17.0)
MCH: 30.8 pg (ref 26.0–34.0)
MCHC: 33.8 g/dL (ref 30.0–36.0)
MCV: 91.2 fL (ref 78.0–100.0)
PLATELETS: 193 10*3/uL (ref 150–400)
RBC: 3.18 MIL/uL — AB (ref 4.22–5.81)
RDW: 13.8 % (ref 11.5–15.5)
WBC: 9.7 10*3/uL (ref 4.0–10.5)

## 2017-01-30 LAB — BASIC METABOLIC PANEL
Anion gap: 9 (ref 5–15)
BUN: 17 mg/dL (ref 6–20)
CALCIUM: 8.7 mg/dL — AB (ref 8.9–10.3)
CO2: 22 mmol/L (ref 22–32)
CREATININE: 0.9 mg/dL (ref 0.61–1.24)
Chloride: 109 mmol/L (ref 101–111)
GFR calc non Af Amer: >60 mL/min (ref >60)
GLUCOSE: 114 mg/dL — AB (ref 65–99)
Potassium: 3.7 mmol/L (ref 3.5–5.1)
Sodium: 140 mmol/L (ref 135–145)

## 2017-01-30 MED ORDER — BUTALBITAL-APAP-CAFFEINE 50-325-40 MG PO TABS
1.0000 | ORAL_TABLET | Freq: Once | ORAL | Status: AC
  Administered 2017-01-30: 1 via ORAL
  Filled 2017-01-30: qty 1

## 2017-01-30 NOTE — Discharge Summary (Signed)
Physician Discharge Summary  Bryker Fletchall ZOX:[id number] DOB: 07/09/1951 DOA: 01/28/2017  PCP: System, Pcp Not In  Admit date: 01/28/2017 Discharge date: 01/30/2017  Admitted From:home Disposition:home  Recommendations for Outpatient Follow-up:  1. Follow up with PCP in 1-2 weeks 2. Please obtain BMP/CBC in one week   Home Health: no Equipment/Devices:no Discharge Condition:stable CODE STATUS:full Diet recommendation:heart healthy  Brief/Interim Summary: 66 year old male with history of chronic systolic heart failure, aortic insufficiency status post valve replacement in 2017, hypertension, seizure disorder, nephrolithiasis, hepatitis C, acid reflux presented with episode of rectal bleeding. Patient denied headache, dizziness, chest pain or shortness of breath. He was evaluated by GI. CT scan of abdomen and pelvis consistent with cholelithiasis without cholecystitis. No acute finding. Patient with no further bleeding in the hospital. Hemoglobin is around baseline. Although his hemoglobin on admission was high which is likely due to hemoconcentration. Acute kidney injury on admission likely hemodynamically mediated which was improved with IV fluid. Patient was recommended to follow-up with his PCP and have repeat lab done. He verbalized understanding. He has no nausea vomiting or abdominal pain. Denied any GI bleeding. He is medically stable today. I discussed with the GI Dr. Therisa Doyne.   Patient was noticed to have borderline low blood pressure this morning. He reported that he is on antihypertensive medications for a long time and he has been doing well. I recommended him to follow-up with his PCP and continue to monitor blood pressure home.  Discharge Diagnoses:  Principal Problem:   Rectal bleeding Active Problems:   Chronic hepatitis C without hepatic coma (HCC)   Chronic cluster headache, not intractable   Essential hypertension   Protein-calorie malnutrition, severe   Hypokalemia    CAD in native artery   S/P aortic valve replacement with bioprosthetic valve   Acute kidney injury ([place])   Hyperglycemia   Rectal bleed    Discharge Instructions  Discharge Instructions    Call MD for:  difficulty breathing, headache or visual disturbances    Complete by:  As directed    Call MD for:  extreme fatigue    Complete by:  As directed    Call MD for:  hives    Complete by:  As directed    Call MD for:  persistant dizziness or light-headedness    Complete by:  As directed    Call MD for:  persistant nausea and vomiting    Complete by:  As directed    Call MD for:  severe uncontrolled pain    Complete by:  As directed    Call MD for:  temperature >100.4    Complete by:  As directed    Diet - low sodium heart healthy    Complete by:  As directed    Discharge instructions    Complete by:  As directed    Please follow up with your PCP in 1 week. Also follow up with your GI or [facility] in 1 month. Take fiber food and check blood/CBC in a week with her PCP. Monitor blood pressure at home.   Increase activity slowly    Complete by:  As directed      Allergies as of 01/30/2017      Reactions   No Known Allergies       Medication List    STOP taking these medications   amoxicillin 500 MG tablet Commonly known as:  AMOXIL     TAKE these medications   amLODipine 10 MG tablet Commonly known as:  NORVASC Take 1 tablet (10 mg total) by mouth daily.   aspirin 325 MG EC tablet Take 1 tablet (325 mg total) by mouth daily.   atorvastatin 10 MG tablet Commonly known as:  LIPITOR Take 1 tablet (10 mg total) by mouth daily at 6 PM.   butalbital-aspirin-caffeine 50-325-40 MG capsule Commonly known as:  FIORINAL Take 1 capsule by mouth every 6 (six) hours as needed for headache.   carvedilol 25 MG tablet Commonly known as:  COREG Take 1 tablet (25 mg total) by mouth 2 (two) times daily.   doxazosin 2 MG tablet Commonly known as:  CARDURA Take 1 tablet (2 mg  total) by mouth at bedtime.   lisinopril 20 MG tablet Commonly known as:  PRINIVIL,ZESTRIL Take 20 mg by mouth daily.   promethazine 25 MG tablet Commonly known as:  PHENERGAN Take 25 mg by mouth every 6 (six) hours as needed for nausea or vomiting.   SUMAtriptan 100 MG tablet Commonly known as:  IMITREX Take 100 mg by mouth every 2 (two) hours as needed for migraine. May repeat in 2 hours if headache persists or recurs.      Follow-up Information    Ronnette Juniper, MD Follow up in 1 month(s).   Specialty:  Gastroenterology Why:  or follow up with your GI physician. Contact information: [address] [place] [address] [phone]          Allergies  Allergen Reactions  . No Known Allergies     Consultations: GI  Procedures/Studies: CT scan of abdomen and pelvis  Subjective: Denied headache, dizziness, nausea, vomiting, chest, shortness of breath. No abdominal pain. No GI bleed.  Discharge Exam: Vitals:   01/30/17 0520 01/30/17 0919  BP: 114/70 109/83  Pulse: 78 82  Resp:    Temp: 98.2 F (36.8 C) 98.2 F (36.8 C)   Vitals:   01/29/17 1050 01/29/17 2015 01/30/17 0520 01/30/17 0919  BP: (!) 146/95 116/84 114/70 109/83  Pulse: 90 91 78 82  Resp:      Temp: 98.3 F (36.8 C) 98.3 F (36.8 C) 98.2 F (36.8 C) 98.2 F (36.8 C)  TempSrc: Oral Oral Oral Oral  SpO2: 100% 100% 100% 100%  Weight:  57.2 kg (126 lb 1.7 oz)    Height:        General: Pt is alert, awake, not in acute distress Cardiovascular: RRR, S1/S2 +, no rubs, no gallops Respiratory: CTA bilaterally, no wheezing, no rhonchi Abdominal: Soft, NT, ND, bowel sounds + Extremities: no edema, no cyanosis    The results of significant diagnostics from this hospitalization (including imaging, microbiology, ancillary and laboratory) are listed below for reference.     Microbiology: No results found for this or any previous visit (from the past 240 hour(s)).   Labs: BNP (last  3 results)  Recent Labs  02/05/16 1421  BNP >6,433.2*   Basic Metabolic Panel:  Recent Labs Lab 01/29/17 0055 01/29/17 0059 01/30/17 0613  NA  --  141 140  K  --  3.4* 3.7  CL  --  107 109  CO2  --  18* 22  GLUCOSE  --  141* 114*  BUN  --  14 17  CREATININE 1.41* 1.45* 0.90  CALCIUM  --  9.4 8.7*  MG 1.6*  --   --   PHOS 1.2*  --   --    Liver Function Tests: No results for input(s): AST, ALT, ALKPHOS, BILITOT, PROT, ALBUMIN in the  last 168 hours. No results for input(s): LIPASE, AMYLASE in the last 168 hours. No results for input(s): AMMONIA in the last 168 hours. CBC:  Recent Labs Lab 01/29/17 0059 01/29/17 1611 01/30/17 0613  WBC 13.2* 14.0* 9.7  NEUTROABS 9.3*  --   --   HGB 12.3* 10.5* 9.8*  HCT 35.8* 31.3* 29.0*  MCV 89.9 91.3 91.2  PLT 242 217 193   Cardiac Enzymes: No results for input(s): CKTOTAL, CKMB, CKMBINDEX, TROPONINI in the last 168 hours. BNP: Invalid input(s): POCBNP CBG: No results for input(s): GLUCAP in the last 168 hours. D-Dimer No results for input(s): DDIMER in the last 72 hours. Hgb A1c No results for input(s): HGBA1C in the last 72 hours. Lipid Profile No results for input(s): CHOL, HDL, LDLCALC, TRIG, CHOLHDL, LDLDIRECT in the last 72 hours. Thyroid function studies No results for input(s): TSH, T4TOTAL, T3FREE, THYROIDAB in the last 72 hours.  Invalid input(s): FREET3 Anemia work up No results for input(s): VITAMINB12, FOLATE, FERRITIN, TIBC, IRON, RETICCTPCT in the last 72 hours. Urinalysis    Component Value Date/Time   COLORURINE YELLOW 01/29/2017 1212   APPEARANCEUR HAZY (A) 01/29/2017 1212   LABSPEC >1.046 (H) 01/29/2017 1212   PHURINE 5.0 01/29/2017 1212   GLUCOSEU NEGATIVE 01/29/2017 1212   HGBUR NEGATIVE 01/29/2017 1212   BILIRUBINUR NEGATIVE 01/29/2017 1212   KETONESUR 5 (A) 01/29/2017 1212   PROTEINUR NEGATIVE 01/29/2017 1212   NITRITE NEGATIVE 01/29/2017 1212   LEUKOCYTESUR NEGATIVE 01/29/2017 1212    Sepsis Labs Invalid input(s): PROCALCITONIN,  WBC,  LACTICIDVEN Microbiology No results found for this or any previous visit (from the past 240 hour(s)).   Time coordinating discharge: 28 minutes  SIGNED:   Rosita Fire, MD  [facility] 01/30/2017, 11:23 AM  If 7PM-7AM, please contact night-coverage [email or web address] Password [email or web address]

## 2017-01-30 NOTE — Progress Notes (Signed)
Subjective: Patient was seen and examined at bedside. He has not had a bowel movement in last 24 hours. He denies any abdominal pain, nausea or vomiting and has been tolerating clear liquid diet  Objective: Vital signs in last 24 hours: Temp:  [98.2 F (36.8 C)-98.3 F (36.8 C)] 98.2 F (36.8 C) (05/27 0919) Pulse Rate:  [78-91] 82 (05/27 0919) BP: (109-146)/(70-95) 109/83 (05/27 0919) SpO2:  [100 %] 100 % (05/27 0919) Weight:  [57.2 kg (126 lb 1.7 oz)] 57.2 kg (126 lb 1.7 oz) (05/26 2015) Weight change: -0.86 kg (-1 lb 14.4 oz) Last BM Date: 01/29/17  PE: Sitting on chair at bedside, appears comfortable GENERAL: Pallor, no obvious icterus, not in acute distress ABDOMEN: Soft, nondistended, nontender, normoactive bowel sounds EXTREMITIES: No deformity, no clubbing  Lab Results: Results for orders placed or performed during the hospital encounter of 01/28/17 (from the past 48 hour(s))  POC occult blood, ED     Status: Abnormal   Collection Time: 01/29/17 12:08 AM  Result Value Ref Range   Fecal Occult Bld POSITIVE (A) NEGATIVE  Type and screen     Status: None   Collection Time: 01/29/17 12:55 AM  Result Value Ref Range   ABO/RH(D) B POS    Antibody Screen NEG    Sample Expiration 02/01/2017   Creatinine, serum     Status: Abnormal   Collection Time: 01/29/17 12:55 AM  Result Value Ref Range   Creatinine, Ser 1.41 (H) 0.61 - 1.24 mg/dL   GFR calc non Af Amer 50 (L) >60 mL/min   GFR calc Af Amer 58 (L) >60 mL/min    Comment: (NOTE) The eGFR has been calculated using the CKD EPI equation. This calculation has not been validated in all clinical situations. eGFR's persistently <60 mL/min signify possible Chronic Kidney Disease.   Magnesium     Status: Abnormal   Collection Time: 01/29/17 12:55 AM  Result Value Ref Range   Magnesium 1.6 (L) 1.7 - 2.4 mg/dL  Phosphorus     Status: Abnormal   Collection Time: 01/29/17 12:55 AM  Result Value Ref Range   Phosphorus 1.2 (L)  2.5 - 4.6 mg/dL  CBC with Differential/Platelet     Status: Abnormal   Collection Time: 01/29/17 12:59 AM  Result Value Ref Range   WBC 13.2 (H) 4.0 - 10.5 K/uL   RBC 3.98 (L) 4.22 - 5.81 MIL/uL   Hemoglobin 12.3 (L) 13.0 - 17.0 g/dL   HCT 35.8 (L) 39.0 - 52.0 %   MCV 89.9 78.0 - 100.0 fL   MCH 30.9 26.0 - 34.0 pg   MCHC 34.4 30.0 - 36.0 g/dL   RDW 13.5 11.5 - 15.5 %   Platelets 242 150 - 400 K/uL   Neutrophils Relative % 71 %   Neutro Abs 9.3 (H) 1.7 - 7.7 K/uL   Lymphocytes Relative 21 %   Lymphs Abs 2.8 0.7 - 4.0 K/uL   Monocytes Relative 7 %   Monocytes Absolute 1.0 0.1 - 1.0 K/uL   Eosinophils Relative 1 %   Eosinophils Absolute 0.1 0.0 - 0.7 K/uL   Basophils Relative 0 %   Basophils Absolute 0.0 0.0 - 0.1 K/uL  Basic metabolic panel     Status: Abnormal   Collection Time: 01/29/17 12:59 AM  Result Value Ref Range   Sodium 141 135 - 145 mmol/L   Potassium 3.4 (L) 3.5 - 5.1 mmol/L   Chloride 107 101 - 111 mmol/L   CO2 18 (L) 22 -  32 mmol/L   Glucose, Bld 141 (H) 65 - 99 mg/dL   BUN 14 6 - 20 mg/dL   Creatinine, Ser 1.45 (H) 0.61 - 1.24 mg/dL   Calcium 9.4 8.9 - 10.3 mg/dL   GFR calc non Af Amer 49 (L) >60 mL/min   GFR calc Af Amer 56 (L) >60 mL/min    Comment: (NOTE) The eGFR has been calculated using the CKD EPI equation. This calculation has not been validated in all clinical situations. eGFR's persistently <60 mL/min signify possible Chronic Kidney Disease.    Anion gap 16 (H) 5 - 15  Protime-INR     Status: None   Collection Time: 01/29/17 10:13 AM  Result Value Ref Range   Prothrombin Time 14.3 11.4 - 15.2 seconds   INR 1.10   Urinalysis, Routine w reflex microscopic     Status: Abnormal   Collection Time: 01/29/17 12:12 PM  Result Value Ref Range   Color, Urine YELLOW YELLOW   APPearance HAZY (A) CLEAR   Specific Gravity, Urine >1.046 (H) 1.005 - 1.030   pH 5.0 5.0 - 8.0   Glucose, UA NEGATIVE NEGATIVE mg/dL   Hgb urine dipstick NEGATIVE NEGATIVE    Bilirubin Urine NEGATIVE NEGATIVE   Ketones, ur 5 (A) NEGATIVE mg/dL   Protein, ur NEGATIVE NEGATIVE mg/dL   Nitrite NEGATIVE NEGATIVE   Leukocytes, UA NEGATIVE NEGATIVE  CBC     Status: Abnormal   Collection Time: 01/29/17  4:11 PM  Result Value Ref Range   WBC 14.0 (H) 4.0 - 10.5 K/uL   RBC 3.43 (L) 4.22 - 5.81 MIL/uL   Hemoglobin 10.5 (L) 13.0 - 17.0 g/dL   HCT 31.3 (L) 39.0 - 52.0 %   MCV 91.3 78.0 - 100.0 fL   MCH 30.6 26.0 - 34.0 pg   MCHC 33.5 30.0 - 36.0 g/dL   RDW 13.8 11.5 - 15.5 %   Platelets 217 150 - 400 K/uL  Basic metabolic panel     Status: Abnormal   Collection Time: 01/30/17  6:13 AM  Result Value Ref Range   Sodium 140 135 - 145 mmol/L   Potassium 3.7 3.5 - 5.1 mmol/L   Chloride 109 101 - 111 mmol/L   CO2 22 22 - 32 mmol/L   Glucose, Bld 114 (H) 65 - 99 mg/dL   BUN 17 6 - 20 mg/dL   Creatinine, Ser 0.90 0.61 - 1.24 mg/dL   Calcium 8.7 (L) 8.9 - 10.3 mg/dL   GFR calc non Af Amer >60 >60 mL/min   GFR calc Af Amer >60 >60 mL/min    Comment: (NOTE) The eGFR has been calculated using the CKD EPI equation. This calculation has not been validated in all clinical situations. eGFR's persistently <60 mL/min signify possible Chronic Kidney Disease.    Anion gap 9 5 - 15  CBC     Status: Abnormal   Collection Time: 01/30/17  6:13 AM  Result Value Ref Range   WBC 9.7 4.0 - 10.5 K/uL   RBC 3.18 (L) 4.22 - 5.81 MIL/uL   Hemoglobin 9.8 (L) 13.0 - 17.0 g/dL   HCT 29.0 (L) 39.0 - 52.0 %   MCV 91.2 78.0 - 100.0 fL   MCH 30.8 26.0 - 34.0 pg   MCHC 33.8 30.0 - 36.0 g/dL   RDW 13.8 11.5 - 15.5 %   Platelets 193 150 - 400 K/uL    Studies/Results: Ct Abdomen Pelvis W Contrast  Result Date: 01/29/2017 CLINICAL DATA:  Left  lower quadrant pain with rectal bleeding. History of nephrolithiasis, hepatitis-C, gastroesophageal reflux disease. EXAM: CT ABDOMEN AND PELVIS WITH CONTRAST TECHNIQUE: Multidetector CT imaging of the abdomen and pelvis was performed using the  standard protocol following bolus administration of intravenous contrast. CONTRAST:  155m ISOVUE-300 IOPAMIDOL (ISOVUE-300) INJECTION 61% COMPARISON:  10/17/2015 FINDINGS: Lower chest: Postoperative changes in the mediastinum. Aortic valve prosthesis. Lung bases are clear. Hepatobiliary: Cholelithiasis with several large stones in the gallbladder. No gallbladder wall thickening or edema. No bile duct dilatation. No focal liver lesions. Pancreas: Unremarkable. No pancreatic ductal dilatation or surrounding inflammatory changes. Spleen: Normal in size without focal abnormality. Adrenals/Urinary Tract: No adrenal gland nodules. Bilateral renal parenchymal cysts. Nephrograms are symmetrical. No hydronephrosis or hydroureter. Bladder wall is not thickened. Stomach/Bowel: Stomach, small bowel, and colon are mostly decompressed. No inflammatory infiltration is identified. Appendix is normal. Vascular/Lymphatic: Aortic atherosclerosis. No enlarged abdominal or pelvic lymph nodes. Reproductive: Prostate is unremarkable. Other: No abdominal wall hernia or abnormality. No abdominopelvic ascites. Musculoskeletal: No acute or significant osseous findings. IMPRESSION: Cholelithiasis without changes of cholecystitis. No evidence of bowel obstruction or inflammation. No renal or ureteral obstruction. Electronically Signed   By: WLucienne CapersM.D.   On: 01/29/2017 04:15    Medications: I have reviewed the patient's current medications.  Assessment: Painless rectal bleeding, total 6 episodes prior to presentation to ER, resolved  Plan: I reviewed the CAT scan, which shows no evidence of diverticulosis, but the colon is mostly decompressed and underlying diverticulosis as such cannot be ruled out. Patient remains hemodynamically stable, is not required any blood transfusions. Hemoglobin on presentation was 12.3 however his baseline hemoglobin from July 2017 was 9.6. His hemoglobin yesterday and today and 10.5 and 9.8  respectively. Recommend advance diet to high-fiber, if remains hemodynamically stable plan workup as an outpatient. Advised patient to use bulk forming laxatives such as Metamucil or Benefiber on a regular basis. Discussed with patient's hospitalist Dr. BCarolin Sicks    ARonnette Juniper5/27/2018, 9:50 AM   Pager [phone] no answer or after 5 PM call [phone]

## 2017-01-30 NOTE — Care Management Obs Status (Signed)
[place] NOTIFICATION   Patient Details  Name: Alexis [name] MRN: [id number] Date of Birth: Jul 20, 1951   Medicare Observation Status Notification Given:  Yes    Dellie Catholic, RN 01/30/2017, 2:57 PM

## 2017-01-30 NOTE — Progress Notes (Signed)
Discharge instructions and medications discussed with patient.  All questions answered.  

## 2017-01-30 NOTE — Care Management CC44 (Signed)
Condition Code 44 Documentation Completed  Patient Details  Name: Alexis [name] MRN: [id number] Date of Birth: 1950/10/26   Condition Code 44 given:  Yes Patient signature on Condition Code 44 notice:  Yes Documentation of 2 MD's agreement:  Yes Code 44 added to claim:  Yes    Dellie Catholic, RN 01/30/2017, 2:58 PM

## 2017-03-28 ENCOUNTER — Encounter: Payer: Self-pay | Admitting: Thoracic Surgery (Cardiothoracic Vascular Surgery)

## 2017-04-25 ENCOUNTER — Encounter: Payer: Medicare HMO | Admitting: Thoracic Surgery (Cardiothoracic Vascular Surgery)

## 2017-04-25 NOTE — Progress Notes (Signed)
This encounter was created in error - please disregard.

## 2017-05-23 ENCOUNTER — Encounter: Payer: Medicare HMO | Admitting: Thoracic Surgery (Cardiothoracic Vascular Surgery)

## 2017-05-30 ENCOUNTER — Encounter: Payer: Medicare HMO | Admitting: Thoracic Surgery (Cardiothoracic Vascular Surgery)

## 2019-04-12 ENCOUNTER — Telehealth: Payer: Self-pay | Admitting: Cardiovascular Disease

## 2019-04-12 NOTE — Telephone Encounter (Signed)
Recall - lmtcb

## 2020-01-15 ENCOUNTER — Ambulatory Visit: Payer: Medicare HMO | Attending: Internal Medicine

## 2020-01-15 DIAGNOSIS — Z23 Encounter for immunization: Secondary | ICD-10-CM

## 2020-01-15 NOTE — Progress Notes (Signed)
   Covid-19 Vaccination Clinic  Name:  Syon Winterhalter    MRN: [id number] DOB: 17-Mar-1951  01/15/2020  Mr. Arnet was observed post Covid-19 immunization for 15 minutes without incident. He was provided with Vaccine Information Sheet and instruction to access the V-Safe system.   Mr. Plucinski was instructed to call 911 with any severe reactions post vaccine: Marland Kitchen Difficulty breathing  . Swelling of face and throat  . A fast heartbeat  . A bad rash all over body  . Dizziness and weakness   Immunizations Administered    Name Date Dose VIS Date Route   Moderna COVID-19 Vaccine 01/15/2020 12:12 PM 0.5 mL 08/2019 Intramuscular   Manufacturer: Moderna   LotCA:[id number]   [place]:[id number]

## 2020-02-12 ENCOUNTER — Ambulatory Visit: Payer: Self-pay | Attending: Internal Medicine

## 2020-12-05 DEATH — deceased

## 2022-03-06 DEATH — deceased
